# Patient Record
Sex: Female | Born: 1965
Health system: Western US, Academic
[De-identification: ages and names within clinical notes are randomized; demographics above are authoritative.]

## PROBLEM LIST (undated history)

## (undated) DIAGNOSIS — I10 Essential (primary) hypertension: Secondary | ICD-10-CM

## (undated) DIAGNOSIS — R7303 Prediabetes: Secondary | ICD-10-CM

## (undated) DIAGNOSIS — T7840XA Allergy, unspecified, initial encounter: Secondary | ICD-10-CM

## (undated) DIAGNOSIS — K219 Gastro-esophageal reflux disease without esophagitis: Secondary | ICD-10-CM

## (undated) HISTORY — DX: Prediabetes: R73.03

## (undated) HISTORY — DX: Essential (primary) hypertension: I10

## (undated) HISTORY — PX: CHOLECYSTECTOMY: SHX55

## (undated) HISTORY — DX: Allergy, unspecified, initial encounter: T78.40XA

## (undated) HISTORY — PX: HERNIA REPAIR: SHX51

## (undated) HISTORY — DX: Gastro-esophageal reflux disease without esophagitis: K21.9

## (undated) HISTORY — PX: SHOULDER ARTHROSCOPY W/ ROTATOR CUFF REPAIR: SHX2400

## (undated) HISTORY — PX: NOSE SURGERY: SHX723

## (undated) HISTORY — PX: PB RPR SPIGELIAN HERNIA: 49590

## (undated) HISTORY — PX: TUBAL LIGATION: SHX77

## (undated) MED ORDER — LACTATED RINGERS IV SOLN
INTRAVENOUS | Status: AC
Start: 2014-08-30 — End: ?

## (undated) MED ORDER — PB-HYOSCY-ATROPINE-SCOPOLAMINE 16.2 MG/5ML PO ELIX
10.00 mL | ORAL_SOLUTION | Freq: Once | ORAL | Status: AC
Start: 2014-05-21 — End: 2014-05-21

## (undated) MED ORDER — ALUM & MAG HYDROXIDE-SIMETH 200-200-20 MG/5ML OR SUSP
30.00 mL | Freq: Once | ORAL | Status: AC
Start: 2014-05-21 — End: 2014-05-21

## (undated) MED ORDER — LIDOCAINE VISCOUS 2 % MT SOLN
10.00 mL | Freq: Once | OROMUCOSAL | Status: AC
Start: 2014-05-21 — End: 2014-05-21

## (undated) MED ORDER — LIDOCAINE HCL 1 % IJ SOLN
0.10 mL | INTRAMUSCULAR | Status: AC | PRN
Start: 2014-08-30 — End: ?

---

## 2014-02-28 ENCOUNTER — Emergency Department
Admission: EM | Admit: 2014-02-28 | Discharge: 2014-02-28 | Disposition: A | Discharge status: Home / Self Care | Payer: Medicaid Other | Attending: Emergency Medicine | Admitting: Emergency Medicine

## 2014-02-28 DIAGNOSIS — R229 Localized swelling, mass and lump, unspecified: Principal | ICD-10-CM | POA: Insufficient documentation

## 2014-02-28 DIAGNOSIS — H5789 Other specified disorders of eye and adnexa: Secondary | ICD-10-CM

## 2014-02-28 DIAGNOSIS — I10 Essential (primary) hypertension: Secondary | ICD-10-CM | POA: Insufficient documentation

## 2014-02-28 DIAGNOSIS — T7840XA Allergy, unspecified, initial encounter: Secondary | ICD-10-CM

## 2014-02-28 MED ORDER — PREDNISONE 5 MG OR TABS
50.0000 mg | ORAL_TABLET | Freq: Once | ORAL | Status: DC
Start: 2014-02-28 — End: 2014-02-28

## 2014-02-28 MED ORDER — HYDROCORTISONE 0.5 % EX CREA
1.0000 | TOPICAL_CREAM | Freq: Two times a day (BID) | CUTANEOUS | Status: DC
Start: 2014-02-28 — End: 2015-02-27

## 2014-02-28 MED ORDER — DIPHENHYDRAMINE HCL 25 MG OR TABS OR CAPS CUSTOM
25.0000 mg | ORAL_CAPSULE | Freq: Four times a day (QID) | ORAL | Status: DC | PRN
Start: 2014-02-28 — End: 2015-02-27

## 2014-02-28 MED ORDER — DIPHENHYDRAMINE HCL 25 MG OR TABS OR CAPS CUSTOM
25.0000 mg | ORAL_CAPSULE | Freq: Once | ORAL | Status: AC
Start: 2014-02-28 — End: 2014-02-28
  Administered 2014-02-28: 25 mg via ORAL
  Filled 2014-02-28: qty 1

## 2014-02-28 NOTE — Discharge Instructions (Signed)
Allergic Reaction     You have been seen for an allergic reaction.     An allergic reaction is when your body reacts to something it comes in contact with. This can be something you ate. It can also be something that got on your skin or that you breathed in. Insect bites sometimes cause this reaction. Wasp, hornet and bee stings often cause this reaction.     Allergic reactions can cause a few things to happen. Some people get hives (large raised welts). Others get blistered skin. More serious reactions include swelling of the lips and/or tongue. They also include difficulty breathing. This can be with or without wheezing.     Often, the exact cause of the allergic reaction is never found.     If you find you are allergic to something, avoid it. Future allergic reactions could get much worse.     General treatment for an allergic reaction includes antihistamines like diphenhydramine (Benadryl®) or prescription strength hydroxyzine (Atarax®) and steroids. Some antacids also act like antihistamines. These include famotidine (Pepcid®), ranitidine (Zantac®) or cimetidine (Tagamet®). These are often used to help with the allergic reaction.     If you get a steroid prescription, it is important to finish the entire prescription. The allergic reaction can come back suddenly (rebound) if you suddenly stop the steroid too early.     YOU SHOULD SEEK MEDICAL ATTENTION IMMEDIATELY, EITHER HERE OR AT THE NEAREST EMERGENCY DEPARTMENT, IF ANY OF THE FOLLOWING OCCURS:  · Your lips or tongue get swollen.  · You have trouble breathing or start wheezing.  · Your rash seems to get infected. Signs of infection are skin redness, pain, pus, swelling or fever (temperature higher than 100.4ºF / 38ºC).

## 2014-02-28 NOTE — ED MD Progress Note (Signed)
Patient actually just given rx for hydrocortisone cream and benadryl.  She did not want a systemic steroid to take as risks/benefits were discussed.  OK to be discharged.

## 2014-02-28 NOTE — ED Provider Notes (Signed)
Emergency Department Note    The Date of Service for the Emergency Room encounter is 02/28/2014  2:56 PM     CC :   Chief Complaint   Patient presents with    Facial Swelling     pt arrived c/o L eye and facial swelling that started yesterday.  reports having an allergic reaction to tea tree oil that was used on scalp 4 days ago and states "i think i touched my face after touching my hair."  airway patent.  no breathing difficulty.       HPI :   48 year old female with history of hypertension who presents with left upper facial swelling s/p tree oil extract exposure.  Started using tree oil extract with her hair grease last week.  It was causing dryness and scabbing in her scalp and she was picking through her hair yesterday.  She rubbed some around her eye yesterday and since then with swelling around the left eye with mild itching. No real pain to that area, no pain in the eye directly.  No vision changes, no problems moving eyes around, no eye redness.  She has no fevers/chills, headache, chest pain, throat/neck swelling, chest pain, SOB, cough, abdominal pain, nausea/vomiting, diarrhea, or focal weakness/numbness/tingling in extremities.  She has taken benadryl intermittently, last use was early in the morning.      Past Medical History:   Hypertension    Past Surgical history:   None    Allergies:  Pcn    Social History:  Tobacco: no  Etoh: occassional  Illicit, IV, rx drug abuse: no  Living situation: has place to live    Family history:  None    Review of Systems:  All other systems reviewed and deemed negative      Physical Exam:   4    02/28/14  1204 02/28/14  1451   BP: 155/104 161/94   Pulse: 80 78   Temp: 98.4 F (36.9 C) 98.4 F (36.9 C)   Resp: 16 16   SpO2: 99% 100%       General: AOx3; NAD  HEENT: PERRL; EOMI; no conjunctival erythema; swelling to the lower eyelid and lateral upper eyelid   Heart: RRR; no murmurs  Lungs: CTAB  Abd: soft, non-distended; BS present; no tenderness to palpation  Ext:  no swelling in extremities  Skin: no rash, vesicles, drainage observed      Clinical Decision Making:  48 year old female with history of hypertension who presents with left upper facial swelling s/p tree oil extract exposure.  Likely had an allergic reaction to the oil extract.  She does not have anaphylaxis clinically.  There is no involvement of the eye.  Given her clinical history, do not believe swelling is 2/2 infection such as pre-orbital cellulitis.  Will give steroids and benadryl.  Discussed return if not getting better.    Case discussed with the attending, Dr. Nikki Dom, Debroah Loop, MD  Resident  02/28/14 1518    Daphene Jaeger, MD  02/28/14 2107

## 2014-03-15 NOTE — ED Follow-up Note (Signed)
Follow-up type: Callback       Routine ED Patient Call Back    Patient unable to be contacted, no message left

## 2014-05-21 ENCOUNTER — Inpatient Hospital Stay
Admission: EM | Admit: 2014-05-21 | Discharge: 2014-05-22 | DRG: 282 | Disposition: A | Discharge status: Home / Self Care | Payer: Medicaid Other | Attending: Internal Medicine | Admitting: Internal Medicine

## 2014-05-21 DIAGNOSIS — K859 Acute pancreatitis without necrosis or infection, unspecified: Secondary | ICD-10-CM | POA: Diagnosis present

## 2014-05-21 DIAGNOSIS — E876 Hypokalemia: Secondary | ICD-10-CM | POA: Diagnosis present

## 2014-05-21 DIAGNOSIS — I10 Essential (primary) hypertension: Secondary | ICD-10-CM | POA: Diagnosis present

## 2014-05-21 DIAGNOSIS — K760 Fatty (change of) liver, not elsewhere classified: Secondary | ICD-10-CM

## 2014-05-21 DIAGNOSIS — K8020 Calculus of gallbladder without cholecystitis without obstruction: Secondary | ICD-10-CM

## 2014-05-21 LAB — CBC WITH DIFF, BLOOD
ANC-Automated: 4.4 10*3/uL (ref 1.6–7.0)
Abs Eosinophils: 0.2 10*3/uL (ref 0.1–0.7)
Abs Lymphs: 2.5 10*3/uL (ref 0.8–3.1)
Abs Monos: 0.5 10*3/uL (ref 0.2–0.8)
Eosinophils: 2 % (ref 1–4)
Hct: 39.6 % (ref 34.0–45.0)
Hgb: 13.8 gm/dL (ref 11.2–15.7)
Lymphocytes: 33 % (ref 19–53)
MCH: 30.3 pg (ref 26.0–32.0)
MCHC: 34.8 % (ref 32.0–36.0)
MCV: 87 um3 (ref 79.0–95.0)
MPV: 9 fL — ABNORMAL LOW (ref 9.4–12.4)
Monocytes: 7 % (ref 5–12)
Plt Count: 341 10*3/uL (ref 140–370)
RBC: 4.55 10*6/uL (ref 3.90–5.20)
RDW: 12.4 % (ref 12.0–14.0)
Segs: 57 % (ref 34–71)
WBC: 7.7 10*3/uL (ref 4.0–10.0)

## 2014-05-21 LAB — URINALYSIS
Bilirubin: NEGATIVE
Blood: NEGATIVE
Glucose: NEGATIVE
Leuk Esterase: NEGATIVE
Nitrite: NEGATIVE
Specific Gravity: 1.033 — ABNORMAL HIGH (ref 1.002–1.030)
Urobilinogen: NEGATIVE
pH: 6 (ref 5.0–8.0)

## 2014-05-21 LAB — COMPREHENSIVE METABOLIC PANEL, BLOOD
ALT (SGPT): 450 U/L — ABNORMAL HIGH (ref 0–33)
AST (SGOT): 227 U/L — ABNORMAL HIGH (ref 0–32)
Albumin: 4.5 g/dL (ref 3.5–5.2)
Alkaline Phos: 190 U/L — ABNORMAL HIGH (ref 35–140)
Anion Gap: 13 mmol/L (ref 7–15)
BUN: 12 mg/dL (ref 6–20)
Bicarbonate: 31 mmol/L — ABNORMAL HIGH (ref 22–29)
Bilirubin, Tot: 0.32 mg/dL (ref <1.20)
Calcium: 10 mg/dL (ref 8.5–10.6)
Chloride: 98 mmol/L (ref 98–107)
Creatinine: 0.59 mg/dL (ref 0.51–0.95)
GFR: >60 mL/min
Glucose: 97 mg/dL (ref 70–115)
Potassium: 3.4 mmol/L — ABNORMAL LOW (ref 3.5–5.1)
Sodium: 142 mmol/L (ref 136–145)
Total Protein: 7.8 g/dL (ref 6.0–8.0)

## 2014-05-21 LAB — LIPASE, BLOOD: Lipase: 1012 U/L — ABNORMAL HIGH (ref 13–60)

## 2014-05-21 MED ORDER — LIDOCAINE VISCOUS 2 % MT SOLN
10.0000 mL | Freq: Once | OROMUCOSAL | Status: AC
Start: 2014-05-21 — End: 2014-05-21
  Administered 2014-05-21: 10 mL via ORAL
  Filled 2014-05-21: qty 15

## 2014-05-21 MED ORDER — ALUM & MAG HYDROXIDE-SIMETH 200-200-20 MG/5ML OR SUSP
30.0000 mL | Freq: Once | ORAL | Status: AC
Start: 2014-05-21 — End: 2014-05-21
  Administered 2014-05-21: 30 mL via ORAL
  Filled 2014-05-21: qty 30

## 2014-05-21 MED ORDER — SODIUM CHLORIDE 0.9 % IV BOLUS
1000.0000 mL | INJECTION | Freq: Once | INTRAVENOUS | Status: AC
Start: 2014-05-21 — End: 2014-05-22
  Administered 2014-05-22: 1000 mL via INTRAVENOUS

## 2014-05-21 MED ORDER — ONDANSETRON 4 MG OR TBDP
4.0000 mg | ORAL_TABLET | Freq: Once | ORAL | Status: AC
Start: 2014-05-21 — End: 2014-05-21
  Administered 2014-05-21: 4 mg via ORAL
  Filled 2014-05-21: qty 1

## 2014-05-21 MED ORDER — PB-HYOSCY-ATROPINE-SCOPOLAMINE 16.2 MG/5ML PO ELIX
10.0000 mL | ORAL_SOLUTION | Freq: Once | ORAL | Status: AC
Start: 2014-05-21 — End: 2014-05-21
  Administered 2014-05-21: 10 mL via ORAL
  Filled 2014-05-21: qty 10

## 2014-05-21 MED ORDER — ONDANSETRON HCL 4 MG/2ML IV SOLN
4.0000 mg | Freq: Four times a day (QID) | INTRAMUSCULAR | Status: AC | PRN
Start: 2014-05-21 — End: 2014-05-22
  Administered 2014-05-21: 4 mg via INTRAVENOUS
  Filled 2014-05-21: qty 2

## 2014-05-21 MED ORDER — HYDROMORPHONE HCL 1 MG/ML IJ SOLN
1.0000 mg | INTRAMUSCULAR | Status: AC | PRN
Start: 2014-05-21 — End: 2014-05-22
  Filled 2014-05-21: qty 1

## 2014-05-21 NOTE — ED EKG Interpretation (Signed)
ED EKG Interpretation  Sinus @ 77bpm, normal axis, PR interval , QRS duration 30ms, QTc=462ms; TWI in III and T-wave flattening in aVF/V3/V4/V5/V6; No ST elevations/depressions

## 2014-05-21 NOTE — ED Notes (Signed)
Rainbow sent to lab

## 2014-05-21 NOTE — ED Notes (Signed)
12 lead EKG preformed. A copy was placed in the patient's chart and a second copy was handed to Dr. Minns.

## 2014-05-21 NOTE — ED EKG Interpretation (Signed)
ED EKG Interpretation    EKG: NSR, VR 77bpm, no axis deviation, PR and QRS wnl, QTc , no acute ST changes or TWI's, Q-waves in anterior leads, no previous ekg for comparison

## 2014-05-21 NOTE — ED Notes (Signed)
Pt to ultrasound via gurney.

## 2014-05-21 NOTE — ED Notes (Signed)
To ultrasound via gurney.

## 2014-05-21 NOTE — ED Notes (Signed)
Pt provided with crackers, 1 juice box and water. Okay by MD Mummert.

## 2014-05-21 NOTE — ED Notes (Signed)
Bed: 27  Expected date:   Expected time:   Means of arrival:   Comments:  Hold for 4B

## 2014-05-21 NOTE — ED Notes (Signed)
Report was given to P. Reeder Charity fundraiser.

## 2014-05-22 ENCOUNTER — Encounter (HOSPITAL_COMMUNITY): Payer: Self-pay | Admitting: Internal Medicine

## 2014-05-22 DIAGNOSIS — I10 Essential (primary) hypertension: Secondary | ICD-10-CM

## 2014-05-22 DIAGNOSIS — K859 Acute pancreatitis, unspecified: Principal | ICD-10-CM

## 2014-05-22 LAB — COMPREHENSIVE METABOLIC PANEL, BLOOD
ALT (SGPT): 289 U/L — ABNORMAL HIGH (ref 0–33)
AST (SGOT): 85 U/L — ABNORMAL HIGH (ref 0–32)
Albumin: 3.8 g/dL (ref 3.5–5.2)
Alkaline Phos: 147 U/L — ABNORMAL HIGH (ref 35–140)
Anion Gap: 14 mmol/L (ref 7–15)
BUN: 8 mg/dL (ref 6–20)
Bicarbonate: 25 mmol/L (ref 22–29)
Bilirubin, Tot: 0.47 mg/dL (ref <1.20)
Calcium: 8.4 mg/dL — ABNORMAL LOW (ref 8.5–10.6)
Chloride: 103 mmol/L (ref 98–107)
Creatinine: 0.56 mg/dL (ref 0.51–0.95)
GFR: >60 mL/min
Glucose: 99 mg/dL (ref 70–115)
Potassium: 2.9 mmol/L — ABNORMAL LOW (ref 3.5–5.1)
Sodium: 142 mmol/L (ref 136–145)
Total Protein: 6.7 g/dL (ref 6.0–8.0)

## 2014-05-22 LAB — BASIC METABOLIC PANEL, BLOOD
Anion Gap: 11 mmol/L (ref 7–15)
BUN: 9 mg/dL (ref 6–20)
Bicarbonate: 28 mmol/L (ref 22–29)
Calcium: 9.2 mg/dL (ref 8.5–10.6)
Chloride: 102 mmol/L (ref 98–107)
Creatinine: 0.6 mg/dL (ref 0.51–0.95)
GFR: >60 mL/min
Glucose: 117 mg/dL — ABNORMAL HIGH (ref 70–115)
Potassium: 3.8 mmol/L (ref 3.5–5.1)
Sodium: 141 mmol/L (ref 136–145)

## 2014-05-22 LAB — LIVER PANEL, BLOOD
ALT (SGPT): 238 U/L — ABNORMAL HIGH (ref 0–33)
AST (SGOT): 54 U/L — ABNORMAL HIGH (ref 0–32)
Albumin: 3.8 g/dL (ref 3.5–5.2)
Alkaline Phos: 133 U/L (ref 35–140)
Bilirubin, Dir: <0.2 mg/dL (ref <0.2)
Bilirubin, Tot: 0.2 mg/dL (ref <1.20)
Total Protein: 6.7 g/dL (ref 6.0–8.0)

## 2014-05-22 LAB — CBC WITH DIFF, BLOOD
ANC-Automated: 3.8 10*3/uL (ref 1.6–7.0)
Abs Eosinophils: 0.2 10*3/uL (ref 0.1–0.7)
Abs Lymphs: 2.5 10*3/uL (ref 0.8–3.1)
Abs Monos: 0.5 10*3/uL (ref 0.2–0.8)
Eosinophils: 2 % (ref 1–4)
Hct: 38.5 % (ref 34.0–45.0)
Hgb: 12.9 gm/dL (ref 11.2–15.7)
Lymphocytes: 35 % (ref 19–53)
MCH: 29.5 pg (ref 26.0–32.0)
MCHC: 33.5 % (ref 32.0–36.0)
MCV: 88.1 um3 (ref 79.0–95.0)
MPV: 9.5 fL (ref 9.4–12.4)
Monocytes: 7 % (ref 5–12)
Plt Count: 318 10*3/uL (ref 140–370)
RBC: 4.37 10*6/uL (ref 3.90–5.20)
RDW: 12.7 % (ref 12.0–14.0)
Segs: 55 % (ref 34–71)
WBC: 7 10*3/uL (ref 4.0–10.0)

## 2014-05-22 LAB — LIPASE, BLOOD: Lipase: 258 U/L — ABNORMAL HIGH (ref 13–60)

## 2014-05-22 LAB — TRIGLYCERIDES, BLOOD
Triglycerides: 104 mg/dL (ref 10–170)
Triglycerides: 105 mg/dL (ref 10–170)

## 2014-05-22 MED ORDER — NALOXONE HCL 0.4 MG/ML IJ SOLN
0.1000 mg | INTRAMUSCULAR | Status: DC | PRN
Start: 2014-05-22 — End: 2014-05-22

## 2014-05-22 MED ORDER — ONDANSETRON HCL 4 MG/2ML IV SOLN
4.0000 mg | Freq: Four times a day (QID) | INTRAMUSCULAR | Status: DC | PRN
Start: 2014-05-22 — End: 2014-05-22

## 2014-05-22 MED ORDER — MORPHINE SULFATE 2 MG/ML IJ SOLN
2.0000 mg | INTRAMUSCULAR | Status: DC | PRN
Start: 2014-05-22 — End: 2014-05-22
  Administered 2014-05-22: 2 mg via INTRAVENOUS

## 2014-05-22 MED ORDER — SODIUM CHLORIDE 0.9 % IJ SOLN (CUSTOM)
3.0000 mL | INTRAMUSCULAR | Status: DC | PRN
Start: 2014-05-22 — End: 2014-05-22

## 2014-05-22 MED ORDER — SODIUM CHLORIDE 0.9 % IV SOLN
INTRAVENOUS | Status: DC
Start: 2014-05-22 — End: 2014-05-22
  Administered 2014-05-22 (×3): via INTRAVENOUS

## 2014-05-22 MED ORDER — MORPHINE SULFATE 4 MG/ML IJ SOLN
4.0000 mg | INTRAMUSCULAR | Status: DC | PRN
Start: 2014-05-22 — End: 2014-05-22

## 2014-05-22 MED ORDER — SODIUM CHLORIDE 0.9 % IV BOLUS
1000.0000 mL | INJECTION | Freq: Once | INTRAVENOUS | Status: AC
Start: 2014-05-22 — End: 2014-05-22
  Administered 2014-05-22: 1000 mL via INTRAVENOUS

## 2014-05-22 MED ORDER — SODIUM CHLORIDE 0.9 % IJ SOLN (CUSTOM)
3.0000 mL | Freq: Three times a day (TID) | INTRAMUSCULAR | Status: DC
Start: 2014-05-22 — End: 2014-05-22

## 2014-05-22 MED ORDER — SODIUM CHLORIDE 0.9% TKO INFUSION
INTRAVENOUS | Status: DC | PRN
Start: 2014-05-22 — End: 2014-05-22

## 2014-05-22 MED ORDER — POTASSIUM CHLORIDE CRYS CR 10 MEQ OR TBCR
60.0000 meq | EXTENDED_RELEASE_TABLET | Freq: Once | ORAL | Status: AC
Start: 2014-05-22 — End: 2014-05-22
  Administered 2014-05-22: 60 meq via ORAL
  Filled 2014-05-22: qty 6

## 2014-05-22 MED ORDER — MORPHINE SULFATE 2 MG/ML IJ SOLN
2.0000 mg | INTRAMUSCULAR | Status: DC | PRN
Start: 2014-05-22 — End: 2014-05-22
  Filled 2014-05-22: qty 1

## 2014-05-22 MED ORDER — HYDROCHLOROTHIAZIDE 50 MG OR TABS
50.00 mg | ORAL_TABLET | Freq: Every day | ORAL | Status: DC
Start: ? — End: 2014-05-22

## 2014-05-22 NOTE — Plan of Care (Signed)
Problem: Discharge Planning  Goal: Participation in care planning  Outcome: Met  Labs redrawn and K improved from this am from 2.9 to 3.8. MD notified and d/c ordered. AVS provided to and reviewed with patient which included reason for admission, care and procedures provided while admitted, diet and activity to follow once discharged, s/s to be aware of and when to seek medical attention immediately, importance of f/u with general surgery clinic re: gallstones and f/u with PCP as discharge instructions state to d/c HCTZ. Pt verbalized understanding of all instructions. PIV removed. Pt states husband will be able to provide transportation home this evening.

## 2014-05-22 NOTE — Progress Notes (Signed)
Daily Progress Note:  05/22/2014     Current Hospital Stay:   1 day - Admitted on: 05/21/2014    Subjective:  Pt says she feels much better today.  Tolerating liquids then solids for lunch.  No abd pain, n/v, dyspnea.      Objective:    Vital Signs:  Temperature:  [97.1 F (36.2 C)-98.5 F (36.9 C)] 98.1 F (36.7 C) (03/12 0711)  Blood pressure (BP): (120-150)/(60-81) 125/78 mmHg (03/12 0711)  Heart Rate:  [62-94] 62 (03/12 0711)  Respirations:  [14-18] 18 (03/12 0711)  Pain Score:  [-] 0 (03/12 0711)  O2 Device:  [-] None (Room air) (03/12 0442)  SpO2:  [97 %-100 %] 97 % (03/12 0711)    Wt Readings from Last 1 Encounters:   05/22/14 103.874 kg (229 lb)       Intake/Output (Current Shift):         Physical Exam:  Psych; AAOx3, NAD   HENT:  mmm   Heart: regular rate, no murmurs appreciated   Lungs: Clear to ascultation bilaterally, symmetric rise  Abdomen: Soft, mild epigastric pain, +BS  Skin: warm, dry, no rashes     Laboratory data:   Lab Results   Component Value Date    NA 142 05/22/2014    K 2.9* 05/22/2014    CL 103 05/22/2014    BICARB 25 05/22/2014    BUN 8 05/22/2014    CREAT 0.56 05/22/2014    GLU 99 05/22/2014    Mertens 8.4* 05/22/2014     Lab Results   Component Value Date    WBC 7.0 05/22/2014    HGB 12.9 05/22/2014    HCT 38.5 05/22/2014    PLT 318 05/22/2014    SEG 55 05/22/2014    LYMPHS 35 05/22/2014    MONOS 7 05/22/2014    EOS 2 05/22/2014     Lab Results   Component Value Date    AST 85* 05/22/2014    ALT 289* 05/22/2014    ALK 147* 05/22/2014    TBILI 0.47 05/22/2014    TP 6.7 05/22/2014    ALB 3.8 05/22/2014     No results found for: INR, PTT  Lab Results   Component Value Date    PHUA 6.0 05/21/2014    SGUA 1.033* 05/21/2014    GLUCOSEUA Negative 05/21/2014    KETONEUA Trace* 05/21/2014    BLOODUA Negative 05/21/2014    PROTEINUA 1+* 05/21/2014    LEUKESTUA Negative 05/21/2014    NITRITEUA Negative 05/21/2014    WBCUA 0-2 05/21/2014    RBCUA 0-2 05/21/2014       Assessment and Plan:  1.  Pancreatitis - unclear etiology, possibly passed gallstone vs hctz.  No etoh use.  triglycerides and [place] normal.  - abd [place] without biliary obstruction or cholangitis, noted +cholelithiasis.  Consider surgery referral after dc  - cont to trend liver enzymes (currently downtrending)  - cont zofran, pain control  - dc ivf if cont to tolerate po diet     2.HTN  Controlled  Hold HCTZ for now    3. Hypokalemia: supplemented today, f/u pm basic    4.DVT ppx  Ambulatory    Dispo: likely dc home tomorrow pending improvement in labs and symptoms

## 2014-05-22 NOTE — ED Floor Report (Signed)
ED to IP Handoff    Report created by Rosalie Gums at 1:00 AM 05/22/2014.     HANDOFF REPORT UPDATE/CHANGES (changes in patient status/care/events prior to transfer)  By who:  Time:   Additional information:                                                                                                                                                     Teresa [name] is a 49 year old female.    Brief Summary of ED Visit (to include focused assessment and neuro status):  Teresa [name] is a 49 year old woman h/o HTN presents to ED with epigastric pain, nausea for the past 5 days. Worse when she eats. . In Ed pt pain improved with GI cocktail but wu reveal LFT elevation and lipase >1000. Pt denies recent etoh use. usuallly drinks wine 1-2 glasses 2-3 times/month. No prior h/o pancreatitis. Patient offered Dilaudid and refused, stating she "doesn't have pain." Given 2 liters NS fluids in ER.    RN shift assessment exceptions to WDL: abdominal pain in triage.    Any significant events and interventions with responses:  Pain managed with GI cocktail    Radiologic studies not completed: none  (None unless otherwise noted)    Chief Complaint   Patient presents with    Abdominal Pain     epigastric pain, burning, worse with inspiration "it feels like indigestion".  also reports feeling bloated.  +nausea, -sob.  denies cp       Admitted for: pancreatitis    Code Status:  Please refer to In-pt admitting doctors orders     Level of Care: med surg     Is patient on Heparin? no If yes, complete below:     Time Heparin bolus was given: na    Additional drips patient is on: NS bolus     Cardiac rhythm: na    Oxygen Delivery: None    No past medical history on file.    No past surgical history on file.    Allergies: Pcn    ED Fall Risk: No    Skin issues:  no    >> If yes, note areas of skin breakdown. See appropriate photos.      Ambulatory:  yes    Sitter needed: no    Suicide Risk:  no    Isolation Required: no     >> If yes  , what type of isolation: na    Is patient in custody?  no    Is patient in restraints? no    Filed Vitals:    05/21/14 1540 05/21/14 1837 05/21/14 2138 05/22/14 0054   BP: 132/78 150/81 135/81 117/82   Pulse: 94 74 69 66   Temp: 98.5 F (36.9 C)  97.1 F (36.2 C)  Resp: 16 14 16 16    Height:       Weight:       SpO2: 100% 100% 98% 100%       Lab Results   Component Value Date    WBC 7.7 05/21/2014    RBC 4.55 05/21/2014    HGB 13.8 05/21/2014    HCT 39.6 05/21/2014    MCV 87.0 05/21/2014    MCHC 34.8 05/21/2014    RDW 12.4 05/21/2014    PLT 341 05/21/2014    MPV 9.0* 05/21/2014       Lab Results   Component Value Date    NA 142 05/21/2014    K 3.4* 05/21/2014    CL 98 05/21/2014    BICARB 31* 05/21/2014    BUN 12 05/21/2014    CREAT 0.59 05/21/2014    GLU 97 05/21/2014    Hanson 10.0 05/21/2014       No results found for: BNP, PHOS, MG, LACTATE, AMMONIA, IONCA, ARTIONCA    No results found for: CPK, CKMBH, TROPONIN    No results found for: PH, PCO2, O2CONTENT, IVHC3, IVBE, O2SAT, UNPH, UNPCO2, ARTPH, ARTPCO2, ARTO2CNT, IAHC3, IABE, ARTO2SAT, UNAPH, UNAPCO2    No results found for this visit on 05/21/14.      Patient Lines/Drains/Airways Status    Active PICC Line / CVC Line / PIV Line / Drain / Airway / Intraosseous Line / Epidural Line / ART Line / Line Type / Wound     Name: Placement date: Placement time: Site: Days:    Peripheral IV - 20 G Right Antecubital 05/22/14  0005  Antecubital  less than 1                    Floor nurse informed that report is ready for review.  Opportunity to answer questions with floor RN face to face or by phone. ER number is [id number] . ER RN Rosalie Gums to be contacted for any questions.

## 2014-05-22 NOTE — Discharge Instructions (Signed)
Diagnosis and Reason for Admission    You were admitted to the hospital for the following reason(s):  Acute pancreatitis    Your full diagnosis list is located on this After Visit Summary in the Hospital Problems section.    What Happened During Your Hospital Stay    The main tests and treatments done for you during this hospitalization were:    - ultrasound of abdomen  - intravenous fluids    The following evaluation is still important to complete after discharge from the hospital:  - stop hydrochlorothiazide, follow up with your primary care doctor to discuss need for different hypertension medications  - It is possible that gallstones may have caused this episode of pancreatitis. Follow up with your primary care doctor and possibly general surgery for gallstones.     Instructions for After Discharge    Your diet at home should be a regular diet.    Your activity level at home should be:  regular activity.    Specific activity restrictions:    None    Wound or tube care instructions:  None    Your medication list is located on this After Visit Summary in the Current Discharge Medication List section.  Your nurse will review this information with you before you leave the hospital.    It is very important for you to keep a current medication list with you in order to assist your doctors with your medical care.  Bring this After Visit Summary with you to your follow up appointments.    Reasons to Contact a Doctor Urgently    Call 911 or return to the hospital immediately if:  You have chest pain, difficulty breathing, recurrence of severe abdominal pain, or other new issues    You should contact either your primary care physician or your hospital physician for any of the following reasons: questions on medications or follow up    If you have any questions about your hospital care, your medications, or if you have new or concerning symptoms soon after going home from the hospital, and you need to contact your hospital  physician, your hospital physician can be contacted in the following manner:  [facility] operator at [phone].    Once you are able to see your primary care physician (PCP), your PCP will then be responsible for further medication refills, or appointment referrals.    What Needs to Happen Next After Discharge -- Appointments and Follow Up    Any appointments already scheduled at [facility] clinics will be listed in the Future Appointments section at the top of this After Visit Summary.  Any appointments that have been requested, but have not yet been scheduled, will be listed below that under Post Discharge Referrals.    Sometimes tests performed in the hospital do not yet have results by the time a patient goes home.  The following key tests will need to be followed up at your next appointment: None    Medical Home Information    Your primary care provider or clinic currently on file at [facility] is: Teresa [name]    Handouts Given to You (if applicable)

## 2014-05-22 NOTE — Plan of Care (Signed)
Text paged Kathy Breach MD at [phone] with following:    Pt [name], Teresa Bradford. Pt able to tolerate full liquid diet for breakfast. Can she be advanced to regular? thank you.

## 2014-05-22 NOTE — Plan of Care (Signed)
Text paged Kathy Breach MD at [phone] with following:    Pt Teresa [name], Cala Bradford. Labs drawn and K 3.8 and ALT/AST 238/54. Pt requesting if labs ok if she can d/c tonight. She states her husband will be able to provide transportation home between 8-9 this evening.

## 2014-05-22 NOTE — Plan of Care (Signed)
Problem: Discharge Planning  Goal: Participation in care planning  Outcome: Met  No discharge order yet but pt participated and involved in planning. Pt hypokalemic this am with K 2.9. Pt received 60 meq po with repeat BMP order for this evening at 1800. Compliant with POC.

## 2014-05-22 NOTE — Plan of Care (Signed)
Problem: Pain - Acute  Goal: Communication of presence of pain  Outcome: Met  Pt with no c/o pain throughout shift. Pt made aware of availability of pain medication and to call when needed. Pt verbalized understanding. Will continue to monitor.

## 2014-05-22 NOTE — H&P (Signed)
HISTORY AND PHYSICAL    Attending MD:   Karlene Lineman, MD    Chief Complaint:  Abdominal pain    Pain Assessment:  The patient endorses pain as 8 out of 10, located epigastric, and described as sharp.    History of Present Illness:     Teresa [name] is a 49 year old woman h/o HTN presents to ED with epigastric pain, nause for the past 5 days. Worse when she eats. Now presenting to ED for wu.  In Ed pt pain improved with GI cocktail but wu reveal LFT elevation and lipase >1000.  Pt denies recent etoh use. usuallly drinks wine 1-2 glasses 2-3 times/month. No prior h/o pancreatitis.  No ruq pain, no fevers, chills. No h/o GB dz, no surgical hx.      Past Medical and Surgical History:  No past medical history on file.  No past surgical history on file.    Allergies:  Allergies   Allergen Reactions    Pcn [Penicillins] Unspecified       Medications:    (Not in a hospital admission)    Social History:  History     Social History    Marital Status: Married     Spouse Name: N/A    Number of Children: N/A    Years of Education: N/A     Social History Main Topics    Smoking status: Former Smoker    Smokeless tobacco: Not on file    Alcohol Use: Not on file    Drug Use: Not on file    Sexual Activity: Not on file     Other Topics Concern    Not on file     Social History Narrative    No narrative on file       Family History:  Reviewed and noncontributory    Review of Systems:  12 point ROS have been reviewed and are otherwise negative except as stated here or in the HPI      Physical Exam:  BP 135/81 mmHg   Pulse 69   Temp(Src) 97.1 F (36.2 C)   Resp 16   Ht $R'5\' 6"'gv$  (1.676 m)   Wt 72.576 kg (160 lb)   BMI 25.84 kg/m2   SpO2 98%   LMP 03/22/2014  Psych; AAOx3, NAD, good insight/judgement  HENT: NCAT, mmm, good dentition  Eyes:PERRL, conjunctivae clear  Heart: regular, no murmurs appreciated, no heave  Lungs: Clear to ascultation bilaterally, symmetric rise  Abdomen: Soft, mild epigastric pain, +BS  Skin: warm,  dry, no rashes   Lymph: no submadibular, supraclavicular LAD        Labs and Other Data:  Lab Results   Component Value Date    NA 142 05/21/2014    K 3.4* 05/21/2014    CL 98 05/21/2014    BICARB 31* 05/21/2014    BUN 12 05/21/2014    CREAT 0.59 05/21/2014    GLU 97 05/21/2014    Pablo 10.0 05/21/2014     Lab Results   Component Value Date    WBC 7.7 05/21/2014    HGB 13.8 05/21/2014    HCT 39.6 05/21/2014    PLT 341 05/21/2014    SEG 57 05/21/2014    LYMPHS 33 05/21/2014    MONOS 7 05/21/2014    EOS 2 05/21/2014     Lab Results   Component Value Date    AST 227* 05/21/2014    ALT 450* 05/21/2014    ALK 190*  05/21/2014    TBILI 0.32 05/21/2014    TP 7.8 05/21/2014    ALB 4.5 05/21/2014     No results found for: INR, PTT  Lab Results   Component Value Date    PHUA 6.0 05/21/2014    SGUA 1.033* 05/21/2014    GLUCOSEUA Negative 05/21/2014    KETONEUA Trace* 05/21/2014    BLOODUA Negative 05/21/2014    PROTEINUA 1+* 05/21/2014    LEUKESTUA Negative 05/21/2014    NITRITEUA Negative 05/21/2014    WBCUA 0-2 05/21/2014    RBCUA 0-2 05/21/2014     Case discussed with ED physician at time of admission  Old records requested per epic and reviewed    Assessment and Care Plan:  1. Pancreatitis  ? Etiology  Hold hctz  F/u abdominal US  Consider GI consult in AM  Check triglycerides  Zofran, dialudid prn  Cont IVF    2.HTN  Controlled  Hold HCTZor now    3.DVT ppx  ambulatory      This plan and alternatives have been discussed with the patient and/or surrogate.    Code Status:  Full Code    The patient's primary care physician or clinic has not been contacted regarding this admission.    Note Author: Karlene Lineman, 05/22/2014, 12:11 AM      .

## 2014-05-22 NOTE — Plan of Care (Signed)
Hand off report given to Angela RN

## 2014-05-22 NOTE — ED Provider Notes (Signed)
Emergency Department Provider Note    Patient: Teresa [name], MRN [id number], DOB 1966/01/16  The Date of Service for the Emergency Room encounter is 05/21/2014  8:04 PM   Chief Complaint   Patient presents with    Abdominal Pain     epigastric pain, burning, worse with inspiration "it feels like indigestion".  also reports feeling bloated.  +nausea, -sob.  denies cp       HPI:   49 year old female generally healthy here with epigastric pain, intermittent x2-3 days, worse with meals, burning and sharp. + nausea no vomiting. No fevers/chills. +constipation due to taking opioids for recent shoulder surgery, last BM yesterday, +passing gas. No melena/hematochezia. No dysuria/hematuria. No vaginal bleeding/discharge. No chest pain, shortness of breath, cough.   No new foods, recent travel, sick contacts  Denies heavy EtOH or nsaids  Has prior history of reflux, hasn't been taking meds for 3-4 years, states this is worse than that       Review of Systems:  Constitutional: negative for unexpected weight loss  CV: negative for syncope  Resp: negative for hemoptysis  GI: negative for hematochezia  GU: negative for incontinence  Endocrine: negative for polydipsia  Integumentary: negative for jaundice  Neuro: negative for seizures  Psych: negative for hallucinations  Heme: negative for abnormal bruising    Home Medications:   Prior to Admission Medications   Prescriptions Last Dose Informant Patient Reported? Taking?   diphenhydrAMINE (BENADRYL) 25 MG tablet Unknown  No No   Sig: Take 1 tablet (25 mg) by mouth every 6 hours as needed for Itching.   hydrochlorothiazide (HYDRODIURIL) 50 MG tablet Unknown  Yes No   Sig: Take 50 mg by mouth daily.   hydrocortisone 0.5 % cream Unknown  No No   Sig: Apply 1 Application topically 2 times daily. Use a small amount as directed      Facility-Administered Medications: None       Allergies: Reviewed    Past Medical History: Denies bleeding disorders    Past Surgical History: Denies recent  surgeries     Family History: Denies family history of bleeding disorders    Social History: Denies regular use of tobacco, EtOH, or other drug use       Physical Exam  BP 120/60 mmHg   Pulse 66   Temp(Src) 97.7 F (36.5 C)   Resp 16   Ht 5\' 6"  (1.676 m)   Wt 103.874 kg (229 lb)   BMI 36.98 kg/m2   SpO2 100%   LMP 03/22/2014    Physical Exam:  General: Alert, oriented x3. Non-toxic, no acute resp distress.   HENT:   Head: No laceration or hematoma noted  Mouth/Throat: Oropharynx clear. Moist membranes.   Eyes: Conjugate gaze. White conjunctiva.   Neck: Trachea midline, no stridor. No JVD  CV: Normal rate, regular rhythm. No murmurs/rubs/gallops. Distal pulses palpable.   Pulm: Effort normal. Speaking full sentences. Lungs equal and clear bilaterally, no wheezes/rales/rhonchi    Chest: No tenderness or ecchymosis/crepitus   Abd: Soft. No distension. +epigastric tenderness, negative murphy, no rebound/guarding  Back: Normal alignment. No CVA tenderness   MSK:  No deformity. No edema.   Neuro: Alert, oriented x3. No facial droop. No dysarthria. Moving all extremities. Sensation intact x4 extremities.   Skin: No pallor or jaundice          Clinical Decision Making   Pt is a 49 year old female with epigastric pain. Consider VGE, gastritis, GERD, less likely  PUD, pancreatitis or biliary colic. abdominal exam benign doubt cholecystitis, cholangitis, perf, appy, sbo, abscess. Doubt atypical ACS but will check ekg. No symptoms of GI bleed.     ED Work-Up and Course   No leukocytosis or anemia   Normal creatinine   Lipase elevated consistent with pancreatitis. Pt denies EtOH  + mild lft elevations, consider gallstones, u/s pending  IVF, analgesia, anti-emetics    Disposition:  Admitted to medicine for further workup and management   HD stable while in ED    Discussed with Dr. Shelda Jakes, Janey Genta, MD  Resident  05/22/14 1610    Myrtice Lauth, MD  05/22/14 [phone]

## 2014-05-22 NOTE — Plan of Care (Signed)
Received pt from ED with NS 1L IV bolus still running. Once done, will hang another 1L of NS to run at 250 cc/hr.

## 2014-05-22 NOTE — Plan of Care (Signed)
Problem: Pain - Acute  Goal: Communication of presence of pain  Outcome: Met  Pt has so far denied any pain. She is hoping to go home tomorrow. Will continue to observe.    Problem: Discharge Planning  Goal: Participation in care planning  Outcome: Not Met  Pt is a new admit, from home.

## 2014-05-23 LAB — ECG 12-LEAD
ATRIAL RATE: 77 {beats}/min
ECG INTERPRETATION: NORMAL
P AXIS: 48 degrees
PR INTERVAL: 150 ms
QRS INTERVAL/DURATION: 78 ms
QT: 386 ms
QTC INTERVAL: 436 ms
R AXIS: 10 degrees
T AXIS: -9 degrees
VENTRICULAR RATE: 77 {beats}/min

## 2014-05-23 NOTE — Discharge Summary (Signed)
Date of Admission:  05/21/2014  Date of Discharge:  05/22/2014    Patient Name:  Teresa [name]    Principal Diagnosis (required):  acute pancreatitis    Hospital Problem List (required):  Active Hospital Problems    Diagnosis    Essential hypertension [I10]    Pancreatitis [facility]) [K85.9]      Resolved Hospital Problems    Diagnosis   No resolved problems to display.       Additional Hospital Diagnoses ("rule out" or "suspected" diagnoses, etc.):  None    Principal Procedure During This Hospitalization (required):  Ultrasound of abdomen    Other Procedures Performed During This Hospitalization (required):  None    Procedure results are available in Chart Review in Epic.  For those providers external to [facility], the key procedure results are listed below:    US abdomen  The liver measures 15.0 cm in long axis. It is hyperechoic in echogenicity. .   There is no intra or extra hepatic bile duct dilation. The common bile duct   measures 2 mm. The gallbladder contains several calculi but no pericholecystic  fluid or sonographic Murphy sign.  The right kidney measures 10.4 cm in long axis. The right kidney shows no   evidence of hydronephrosis or renal calculi.  The visualized portion of the pancreas is within normal limits.  No ascites is seen.  The visualized aorta and inferior vena cava are within normal limits.  IMPRESSION:  Cholelithiasis with no evidence of acute cholecystitis.    Consultations Obtained During This Hospitalization:  None    Key consultant recommendations:    Reason for Admission to the Hospital / History of Present Illness:  Teresa [name] is a 49 year old woman h/o HTN presents to ED with epigastric pain, nausea for the past 5 days. Worse when she eats. Now presenting to ED for wu. In Ed pt pain improved with GI cocktail but wu reveal LFT elevation and lipase >1000. Pt denies recent etoh use. usuallly drinks wine 1-2 glasses 2-3 times/month. No prior h/o pancreatitis. No ruq pain, no fevers, chills. No  h/o GB dz, no surgical hx.    Hospital Course by Problem (required):  1. Pancreatitis - unclear etiology, possibly passed gallstone vs hctz. No etoh use. triglycerides and [place] normal.  - abd [place] without biliary obstruction or cholangitis, noted +cholelithiasis. surgery referral after dc  - cont to trend liver enzymes - downtrending on day 2 of admission, alk phos normalized. Should f/u w/pcp to ensure resolution  - cont zofran, pain control -> did not require meds during admission  - diet advanced on day 2 of admission, pt was tolerating regular diet without pain or nausea by day of dc    2.HTN  Controlled  Hold HCTZ for now - pt to discuss alternatives with PCP after dc    3. Hypokalemia: resolved with supplements    Tests Outstanding at Discharge Requiring Follow Up:  None    Discharge Condition (required):  Improved.    Key Physical Exam Findings at Discharge:  No significant physical examination findings at the time of discharge.    Discharge Diet:  Regular.    Discharge Medications:     What To Do With Your Medications      CONTINUE taking these medications       Add'l Info    diphenhydrAMINE 25 MG tablet   Commonly known as:  BENADRYL   Take 1 tablet (25 mg) by mouth every 6 hours as  needed for Itching.    Quantity:  30 tablet   Refills:  0       hydrocortisone 0.5 % cream   Apply 1 Application topically 2 times daily. Use a small amount as directed    Quantity:  1 Tube   Refills:  0         STOP taking these medications          hydrochlorothiazide 50 MG tablet   Commonly known as:  HYDRODIURIL             Allergies:  Allergies   Allergen Reactions    Pcn [Penicillins] Unspecified       Discharge Disposition:  Home.    Discharge Code Status:  Full code / full care  This code status is not changed from the time of admission.    Follow Up Appointments:    Scheduled appointments:  No future appointments.    For appointments requested for after discharge that have not yet been scheduled, refer to the Post  Discharge Referrals section of the After Visit Summary.    Discharging 94 Contact Information:  [facility] operator at [phone].

## 2014-06-03 ENCOUNTER — Emergency Department
Admission: EM | Admit: 2014-06-03 | Discharge: 2014-06-03 | Disposition: A | Discharge status: Home / Self Care | Payer: Medicaid Other | Attending: Emergency Medicine | Admitting: Emergency Medicine

## 2014-06-03 DIAGNOSIS — Z8719 Personal history of other diseases of the digestive system: Secondary | ICD-10-CM

## 2014-06-03 DIAGNOSIS — K859 Acute pancreatitis, unspecified: Secondary | ICD-10-CM | POA: Insufficient documentation

## 2014-06-03 DIAGNOSIS — I10 Essential (primary) hypertension: Secondary | ICD-10-CM | POA: Insufficient documentation

## 2014-06-03 DIAGNOSIS — R1013 Epigastric pain: Secondary | ICD-10-CM | POA: Insufficient documentation

## 2014-06-03 DIAGNOSIS — Z87891 Personal history of nicotine dependence: Secondary | ICD-10-CM | POA: Insufficient documentation

## 2014-06-03 LAB — CBC WITH DIFF, BLOOD
ANC-Automated: 4.5 10*3/uL (ref 1.6–7.0)
Abs Eosinophils: 0.2 10*3/uL (ref 0.1–0.7)
Abs Lymphs: 2.8 10*3/uL (ref 0.8–3.1)
Abs Monos: 0.6 10*3/uL (ref 0.2–0.8)
Basophils: 1 % (ref 0–2)
Eosinophils: 3 % (ref 1–4)
Hct: 36.5 % (ref 34.0–45.0)
Hgb: 12.6 gm/dL (ref 11.2–15.7)
Imm Gran %: 1 % (ref <1)
Lymphocytes: 34 % (ref 19–53)
MCH: 30.3 pg (ref 26.0–32.0)
MCHC: 34.5 % (ref 32.0–36.0)
MCV: 87.7 um3 (ref 79.0–95.0)
MPV: 9.1 fL — ABNORMAL LOW (ref 9.4–12.4)
Monocytes: 8 % (ref 5–12)
Plt Count: 399 10*3/uL — ABNORMAL HIGH (ref 140–370)
RBC: 4.16 10*6/uL (ref 3.90–5.20)
RDW: 12.6 % (ref 12.0–14.0)
Segs: 55 % (ref 34–71)
WBC: 8.3 10*3/uL (ref 4.0–10.0)

## 2014-06-03 LAB — URINALYSIS WITH CULTURE REFLEX, WHEN INDICATED
Bilirubin: NEGATIVE
Glucose: NEGATIVE
Ketones: NEGATIVE
Leuk Esterase: NEGATIVE
Nitrite: NEGATIVE
Protein: NEGATIVE
Specific Gravity: 1.016 (ref 1.002–1.030)
pH: 7 (ref 5.0–8.0)

## 2014-06-03 LAB — COMPREHENSIVE METABOLIC PANEL, BLOOD
ALT (SGPT): 21 U/L (ref 0–33)
AST (SGOT): 13 U/L (ref 0–32)
Albumin: 4.1 g/dL (ref 3.5–5.2)
Alkaline Phos: 74 U/L (ref 35–140)
Anion Gap: 15 mmol/L (ref 7–15)
BUN: 9 mg/dL (ref 6–20)
Bicarbonate: 26 mmol/L (ref 22–29)
Bilirubin, Tot: 0.17 mg/dL (ref <1.20)
Calcium: 9.5 mg/dL (ref 8.5–10.6)
Chloride: 102 mmol/L (ref 98–107)
Creatinine: 0.63 mg/dL (ref 0.51–0.95)
GFR: >60 mL/min
Glucose: 99 mg/dL (ref 70–115)
Potassium: 3.5 mmol/L (ref 3.5–5.1)
Sodium: 143 mmol/L (ref 136–145)
Total Protein: 7.4 g/dL (ref 6.0–8.0)

## 2014-06-03 LAB — LIPASE, BLOOD: Lipase: 31 U/L (ref 13–60)

## 2014-06-03 MED ORDER — LIDOCAINE VISCOUS 2 % MT SOLN
10.0000 mL | Freq: Once | OROMUCOSAL | Status: AC
Start: 2014-06-03 — End: 2014-06-03
  Administered 2014-06-03: 10 mL via ORAL
  Filled 2014-06-03: qty 15

## 2014-06-03 MED ORDER — ONDANSETRON HCL 4 MG/2ML IV SOLN
4.0000 mg | Freq: Once | INTRAMUSCULAR | Status: DC
Start: 2014-06-03 — End: 2014-06-03
  Filled 2014-06-03: qty 2

## 2014-06-03 MED ORDER — SODIUM CHLORIDE 0.9 % IV BOLUS
1000.0000 mL | INJECTION | Freq: Once | INTRAVENOUS | Status: DC
Start: 2014-06-03 — End: 2014-06-03

## 2014-06-03 MED ORDER — ONDANSETRON 4 MG OR TBDP
4.0000 mg | ORAL_TABLET | Freq: Three times a day (TID) | ORAL | Status: DC | PRN
Start: 2014-06-03 — End: 2015-02-27

## 2014-06-03 MED ORDER — OMEPRAZOLE 40 MG OR CPDR
40.0000 mg | DELAYED_RELEASE_CAPSULE | Freq: Every day | ORAL | Status: DC
Start: 2014-06-03 — End: 2015-02-27

## 2014-06-03 MED ORDER — FAMOTIDINE 20 MG OR TABS
20.0000 mg | ORAL_TABLET | Freq: Two times a day (BID) | ORAL | Status: DC
Start: 2014-06-03 — End: 2015-03-21

## 2014-06-03 MED ORDER — PB-HYOSCY-ATROPINE-SCOPOLAMINE 16.2 MG/5ML PO ELIX
10.0000 mL | ORAL_SOLUTION | Freq: Once | ORAL | Status: AC
Start: 2014-06-03 — End: 2014-06-03
  Administered 2014-06-03: 10 mL via ORAL
  Filled 2014-06-03: qty 10

## 2014-06-03 MED ORDER — FAMOTIDINE IN NACL 20 MG/50ML IV SOLN
20.0000 mg | Freq: Once | INTRAVENOUS | Status: DC
Start: 2014-06-03 — End: 2014-06-03
  Filled 2014-06-03: qty 50

## 2014-06-03 MED ORDER — ONDANSETRON 4 MG OR TBDP
4.0000 mg | ORAL_TABLET | Freq: Once | ORAL | Status: DC
Start: 2014-06-03 — End: 2014-06-03

## 2014-06-03 MED ORDER — ONDANSETRON HCL 4 MG/2ML IV SOLN
4.0000 mg | Freq: Once | INTRAMUSCULAR | Status: AC
Start: 2014-06-03 — End: 2014-06-03
  Administered 2014-06-03: 4 mg via INTRAVENOUS

## 2014-06-03 MED ORDER — ALUM & MAG HYDROXIDE-SIMETH 200-200-20 MG/5ML OR SUSP
30.0000 mL | Freq: Once | ORAL | Status: AC
Start: 2014-06-03 — End: 2014-06-03
  Administered 2014-06-03: 30 mL via ORAL
  Filled 2014-06-03: qty 30

## 2014-06-03 NOTE — ED Notes (Signed)
ECG completed and handed to MD Post for review

## 2014-06-03 NOTE — ED Notes (Signed)
Minimal contact c pt. Pt here for int nausea/pain. Hx of pancreatitis from gallstones. PIV removed, catheter tip intact. VSS. Pt endorses pain as tolerable. Declines further pain interventions. NAD. A&ox4. Pt verbalized understanding of follow-up care, reasons to seek medical help, and medications. Prescription & d/c paperwork given to pt. Pt denied further questions/needs/concerns. Ambulated with steady gait. Husband driving pt home.

## 2014-06-03 NOTE — ED Notes (Signed)
Went to update pt v/s, MD at bedside for pt evaluation. Will return to update pt v/s.

## 2014-06-03 NOTE — ED Notes (Signed)
Went in to update pt v/s, RN Lauren at bedside with pt. RN Leotis Shames states she will update pt v/s when pt returns from restroom.

## 2014-06-03 NOTE — Discharge Instructions (Signed)
You are being discharged from the [facility] after being seen for the condition detailed later in these instructions. Please refer to the instructions for your specific condition and note the symptoms you should watch out for and when to return to the Emergency Dept. You should return as needed if your symptoms worsen or persist, you are unable to eat, drink, or tolerate your medications, you develop chest pain, shortness of breath, or any other health concerns. Please follow up with your primary care doctor or other specialist as discussed today in your follow up plan within 1-2 days if possible for continuation of care.      Epigastric Abdominal Pain (Cause Unspecified)    You were treated for epigastric abdominal (belly) pain. We don t know the cause of the pain yet.    Epigastric pain is located in the center of your upper belly right under your ribs.     There are several common causes of epigastric abdominal pain. These may include:     Gastroesophageal reflux disease (GERD) or heartburn: This is the most common cause. It usually happens right after eating.      Gastritis: This is inflammation of your stomach.     Lactose Intolerance: This means the body can t digest lactose. This is found in milk and other dairy products.     Pancreatitis: This is when your pancreas gets inflamed. Often the pain can be felt in the back.     Ulcers in your stomach or intestine.    Your doctor diagnosed your condition by your physical exam and your history. You may have also had imaging or blood work done to make sure you dont have any serious problems.     Treatment of epigastric abdominal pain focuses on making symptoms better. Fluids and electrolytes (sodium and potassium) may be given through an IV. Pain medications may be given.     Your doctor may give you something to lower acid production or coat the stomach lining.    Acid Reducing Medications - These medications lower the amount of acid made  in your stomach. This helps the inflammation in the stomach lining heal. Scheduling and dosing can vary, so follow the instructions carefully. Two of the common types of antacid medications are H2 blockers and proton pump inhibitors (PPIs). They include:    - H2 famotidine (Pepcid)  - H2 ranitidine (Zantac)  - PPI omeprazole (Prilosec)  - PPI lansoprazole (Prevacid)  - PPI esomeprazole (Nexium)  - PPI pantoprazole (Protonix)    Acid protective medications - These stick to damaged ulcer tissue and protect against acid and enzymes. This way, the ulcer can heal.     - Sucralafate (Carafate).    You have been evaluated, treated, and observed by your doctor.Your doctor feels thatyour condition has stabilized and it is safe for you to go home.    Though we dont believe your condition is dangerous right now, it is important to be careful. Sometimes a problem that seems mild can become serious later. This is why it is very important that you return here or go to the nearest Emergency Department if you are not improving or your symptoms are getting worse.    Your doctor may have you follow up with your primary care doctor as an outpatient to check your condition. Follow up as directed.    Your doctor may prescribe you pain medications to treat yourpain. You can use over-the-counter medicines like acetaminophen (Tylenol). It is important  to follow the directions for taking these medications.    Stool softeners - These medications help with the constipation caused by narcotic medications. You can use over-the-counter laxatives like milk of magnesia or magnesium citrate.    YOU SHOULD SEEK MEDICAL ATTENTION IMMEDIATELY, EITHER HERE OR AT THE NEAREST EMERGENCY DEPARTMENT, IF ANY OF THE FOLLOWING OCCUR:   You have sudden severe pain in your belly or chest.   Your pain gets worse or does not go away.   You throw up blood or see blood in your stool. Blood may be bright red or dark black and tarry.   Your  skin or eyes look yellow or your urine (pee) looks dark brown.   You get a fever (temperature higher than 100.1F or 38C) or shaking chills.     If you can t follow up with your doctor, or if at any time you feel you need to be rechecked or seen again, come back here or go to the nearest emergency department.            Gastritis    You have been diagnosed with gastritis.    Gastritis is an irritation of the stomach lining. It has a number of causes including the use of aspirin and other anti-inflammatory medicines like ibuprofen (Advil or Motrin), naproxen (Naprosyn or Aleve), etc. Other causes are infections and too much stomach acid. Drinking alcohol can also cause gastritis.    You may be prescribed medicines to lower the amount of acid in the stomach or otherwise protect the stomach lining.    Use an antacid (Maalox and Mylanta) as directed on the bottle (1-2 tablespoons or 5-10 ml four times a day).    Avoid caffeine and alcohol. Avoid aspirin and other anti-inflammatory medicines like ibuprofen (Advil or Motrin), naproxen (Naprosyn or Aleve), etc. Acetaminophen (Tylenol) is safe for your stomach.    Spicy foods and acidic foods may increase pain. However, they do not damage the stomach lining. Avoid these foods if they cause pain.    You may be referred to a stomach specialist Psychologist, sport and exercise) for further evaluation.    YOU SHOULD SEEK MEDICAL ATTENTION IMMEDIATELY, EITHER HERE OR AT THE NEAREST EMERGENCY DEPARTMENT, IF ANY OF THE FOLLOWING OCCURS:   Your pain suddenly gets worse.   You vomit (throw up) repeatedly or vomit blood or material that looks like "coffee grounds."   Any blood is in your stool or your stool gets very dark or looks like tar.   You develop a fever (temperature higher than 100.1F / 38C) or shaking chills.

## 2014-06-03 NOTE — ED EKG Interpretation (Signed)
ED EKG Interpretation    NSR, normal axis, normal intervals, no ectopy, nonspec TW changes, no STEMI

## 2014-06-03 NOTE — ED Notes (Signed)
Urine sample obtained and sent to the lab.

## 2014-06-03 NOTE — ED Notes (Signed)
Labs obtained from triage to facilitate care. Yellow, green, lavender and blue tops sent.

## 2014-06-04 LAB — ECG 12-LEAD
ATRIAL RATE: 70 {beats}/min
ECG INTERPRETATION: NORMAL
P AXIS: 42 degrees
PR INTERVAL: 152 ms
QRS INTERVAL/DURATION: 72 ms
QT: 414 ms
QTC INTERVAL: 447 ms
R AXIS: 17 degrees
T AXIS: 29 degrees
VENTRICULAR RATE: 70 {beats}/min

## 2014-06-04 NOTE — ED Provider Notes (Signed)
Emergency Department Provider Note  The Date of Service for the Emergency Room encounter is 06/03/2014  7:49pm  Patient: Teresa [name], MRN [id number], DOB 07/30/1965    Chief Complaint   Patient presents with    Abdominal Pain Re-evaluation     3/12 was admitted for pancreatitis. reports she is now having similar pain again. 7/10 epigastric pain, relieved w sitting position. +n/v (1 episode of vomiting yesterday normal stomach content)        HPI: Teresa [name] is a 49 y/o  Female with history of HTN, recent admission for acute pancreatitis (3/11 - 3/13) who presents to ED with new intermittent burning epigastric pain for 9 days, which she states is non-radiating and w/o obvious alleviating factors.  +Exacerbated with eating foods.  Pt has not tried any medication for analgesia / alleviation of her current pain (did not need any pain medications on discharge).    Approximately 2 days after discharge, patient began to note burning, nonradiating epigastric pain that was worse with food.  The patient believes character of pain is different from pancreatitis, which was more severe and stabbing in character. She had nausea earlier today but has not vomited. She was concerned that she was developing pacnreatitis once more, so she came to ED. No fevers or chills. No constipation/diarrhea. No melena or BRBPR. Denies CP or SOB.    Review of Systems  Constitutional: negative for: malaise, fever.  CV: negative for:  palpitations, chest pain.  Resp: negative for:  cough, shortness of breath.  GI: negative for: vomiting, nausea, abdominal pain, melena, hematochezia, constipation, diarrhea.  GU: negative for: dysuria, frequency, hematuria, retention, incontinence, urgency.  Musculoskeletal: negative for: joint swelling, muscle weakness.  Integumentary: negative for: rash, lumps or bumps.  Neuro: negative for: headaches, numbness or tingling.  Psych: negative for: depressed mood and anxiety.  Heme/Lymphatic: negative for: abnormal  bleeding, abnormal bruising.    PMHx: HTN; Acute pancreatitis (3/11-3/13); No h/o DM, CAD  PSHx: Shoulder surgery; BTL  SHx: +former cigarette smoking; Denies EtOH abuse or illicit drug use  FAMHx: +diabetes, HTN  MEDS:  Denies taking daily medications  ALL: PCN  Primary MD: Melany Guernsey  Patient's medical history has been reviewed today as available in EPIC chart.     Physical Exam  Initial Triage Vitals:       BP: 152/86   Pulse: 76   Temp: 99.9 F (37.7 C)   Resp: 20   Height:  (1.676 m)   Weight: 103.874 kg (229 lb)   SpO2: 99% (normal)   Vital signs reviewed and noted.  Gen: Patient is in NAD, WDWN, non-toxic appearing  HEENT: No e/o head/face trauma.  MMM  Lungs: Normal breath sounds. No wheeze/rales/rhonchi   Chest: RRR. No murmurs appreciated  Abd: Normal BS. Soft. Very mild tenderness in epigastric region with deep palpation. ND. No r/g. No masses.  Extr/MSK: Well perfused, distal pulses intact. No edema. No tenderness.   Skin: Warm, dry. No rashes or lesions.  Neuro: Awake, alert, and oriented.  Speech normal.  Strength, sensation, coordination grossly intact.  Psych: Appropriate. Normal mentation.      Assessment, Medical Decision Making:  Overall this is a 49 y/o Female with history as above who presents with burning epigastric pain worse with eating for 9 days, accompanied by slight nausea.  Pt denies urinary symptoms, vomiting, diarrhea, or bloody stools.  She is well-appearing with no emergent vital sign abnormalities.  Exam is notable for very mild tenderness to  deep epigastric palpation. Normal exam otherwise.      Suspect GERD vs. PUD vs. Gastritis.  Also considered ddx including recurrent pancreatitis, cardiac ischemia, SBO.  Low suspicion for pancreatitis as this pain is milder and different from recent episode, cardiac ischemia given normal EKG, SBO given normal bowel movements and tolerating PO intake.    Plan: screening labs, lipase, EKG.      Results  - CBC: no major acute  abnormalities  - BMP: no major acute abnormalities  - Lipase 31 normal  - UA: +1blood; neg protein / glucose / ketones / bili / nitrite / LE  - EKG: agree with EKG interpretation note (written by resident)      ED Course  - Labs notable for normal lipase, stable hgb, normal wbc.   - patient given GI cocktail, zofran. Patient without abdominal discomfort, n/v.  - plan for discharge from ED with PO Famotidine for temporary relief of GERD and PO Ondansetron for temporary relief prn nausea.  Also will start course of PPI and have pt followup with primary doctor.   Follow up with surgery for consideration of cholecystectomy in future.  - Discussed results, return precautions, and follow up plan with patient who expressed understanding.    Administered:   Medications   famotidine (PEPCID) IVPB 20 mg (not administered)   sodium chloride 0.9 % bolus 1,000 mL (not administered)   ondansetron (ZOFRAN) injection 4 mg (not administered)       Impression: Suspected GERD    Dispo:  Discharged from ED with steady gait, normal vitals, and improved symptoms    Myrtice Lauth, MD  06/04/14 1221    Myrtice Lauth, MD  06/04/14 [phone]

## 2014-06-08 ENCOUNTER — Telehealth (HOSPITAL_BASED_OUTPATIENT_CLINIC_OR_DEPARTMENT_OTHER): Payer: Self-pay

## 2014-06-08 NOTE — Telephone Encounter (Signed)
Called and left a message for patient to contact our office to schedule an appointment per Epic referral. Please review and schedule accordingly.

## 2014-06-10 NOTE — ED Follow-up Note (Signed)
Follow-up type: Callback       Routine ED Patient Call Back    Patient contacted by telephone:  found no issues; patient doing fine.

## 2014-07-08 ENCOUNTER — Ambulatory Visit: Payer: Medicaid Other | Attending: Surgery | Admitting: Surgery

## 2014-07-08 VITALS — BP 128/76 | HR 83 | Temp 98.5°F | Resp 16 | Ht 65.0 in | Wt 233.3 lb

## 2014-07-08 DIAGNOSIS — K8020 Calculus of gallbladder without cholecystitis without obstruction: Principal | ICD-10-CM | POA: Insufficient documentation

## 2014-07-08 MED ORDER — AMLODIPINE 10 MG OR TABS: 10.00 mg | ORAL_TABLET | Freq: Every day | ORAL | Status: AC

## 2014-07-08 NOTE — Progress Notes (Signed)
General Surgery Consult    Teresa [name] is a 49 year old  who presents with history of Pancreatitis (hospitalized 3/11-3/12). Etiology was unclear thought to be gallstones vs hctz. Ultrasound during her admission for pancreatitis revealed cholelithiasis with no e/o cholecystitis. Found to have elevated liver enzymes during admission for pancreatitis (ast 85, alt 289).  Also recently seen in ED for epigastric pain. Given pepcid and prilosec with complete resolution of pain.  No fever, chills.  Denies alcohol abuse.       Past Medical History  Patient Active Problem List   Diagnosis    Pancreatitis [facility])    Essential hypertension       Past Surgical History  Rotator cuff surgery    Social History   Reviewed. Works at [employer]    Review of Symptoms  GEN- No fever, chill  HEENT- No scleral icterus  CV- No chest pain  Pulm- No shortness of breath, cough  GI- No abdominal pain.  No weight loss  GU- no dysuria  Ext- No edema    Medications    Current outpatient prescriptions:     amLODIPINE (NORVASC) 10 MG tablet, Take 10 mg by mouth daily., Disp: , Rfl:     diphenhydrAMINE (BENADRYL) 25 MG tablet, Take 1 tablet (25 mg) by mouth every 6 hours as needed for Itching., Disp: 30 tablet, Rfl: 0    famotidine (PEPCID) 20 MG tablet, Take 1 tablet (20 mg) by mouth 2 times daily., Disp: 15 tablet, Rfl: 0    hydrocortisone 0.5 % cream, Apply 1 Application topically 2 times daily. Use a small amount as directed, Disp: 1 Tube, Rfl: 0    omeprazole (PRILOSEC) 40 MG capsule, Take 1 capsule (40 mg) by mouth daily., Disp: 30 capsule, Rfl: 0    ondansetron (ZOFRAN ODT) 4 MG disintegrating tablet, Take 1 tablet (4 mg) by mouth every 8 hours as needed for Nausea/Vomiting., Disp: 15 tablet, Rfl: 0      Allergies  Pcn      Exam:   07/08/14  0855   BP: 128/76   Pulse: 83   Temp: 98.5 F (36.9 C)   Resp: 16       A&O NAD  No sceral icterus  Regular pulse  Unlabored breathing  Abd soft, ntnd  No lower ext edema      A/P:  49 year old   presents with prior admission for pancreatitis with elevated LFTs.  No history of alcohol abuse to suggest alcoholic pancreatitis. Patient has been symptom free since starting antacids.  We discussed the recommendation for Cholecystectomy based on her previous pancreatitis which was likely related to gallstone.  I discussed the risks, benefits, alternatives of the procedure with the patient.  We discussed risk including bleeding, infection, biloma, bile duct injury, bowel injury, hernia, and risks related to anesthesia including MI and death.  Patient understands these risks and agrees to proceed. Informed consent was signed in clinic    Henriette Combs, MD    07/08/2014  7:53 PM

## 2014-07-08 NOTE — Progress Notes (Signed)
ID: Teresa [name] is a 49 year old female with history of HTN, pancreatitis (recent admission [facility] 3/11 -3/12) presenting as a referral from ED to evaluate abdominal pain.    SUBJECTIVE:     # Epigastric Pain: Recently seen in ED (3/24); work-up in ED revealed normal lipase, Hg, WBC. He was D/C with a PPI, famotidine and ondansetron.    Since her recent discharge for pancreatitis, the patient experienced burning epigastric without radiation; denies RUQ pain. The pain is worse with lying down and immediately after eating; it is better with sitting up.  She also reports good relief of the pain with pepcid and prilosec given after d/c from ED.    Denies EtOH use, eating greasy/fried/fatty food (only x2/mo). Denies chest pain, shortness of breath. Denies fever/chills.    # Pancreatitis (hospitalized 3/11-3/12): etiology was undiscovered, thought to be gallstones vs hctz. Ultrasound during her admission for pancreatitis revealed cholelithiasis with no e/o cholecystitis. Found to have elevated liver enzymes during admission for pancreatitis (ast 85, alt 289).     SHx:  Works at [employer]; just took 4 months off of work. Plans to move to [place] later this year.    PSH:  - Rotator Cuff repair @ [facility] 6 months ago  - Umbilical hernia repair as child    Meds:   Current Outpatient Prescriptions   Medication Sig    diphenhydrAMINE (BENADRYL) 25 MG tablet Take 1 tablet (25 mg) by mouth every 6 hours as needed for Itching.    famotidine (PEPCID) 20 MG tablet Take 1 tablet (20 mg) by mouth 2 times daily.    hydrocortisone 0.5 % cream Apply 1 Application topically 2 times daily. Use a small amount as directed    omeprazole (PRILOSEC) 40 MG capsule Take 1 capsule (40 mg) by mouth daily.    ondansetron (ZOFRAN ODT) 4 MG disintegrating tablet Take 1 tablet (4 mg) by mouth every 8 hours as needed for Nausea/Vomiting.     No current facility-administered medications for this visit.       Allergies:  Allergies   Allergen  Reactions    Pcn [Penicillins] Unspecified       OBJECTIVE:  BP 128/76 mmHg   Pulse 83   Temp(Src) 98.5 F (36.9 C) (Oral)   Resp 16   Ht $R'5\' 5"'zS$  (1.651 m)   Wt 105.802 kg (233 lb 4 oz)   BMI 38.82 kg/m2    Physical Exam:  Gen: well-appearing, pleasant  Abd: soft, mild-distention. Mild TTP in the epigastric region and RUQ.    Labs:   From ED 3/24  - Cbc: wbc 8.3, hg 12.6, plt 399  - Lipase: 31  - Liver: ast 13, alt 21, alk phos 74, tbili 0.17  - Bmp: na 143, k 3.5, cr 0.63, Hoople 9.5    Imaging/Procedures:  U/S Abdomen (312/16)  Cholelithiasis with no evidence of acute cholecystitis.  Fatty infiltration of the liver.    ASSESSMENT/PLAN:  [name] is a 50 year old female with history of HTN, pancreatitis (recent admission [facility] 3/11 -3/12) presenting as a referral from ED to evaluate abdominal pain.    # Gastro-esophageal reflux: The epigastric pain and complete response to PPI/H2 blocker is classic for GER. Recommend continued medical management. Additionally, discussed with the patient that she pursue non-pharmacologic therapy, including diet modification and weight loss. PCP is following this problem  - Diet modification/weight loss  - Continue pepcid and prilosec  - Return to clinic for this  problem prn    # Pancreatitis of unknown etiology, possibly stone from gallbladder obstructing pancreatic duct: Work-up from her hospitalization for pancreatitis (3/11-12) did not reveal an etiology. However, it is not unreasonable to believe that cholelithiasis may have precipitated her pancreatitis, supported by transaminitis during admission and stones visualized by U/S, making cholecystectomy reasonable. Risks and benefits for cholecystectomy were discussed. She demonstrated good understanding and signed a consent to laparoscopic cholecystectomy.  - schedule with OR for lap chole    Teresa [name], MSIII  [phone]

## 2014-07-14 ENCOUNTER — Ambulatory Visit: Payer: Medicaid Other | Attending: Cardiovascular Disease | Admitting: Internal Medicine

## 2014-07-14 VITALS — BP 147/85 | HR 83 | Temp 97.9°F | Resp 16 | Ht 65.0 in | Wt 233.0 lb

## 2014-07-14 DIAGNOSIS — I10 Essential (primary) hypertension: Secondary | ICD-10-CM | POA: Insufficient documentation

## 2014-07-14 DIAGNOSIS — R9431 Abnormal electrocardiogram [ECG] [EKG]: Principal | ICD-10-CM | POA: Insufficient documentation

## 2014-07-14 DIAGNOSIS — Z136 Encounter for screening for cardiovascular disorders: Secondary | ICD-10-CM | POA: Insufficient documentation

## 2014-07-14 DIAGNOSIS — Z01818 Encounter for other preprocedural examination: Secondary | ICD-10-CM | POA: Insufficient documentation

## 2014-07-14 NOTE — Progress Notes (Signed)
Cardiology Clinic Note    CC:   Chief Complaint   Patient presents with   • New Patient       HPI: Teresa [name] is a 49 year old female with a history of HTN, cholelithiasis and recent diagnosis of pancreatitis here to establish care after "abnormal EKG" found in the ED when she was hospitalized for pancreatitis.     Patient denies any chest pain/discomfort, shortness of breath, jaw or neck pains. No lightheadedness, dizziness, palpitations. No nausea/vomiting since the hospitalization. No abdominal pain. No functional limitations to her activity level and is able to bike around, walk up flights of stairs without any chest pain or discomfort.     ROS:   General: No fatigue, no body weight changes. No fevers or chills.   Neuro: No headache, no numbness or tingling.   HEENT: No eye, ear, throat or neck lesions or masses  CV: see HPI   Lungs: No cough, no wheezing   GI: No abdominal pain, no nausea, no diarrhea.   GU: No dysuria, no hematuria   Ortho: No joint pain   Hem: No claudication sx   Skin: No rashes. No Pruritis.    PMH:   - HTN  - Cholelithiasis  - Pancreatitis h/o    Family History:  Cardiovascular disease: None  DM: Mother     Social History:  Tobacco: Prior smoker, quit 10 years ago. 0.5ppd for 15 years   EtOH: 1 glass of wine twice a month   Drugs: Denies any illicits or IVDU    Current Outpatient Prescriptions   Medication Sig   • amLODIPINE (NORVASC) 10 MG tablet Take 10 mg by mouth daily.   • diphenhydrAMINE (BENADRYL) 25 MG tablet Take 1 tablet (25 mg) by mouth every 6 hours as needed for Itching.   • famotidine (PEPCID) 20 MG tablet Take 1 tablet (20 mg) by mouth 2 times daily.   • hydrocortisone 0.5 % cream Apply 1 Application topically 2 times daily. Use a small amount as directed   • omeprazole (PRILOSEC) 40 MG capsule Take 1 capsule (40 mg) by mouth daily.   • ondansetron (ZOFRAN ODT) 4 MG disintegrating tablet Take 1 tablet (4 mg) by mouth every 8 hours as needed for Nausea/Vomiting.     No  current facility-administered medications for this visit.     Allergies   Allergen Reactions   • Pcn [Penicillins] Unspecified       Physical Exam:   BP 147/85 mmHg   Pulse 83   Temp(Src) 97.9 °F (36.6 °C) (Oral)   Resp 16   Ht 5' 5" (1.651 m)   Wt 105.688 kg (233 lb)   BMI 38.77 kg/m2   SpO2 97%    General Appearance: No acute distress, alert and oriented x3, pleasant affect, cooperative.  HEENT: PERRL, EOMI. Moist mucous membranes.  Neck: Neck supple. JVP 7cm   Heart: Regular rate, S1 and S2 heard with no murmurs, clicks, or gallops.   Lungs: Clear to auscultation bilaterally, no accessory muscle usage.  Abdomen: BS normal. Abdomen soft, non-tender and non-distended. No masses or organomegaly.  Extremities: No clubbing, cyanosis, or edema.  Skin: No rashes or irregular pigmented lesions.  Neuro: CN II-XII intact. Nonfocal     Lab Results   Component Value Date    BUN 9 06/03/2014    CREAT 0.63 06/03/2014    CL 102 06/03/2014    NA 143 06/03/2014    K 3.5 06/03/2014      CA 9.5 06/03/2014    TBILI 0.17 06/03/2014    ALB 4.1 06/03/2014    TP 7.4 06/03/2014    AST 13 06/03/2014    ALK 74 06/03/2014    BICARB 26 06/03/2014    ALT 21 06/03/2014    GLU 99 06/03/2014      Lab Results   Component Value Date    WBC 8.3 06/03/2014    HGB 12.6 06/03/2014    HCT 36.5 06/03/2014    PLT 399 06/03/2014     Lab Results   Component Value Date    TRIG 105 05/22/2014      Cardiac Studies:   ECG: NSR, TWI in lead III, and slight STD in lead II    Assessment and Plan:    Teresa [name] is a 49 year old female with history of HTN, cholelithiasis and recent diagnosis of pancreatitis here to establish care after "abnormal EKG" found in the ED when she was hospitalized for pancreatitis.     # EKG changes: Nonspecific changes present on EKG, though patient without any symptoms and is functionally active without limitations. Performs at least 4 METS as able to bike around, walk up flights of stairs   - Given plan for cholecystectomy next  month and these EKG changes, obtain echocardiogram for pre-op   - Patient able to perform at least 4 METS, so no need for further stress testing at this time prior to her surgery next month assuming her echocardiogram is normal       Discussed with Dr. Castellanos  Return to clinic as needed. Will follow up via phone with patient results of echocardiogram       Felice Lin, MD  General Cardiology Fellow

## 2014-07-14 NOTE — Progress Notes (Signed)
Cardiology Attending Note    CC: Abnormal EKG    Subjective:  I reviewed the history with Dr Juel Burrow   Patient interviewed and examined.    History of present illness (HPI):    Today I had the pleasure to evaluate Teresa [name] who as you are aware, is a 49 year old patient who presents to the [facility] for the evaluation of abnormal EKG. Please refer to the cardiology fellow's note for further details.    Medications:  As documented in the medical chart.    Past Medical, Family, Social History:  As per the medical chart and/or cardiology fellow's note.    Review of Systems:  Constitutional: Negative. Eyes, Ears, Nose, Mouth, Throat: negative. CV: per HPI. Resp: negative. GI: negative. GU: negative. Musculoskeletal: Stiffness. Integumentary: negative. Neuro: Negative. Psych: Negative. Endo: negative. Heme/Lymphatic: negative.    Objective:   BP 147/85 mmHg   Pulse 83   Temp(Src) 97.9 F (36.6 C) (Oral)   Resp 16   Ht 5\' 5"  (1.651 m)   Wt 105.688 kg (233 lb)   BMI 38.77 kg/m2   SpO2 97%  I have examined the patient and I concur with the cardiology fellow's exam.    Assessment and Plan:   The assessment and plan has been discussed and reviewed with the cardiology fellow.  Briefly, the patient was seen in the cardiology clinic for evaluation of cardiac diseases including abnormal EKG and pre-op evaluation for cholecystectomy.  - Obtain echocardiogram to evaluate abnormal EKG    The assessment and plan has been discussed with the patient.  I agree with the cardiology fellow's plan as documented.    Please, see the cardiology fellow's note for further details.    Lannah Koike R. Acquanetta Belling, MD, MPH  Associate Clinical Professor of Medicine  [facility] Division of Cardiology

## 2014-07-14 NOTE — Patient Instructions (Signed)
Echocardiogram

## 2014-07-15 LAB — ECG 12-LEAD
ATRIAL RATE: 72 {beats}/min
ECG INTERPRETATION: NORMAL
P AXIS: 66 degrees
PR INTERVAL: 158 ms
QRS INTERVAL/DURATION: 78 ms
QT: 408 ms
QTC INTERVAL: 446 ms
R AXIS: 58 degrees
T AXIS: 15 degrees
VENTRICULAR RATE: 72 {beats}/min

## 2014-08-24 ENCOUNTER — Ambulatory Visit
Admission: RE | Admit: 2014-08-24 | Discharge: 2014-08-24 | Disposition: A | Discharge status: Home / Self Care | Payer: Medicaid Other | Source: Ambulatory Visit | Attending: Cardiovascular Disease | Admitting: Cardiovascular Disease

## 2014-08-24 DIAGNOSIS — I081 Rheumatic disorders of both mitral and tricuspid valves: Secondary | ICD-10-CM

## 2014-08-24 DIAGNOSIS — R9431 Abnormal electrocardiogram [ECG] [EKG]: Principal | ICD-10-CM | POA: Insufficient documentation

## 2014-08-24 DIAGNOSIS — I501 Left ventricular failure: Secondary | ICD-10-CM

## 2014-08-24 DIAGNOSIS — Z01818 Encounter for other preprocedural examination: Secondary | ICD-10-CM | POA: Insufficient documentation

## 2014-08-24 DIAGNOSIS — I517 Cardiomegaly: Secondary | ICD-10-CM

## 2014-08-25 LAB — 2D ECHO WITH IMAGE ENHANCEMENT AGENT IF NECESSARY
IVC Diameter: 2 cm
LA Volume Index: 33.1 ml/m²
LV Ejection Fraction: 66 %
PA Pressure: 32.2 mmHg

## 2014-08-30 ENCOUNTER — Ambulatory Visit: Payer: Medicaid Other | Attending: General Surgery | Admitting: General Surgery

## 2014-08-30 ENCOUNTER — Encounter (HOSPITAL_BASED_OUTPATIENT_CLINIC_OR_DEPARTMENT_OTHER): Payer: Self-pay | Admitting: General Surgery

## 2014-08-30 VITALS — BP 145/92 | HR 73 | Temp 98.4°F | Resp 20 | Ht 65.0 in | Wt 233.9 lb

## 2014-08-30 DIAGNOSIS — Z01818 Encounter for other preprocedural examination: Principal | ICD-10-CM

## 2014-08-30 NOTE — H&P (Signed)
Preoperative Anesthesia Risk Assessment    Surgeon: Dr. Costantini    C/C: Gallstones    HPI: This is a 49 year old female with prior admission for pancreatitiswho presents to the [facility] for preoperative anesthesia evaluation prior to elective cholecystectomy planned for 09/06/14.    SURGERY LOCATION: [facility]    Teresa [name] does not get chest pain or breathlessness when walking.    No past medical history on file.    No past surgical history on file.    Allergies   Allergen Reactions   • Pcn [Penicillins] Unspecified       Current Outpatient Prescriptions   Medication Sig Dispense Refill   • amLODIPINE (NORVASC) 10 MG tablet Take 10 mg by mouth daily.     • diphenhydrAMINE (BENADRYL) 25 MG tablet Take 1 tablet (25 mg) by mouth every 6 hours as needed for Itching. 30 tablet 0   • famotidine (PEPCID) 20 MG tablet Take 1 tablet (20 mg) by mouth 2 times daily. 15 tablet 0   • hydrocortisone 0.5 % cream Apply 1 Application topically 2 times daily. Use a small amount as directed 1 Tube 0   • omeprazole (PRILOSEC) 40 MG capsule Take 1 capsule (40 mg) by mouth daily. 30 capsule 0   • ondansetron (ZOFRAN ODT) 4 MG disintegrating tablet Take 1 tablet (4 mg) by mouth every 8 hours as needed for Nausea/Vomiting. 15 tablet 0     No current facility-administered medications for this visit.        Social History     Social History   • Marital status: Married     Spouse name: N/A   • Number of children: N/A   • Years of education: N/A     Occupational History   • Not on file.     Social History Main Topics   • Smoking status: Former Smoker   • Smokeless tobacco: Not on file   • Alcohol use Not on file   • Drug use: Not on file   • Sexual activity: Not on file     Other Topics Concern   • Not on file     Social History Narrative   • No narrative on file     REVIEW OF SYSTEMS:   General:  Negative for recent illness or hospitalizations, fevers, chills, night sweats.   Respiratory: Negative for shortness of  breath, asthma, COPD, emphysema, OSA, CPAP, oxygen use.   Cardiovascular: Negative for chest pain, palpitations, CHF, syncope, murmurs, CAD, heart stents, arrythmias, pacemaker, ICD. Hx HTN  Neurologic: Negative for seizures, strokes.  Psychiatric: Negative for bipolar DO, PTSD, ADHD, schizophrenia.  Endocrine: Negative for diabetes.     Physical Exam:   Vitals: There were no vitals taken for this visit.  General: NAD, alert and oriented to TPP and situation, speech clear.   HEENT: Mallampati class 2 airway. Opens mouth fully. + prognath. Uvula midline. Throat clear.  Cardiovascular: S1, S2. No murmurs.  Pulmonary: No respiratory distress or labored breathing; BS's: clear bilaterally.  Neuro: Follows complex commands. No obvious deficits.     Labs:  Lab Results   Component Value Date    NA 143 06/03/2014    K 3.5 06/03/2014    CL 102 06/03/2014    BICARB 26 06/03/2014    BUN 9 06/03/2014    CREAT 0.63 06/03/2014    GLU 99 06/03/2014    CA 9.5 06/03/2014    AST 13 06/03/2014      ALT 21 06/03/2014    ALK 74 06/03/2014    TBILI 0.17 06/03/2014    ALB 4.1 06/03/2014    TP 7.4 06/03/2014     Lab Results   Component Value Date    WBC 8.3 06/03/2014    RBC 4.16 06/03/2014    HGB 12.6 06/03/2014    HCT 36.5 06/03/2014    MCV 87.7 06/03/2014    MCHC 34.5 06/03/2014    RDW 12.6 06/03/2014    PLT 399 06/03/2014    MPV 9.1 06/03/2014    LYMPHS 34 06/03/2014    MONOS 8 06/03/2014    EOS 3 06/03/2014    BASOS 1 06/03/2014     No results found for: PT, INR, PTT  No components found for: TYPE,  SCREEN,  ABO  No results found for: CRP  No components found for: LIVER    EKG: 07/25/14 Normal sinus rhythm @ 72  Nonspecific ST and T wave abnormality    ECHO Summary:   1. The left ventricular size is normal and systolic function is normal.   2. The right ventricular size is normal and systolic function is normal.   3. Mild or grade I (impaired relaxation pattern) LV diastolic filling.   4. Mild left ventricular hypertrophy.   5. Mild MV  regurgitation.   6. Mild tricuspid regurgitation.   7. No previous study for comparison.    Assessment:  -49 year old female     Functional Capacity: >4METS    Teresa [name] can proceed with elective surgery as planned.     Preoperative history and physical completed today.     ASA 2    Plan/Recommendations:  PROCEED TO O R     SPECIAL MEDICATION INSTRUCTIONS:   Regular medications should be taken after midnight on the day of surgery with a sip of water unless you were instructed below not to take them.  •  Please hold all Nsaids (Motrin, Aleve, Ibuprofen, Mobic, Celebrex ), vitamins, supplements, herbs, and fish oil, for 7 days prior to surgery.   •  Please hold aspirin for 7 days before surgery.   • You do not have to stop taking acetaminophen (Tylenol), tramadol (Ultram), hydrocodone (Vicodin, Percocet, Norco, and oxycodone) and other pain pills not containing aspirin type ingredients prior to surgery.  Code Status: Full Code/Full Care

## 2014-08-30 NOTE — Anesthesia Preprocedure Evaluation (Addendum)
ANESTHESIA PRE-OPERATIVE EVALUATION    Patient Information    Name: Teresa [name]    MRN: [id number]    DOB: 13-Jul-1965    Age: 49 year old    Sex: female  Procedure(s):  CHOLECYSTECTOMY LAPAROSCOPIC VS OPEN      BP (!) 145/92  Pulse 73  Temp 98.4 F (36.9 C) (Oral)  Resp 20  Ht $R'5\' 5"'Kx$  (1.651 m)  Wt 106.1 kg (233 lb 14.5 oz)  LMP 08/06/2014  SpO2 100%  BMI 38.92 kg/m2        Primary language spoken:  English    ROS/Medical History:  General Review & History of Anesthetic Complications:    No history of anesthetic complications, No PONV, No history of difficult intubation and no family history of anesthetic complications     49 year old presents with prior admission for pancreatitis Cardiovascular:    Exercise tolerance: >4 METS (Walks 4 blocks)  (+)    hypertension well controlled        (-) valvular problems/murmurs, past MI, dysrhythmias, angina, CHF   Pulmonary:     (+) shortness of breath, , , , ,     ROS comment: Allergies; tea tree oil causes swelling, itching  Hematology/Oncology:     (-)  chemotherapy and radiation treatment    ROS Comments Cervical scraping   Neuro/Psych:           Infectious Disease:   Negative ROS         Endo/Other:   - negative ROS  GI/Hepatic:     (+) GERD well controlled,   (-) hepatitis, liver disease   Renal:    - Negative ROS        Pregnancy History:             [facility]) Notes:  no beta blocker therapy  [facility] Test & records reviewed by [facility] provider  [facility] Echo/ECG/Angio test available.  [facility] labs available.    Pre op instructions reviewed:  :      Pre op instructions reviewed:          Physical Exam    Airway:  Inter-inciser distance 3-4 cm    Mallampati: II  Neck ROM: full  TM distance: 4-5 cm  Short thick neck: Yes    Cardiovascular:    Rhythm: regular   Rate: normal         Pulmonary:  - pulmonary exam normal           Neuro/Neck/Skeletal/Skin:  - [place] ANE PHYS EXAM NEGATIVE ROS SKIN SKELETAL NEURO NECK          Dental:  - normal exam      Abdominal:      General:  obesity     Additional Clinical Notes:               Last  OSA (STOP BANG) Score:  No Data Recorded    Last OSA Score for   Has a physician diagnosed you with sleep agnea?: No  Do you use a CPAP at home?: No  Do you snore loudly (loud enough to be heard through a closed door)?: 0  Do you often feel tired, fatigued or sleepy during the day?: 0  Has anyone observed that you stop breathing while you are sleeping?: 0  Have you ever been treated for high blood pressure?: 1  OSA total score (A score of 2 or more is high risk. Offer patient sleep study.): 1  Has a physician diagnosed you with sleep agnea?: No  Do you use a CPAP at home?: No  OSA total score (A score of 2 or more is high risk. Offer patient sleep study.): 1    Past Medical History   Diagnosis Date    GERD (gastroesophageal reflux disease)     HTN (hypertension)      Past Surgical History   Procedure Laterality Date    Shoulder arthroscopy w/ rotator cuff repair      Nose surgery      Tubal ligation       Social History   Substance Use Topics    Smoking status: Former Smoker     Quit date: 08/29/2001    Smokeless tobacco: Not on file    Alcohol use No       Current Outpatient Prescriptions   Medication Sig Dispense Refill    amLODIPINE (NORVASC) 10 MG tablet Take 10 mg by mouth daily.      diphenhydrAMINE (BENADRYL) 25 MG tablet Take 1 tablet (25 mg) by mouth every 6 hours as needed for Itching. 30 tablet 0    famotidine (PEPCID) 20 MG tablet Take 1 tablet (20 mg) by mouth 2 times daily. 15 tablet 0    hydrocortisone 0.5 % cream Apply 1 Application topically 2 times daily. Use a small amount as directed 1 Tube 0    omeprazole (PRILOSEC) 40 MG capsule Take 1 capsule (40 mg) by mouth daily. 30 capsule 0    ondansetron (ZOFRAN ODT) 4 MG disintegrating tablet Take 1 tablet (4 mg) by mouth every 8 hours as needed for Nausea/Vomiting. 15 tablet 0     No current facility-administered medications for this visit.      Allergies   Allergen Reactions       Pcn [Penicillins] Unspecified       Labs and Other Data  Lab Results   Component Value Date    NA 143 06/03/2014    K 3.5 06/03/2014    CL 102 06/03/2014    BICARB 26 06/03/2014    BUN 9 06/03/2014    CREAT 0.63 06/03/2014    GLU 99 06/03/2014    [place] 9.5 06/03/2014     Lab Results   Component Value Date    AST 13 06/03/2014    ALT 21 06/03/2014    ALK 74 06/03/2014    TP 7.4 06/03/2014    ALB 4.1 06/03/2014    TBILI 0.17 06/03/2014    DBILI <0.2 05/22/2014     Lab Results   Component Value Date    WBC 8.3 06/03/2014    RBC 4.16 06/03/2014    HGB 12.6 06/03/2014    HCT 36.5 06/03/2014    MCV 87.7 06/03/2014    MCHC 34.5 06/03/2014    RDW 12.6 06/03/2014    PLT 399 06/03/2014    MPV 9.1 06/03/2014    SEG 55 06/03/2014    LYMPHS 34 06/03/2014    MONOS 8 06/03/2014    EOS 3 06/03/2014    BASOS 1 06/03/2014     No results found for: INR, PTT  No results found for: ARTPH, ARTPO2, ARTPCO2    Anesthesia Plan:  ASA 2 (Mild systemic disease)     Planned anesthesia method: general   Planned monitoring method: Routine monitoring      Comments:    Dental risks explained & discussed  Anesthetic plan and risks discussed with Patient.  Plan discussed with Attending.

## 2014-08-30 NOTE — Patient Instructions (Addendum)
PREOPERATIVE SURGICAL INFORMATION    Your surgery is currently scheduled at [facility] hospital/facility/department on 09/06/14  With a planned report time of 0530am    [facility], [place], [address] [place], [place] [address]  Please check in at [facility] [employer], [place], 1st floor.    These instructions have been created specifically for you after a very thorough evaluation of your health.   If you have received instructions from your surgeons office which contradict these instructions please contact us as soon as possible so that we can help resolve any confusion.   [facility]: [phone]     On the day of your Surgery or Procedure, please arrive at the time and location provided by your Surgeon.  The time that you have been provided with is an estimate of when you will need to check in for surgery.  If you have any questions regarding your arrival time, please call:   Preoperative Surgical Admissions at [facility]: [phone]      SPECIAL MEDICATION INSTRUCTIONS:   Regular medications should be taken after midnight on the day of surgery with a sip of water unless you were instructed below not to take them.    Please hold all Nsaids (Motrin, Aleve, Ibuprofen, Mobic, Celebrex ), vitamins, supplements, herbs, and fish oil, for 7 days prior to surgery.     Please hold aspirin for 7 days before surgery.    You do not have to stop taking acetaminophen (Tylenol), tramadol (Ultram), hydrocodone (Vicodin, Percocet, Norco, and oxycodone) and other pain pills not containing aspirin type ingredients prior to surgery.       Preparing for your Surgery   [facility] is a non-smoking facility   Contact lenses, all jewelry, piercings, and false teeth must be removed before surgery.    It is best not to bring valuables with you unless you can leave them in the care of a friend or family member while you are in the operating room.    Please  wear clean loose-fitting clothes.   Shower before surgery and/or use the special soap according to the special instructions provided.   Bring a picture ID and your insurance card, and be prepared to pay your deductible or co-insurance by cash, check, or credit card when you arrive.   If you will be admitted as an in-patient, [facility] has open visiting hours around the clock, but it is up to the discretion of your nurse to allow friends/family to enter or stay overnight.    Please be sure to arrange for an adult to drive you home after your surgery.  Your surgery will be canceled if you have not arranged this!  You may take a taxi or bus home only if a responsible adult will be able to accompany you home in the taxi or the bus.    Absolutely No Solid Food Is Allowed After Midnight The Night Before Surgery (Regardless Of The Time Your Surgery Will Start).    You May Have Clear Liquids Until 4 Hours Before Surgery Is Scheduled To Start. (Clear Liquids Include Clear Broth, Black Coffee Or Tea Without Cream, Apple Juice, Jell-O, And Other Liquids That You Can See Through In The Sunlight. Milk And Pulpy Juices Are Not Considered Clear Liquids. Sugar, Artificial Sweeteners, And Honey Are Okay To Add To Other Clear Liquids).    On The Day of Your Surgery:    Check in at the location where  your surgery or procedure will be performed.    You will be able to talk with your anesthesiologist and surgeon before surgery and all of your questions will be answered   You will wake up in the recovery area after your operation is over and be able to see your friends/family once you are awake.    You will receive pain control by means of oral medication, IV medication, and/or nerve blocks/epidural.    You will be admitted to the hospital or discharged home after you have recovered from anesthesia based on your postoperative medical condition.   You will receive additional discharge instructions and information  before you are discharged from the recovery room.   You will need to someone to stay at home with you for the first 24 hours after surgery.        Additional detailed information about what to expect before and after surgery is available to you online at:  [email or web address]    Or by searching You-tube for [place] before surgery and [place] after surgery    You medical records are available to you at [email or web address]  Select create account.

## 2014-09-06 ENCOUNTER — Ambulatory Visit
Admission: RE | Admit: 2014-09-06 | Discharge: 2014-09-06 | Disposition: A | Discharge status: Home / Self Care | Payer: Medicaid Other | Attending: Surgery | Admitting: Surgery

## 2014-09-06 ENCOUNTER — Ambulatory Visit (HOSPITAL_COMMUNITY): Payer: Medicaid Other | Admitting: General Surgery

## 2014-09-06 ENCOUNTER — Ambulatory Visit (HOSPITAL_COMMUNITY): Payer: Medicaid Other | Admitting: Pain Medicine

## 2014-09-06 ENCOUNTER — Encounter (HOSPITAL_COMMUNITY): Admission: RE | Disposition: A | Discharge status: Home Health | Payer: Self-pay | Attending: Surgery

## 2014-09-06 DIAGNOSIS — K8020 Calculus of gallbladder without cholecystitis without obstruction: Secondary | ICD-10-CM

## 2014-09-06 DIAGNOSIS — K8010 Calculus of gallbladder with chronic cholecystitis without obstruction: Secondary | ICD-10-CM

## 2014-09-06 DIAGNOSIS — E669 Obesity, unspecified: Secondary | ICD-10-CM | POA: Insufficient documentation

## 2014-09-06 DIAGNOSIS — K219 Gastro-esophageal reflux disease without esophagitis: Secondary | ICD-10-CM | POA: Insufficient documentation

## 2014-09-06 DIAGNOSIS — I10 Essential (primary) hypertension: Secondary | ICD-10-CM | POA: Insufficient documentation

## 2014-09-06 DIAGNOSIS — G8918 Other acute postprocedural pain: Secondary | ICD-10-CM

## 2014-09-06 DIAGNOSIS — K824 Cholesterolosis of gallbladder: Secondary | ICD-10-CM

## 2014-09-06 DIAGNOSIS — Z6838 Body mass index (BMI) 38.0-38.9, adult: Secondary | ICD-10-CM | POA: Insufficient documentation

## 2014-09-06 DIAGNOSIS — K8011 Calculus of gallbladder with chronic cholecystitis with obstruction: Secondary | ICD-10-CM

## 2014-09-06 SURGERY — CHOLECYSTECTOMY, LAPAROSCOPIC
Anesthesia: General | Site: Abdomen

## 2014-09-06 MED ORDER — NEOSTIGMINE METHYLSULFATE 10 MG/10ML IV SOLN
INTRAVENOUS | Status: DC | PRN
Start: 2014-09-06 — End: 2014-09-06
  Administered 2014-09-06: 5 mg via INTRAVENOUS

## 2014-09-06 MED ORDER — LACTATED RINGERS IV SOLN
INTRAVENOUS | Status: DC
Start: 2014-09-06 — End: 2014-09-06

## 2014-09-06 MED ORDER — HYDROMORPHONE HCL 1 MG/ML IJ SOLN
0.5000 mg | INTRAMUSCULAR | Status: DC | PRN
Start: 2014-09-06 — End: 2014-09-06

## 2014-09-06 MED ORDER — HYDROCODONE-ACETAMINOPHEN 5-325 MG OR TABS
1.0000 | ORAL_TABLET | Freq: Four times a day (QID) | ORAL | 0 refills | Status: DC | PRN
Start: 2014-09-06 — End: 2015-02-27

## 2014-09-06 MED ORDER — ONDANSETRON HCL 4 MG/2ML IV SOLN
4.0000 mg | Freq: Once | INTRAMUSCULAR | Status: DC | PRN
Start: 2014-09-06 — End: 2014-09-06

## 2014-09-06 MED ORDER — ACETAMINOPHEN 10 MG/ML IV SOLN
INTRAVENOUS | Status: DC | PRN
Start: 2014-09-06 — End: 2014-09-06
  Administered 2014-09-06: 1000 mg via INTRAVENOUS

## 2014-09-06 MED ORDER — FAMOTIDINE 20 MG/2ML IV SOLN
INTRAVENOUS | Status: DC | PRN
Start: 2014-09-06 — End: 2014-09-06
  Administered 2014-09-06: 20 mg via INTRAVENOUS

## 2014-09-06 MED ORDER — DOCUSATE SODIUM 250 MG OR CAPS
250.0000 mg | ORAL_CAPSULE | Freq: Every day | ORAL | 0 refills | Status: DC
Start: 2014-09-06 — End: 2015-02-27

## 2014-09-06 MED ORDER — SUCCINYLCHOLINE CHLORIDE 20 MG/ML IJ SOLN
INTRAMUSCULAR | Status: DC | PRN
Start: 2014-09-06 — End: 2014-09-06
  Administered 2014-09-06: 100 mg via INTRAVENOUS

## 2014-09-06 MED ORDER — LIDOCAINE HCL 1 % IJ SOLN
0.1000 mL | INTRAMUSCULAR | Status: DC | PRN
Start: 2014-09-06 — End: 2014-09-06

## 2014-09-06 MED ORDER — ONDANSETRON HCL 4 MG/2ML IV SOLN
INTRAMUSCULAR | Status: DC | PRN
Start: 2014-09-06 — End: 2014-09-06
  Administered 2014-09-06: 4 mg via INTRAVENOUS

## 2014-09-06 MED ORDER — FENTANYL CITRATE (PF) 100 MCG/2ML IJ SOLN
25.0000 ug | INTRAMUSCULAR | Status: DC | PRN
Start: 2014-09-06 — End: 2014-09-06

## 2014-09-06 MED ORDER — FENTANYL CITRATE (PF) 100 MCG/2ML IJ SOLN
50.0000 ug | INTRAMUSCULAR | Status: DC | PRN
Start: 2014-09-06 — End: 2014-09-06
  Administered 2014-09-06 (×2): 50 ug via INTRAVENOUS
  Filled 2014-09-06: qty 2

## 2014-09-06 MED ORDER — NALOXONE HCL 0.4 MG/ML IJ SOLN
0.1000 mg | INTRAMUSCULAR | Status: DC | PRN
Start: 2014-09-06 — End: 2014-09-06

## 2014-09-06 MED ORDER — PROPOFOL IV BOLUS 10 MG/ML
INTRAVENOUS | Status: DC | PRN
Start: 2014-09-06 — End: 2014-09-06
  Administered 2014-09-06: 180 mg via INTRAVENOUS

## 2014-09-06 MED ORDER — FENTANYL CITRATE (PF) 250 MCG/5ML IJ SOLN
INTRAMUSCULAR | Status: DC | PRN
Start: 2014-09-06 — End: 2014-09-06
  Administered 2014-09-06 (×2): 50 ug via INTRAVENOUS
  Administered 2014-09-06: 100 ug via INTRAVENOUS
  Administered 2014-09-06 (×2): 50 ug via INTRAVENOUS

## 2014-09-06 MED ORDER — ROCURONIUM BROMIDE 100 MG/10ML IV SOLN
INTRAVENOUS | Status: DC | PRN
Start: 2014-09-06 — End: 2014-09-06
  Administered 2014-09-06: 20 mg via INTRAVENOUS
  Administered 2014-09-06: 10 mg via INTRAVENOUS
  Administered 2014-09-06: 30 mg via INTRAVENOUS

## 2014-09-06 MED ORDER — DEXAMETHASONE SODIUM PHOSPHATE 4 MG/ML IJ SOLN (CUSTOM)
INTRAMUSCULAR | Status: DC | PRN
Start: 2014-09-06 — End: 2014-09-06
  Administered 2014-09-06: 8 mg via INTRAVENOUS

## 2014-09-06 MED ORDER — MEPERIDINE HCL 25 MG/ML IJ SOLN
12.5000 mg | INTRAMUSCULAR | Status: DC | PRN
Start: 2014-09-06 — End: 2014-09-06

## 2014-09-06 MED ORDER — CLINDAMYCIN PHOSPHATE IN D5W 600 MG/50ML IV SOLN
INTRAVENOUS | Status: DC | PRN
Start: 2014-09-06 — End: 2014-09-06
  Administered 2014-09-06: 600 mg via INTRAVENOUS

## 2014-09-06 MED ORDER — BUPIVACAINE HCL (PF) 0.25 % IJ SOLN
INTRAMUSCULAR | Status: DC | PRN
Start: 2014-09-06 — End: 2014-09-06
  Administered 2014-09-06: 24 mL

## 2014-09-06 MED ORDER — SODIUM CHLORIDE 0.9 % IV SOLN
12.5000 mg | Freq: Once | INTRAVENOUS | Status: DC | PRN
Start: 2014-09-06 — End: 2014-09-06

## 2014-09-06 MED ORDER — LACTATED RINGERS IV SOLN
INTRAVENOUS | Status: DC | PRN
Start: 2014-09-06 — End: 2014-09-06
  Administered 2014-09-06: 07:00:00 via INTRAVENOUS

## 2014-09-06 MED ORDER — GLYCOPYRROLATE 1 MG/5ML IJ SOLN
INTRAMUSCULAR | Status: DC | PRN
Start: 2014-09-06 — End: 2014-09-06
  Administered 2014-09-06: 1 mg via INTRAVENOUS

## 2014-09-06 MED FILL — HYDROCODONE/APAP TAB 5-325 MG: MG | 7 days supply | Qty: 40 | Fill #0

## 2014-09-06 MED FILL — DOCUSATE SODIUM CAP 250 MG (SOFTGEL): MG | 30 days supply | Qty: 30 | Fill #0

## 2014-09-06 SURGICAL SUPPLY — 63 items
APPLICATOR CHLORAPREP 26ML, ~~LOC~~ (Misc Medical Supply) ×2 IMPLANT
APPLIER ENDOSCOPIC LIGACLIP 12-L 12MM W/20 TI LG CLIPS ER420 (Suture) IMPLANT
CHLORAPREP WITH TINT ×2
CLEARIFY SYSTEM VISUALIZATION SCOPE WARMER D-HELP (Lap/Endo/Arthroscopy) ×2 IMPLANT
CLIP APPLIER LIGAMAX 5MM W/ 15 TI CLIPS (Suture) ×2 IMPLANT
COVER LIGHT HANDLE RIGID (2 PACK) (Misc Medical Supply) ×2
DERMABOND ADVANCE 0.7ML (Suture) IMPLANT
DEVICE CLOSURE ENDO CLOSE AUTO SUTURE TROCAR (Lap/Endo/Arthroscopy) IMPLANT
DISSECTOR 3 ENDOPATH BLUNT TIP 5MM X 40.5CM (Lap/Endo/Arthroscopy)
DRAIN RELIAVAC FLAT 10MM X 20CM 0070440 (Drains/Catheter/Tubes/Reservoir) IMPLANT
DRAPE HALF SHEET MEDIUM (Drape/Gowns/Gloves/Pack)
DRESSING TEGADERM 6" X 8" (Dressings/packing)
DRESSING TEGADERM HP 2.375" X 2.375" (Dressings/packing) ×8 IMPLANT
ELECTRODE FLAT L-HOOK LAPAROSCOPIC 36CM (Lap/Endo/Arthroscopy) ×2
ENDO MULTI CLIP APPLIER ROTATING 10MM ER320 (Suture) IMPLANT
GAUZE, SPONGE CURITY, 8 PLY, STERILE, 2"X2",2'S (Dressings/packing) ×8
GLOVE BIOGEL INDICATOR UNDERGLOVE SIZE 6.5 (Drape/Gowns/Gloves/Pack) ×2 IMPLANT
GLOVE BIOGEL INDICATOR UNDERGLOVE SIZE 7 (Drape/Gowns/Gloves/Pack) ×4 IMPLANT
GLOVE BIOGEL INDICATOR UNDERGLOVE SIZE 7.5 (Drape/Gowns/Gloves/Pack) ×2 IMPLANT
GLOVE BIOGEL PI INDICATOR SIZE 7.5 (Drape/Gowns/Gloves/Pack) ×2 IMPLANT
GLOVE BIOGEL PI ULTRATOUCH SIZE 7.5 (Drape/Gowns/Gloves/Pack) ×2
GLOVE SURGEON BIOGEL SIZE 7.5 (Drape/Gowns/Gloves/Pack) ×2 IMPLANT
GLOVE SURGICAL BIOGEL SIZE 6.5 (Drape/Gowns/Gloves/Pack) ×4 IMPLANT
GOWN MICRO COOL LG BLUE, AAMI LVL 4 (Drape/Gowns/Gloves/Pack) ×2
GOWN SIRUS XLG BLUE, AAMI LVL 4 (Drape/Gowns/Gloves/Pack)
GOWN SURGICAL ULTRA XL BLUE, AAMI LVL 3 (Drape/Gowns/Gloves/Pack) ×2 IMPLANT
HEMOSTAT SURGICEL FIBRILLAR 4" X 4" (Non-Pharmacy Meds/Solutions)
HEMOSTAT SURGICEL ORIGINAL 4" X 8" (Non-Pharmacy Meds/Solutions) IMPLANT
IRRIGATOR ENDO-FLO (Lap/Endo/Arthroscopy)
KENDALL SCD EXPRESS SLEEVES ×2 IMPLANT
LAPAROSCOPIC PACK-LF ×2 IMPLANT
LIGATURE ENDOLOOP VICRYL 0 18" (Misc Medical Supply)
NEEDLE SPINAL 20G X 3-1/2" (Needles/punch/cannula/biopsy) IMPLANT
PAD GROUND VALLEYLAB REM ADULT E7507 (Misc Medical Supply) ×2
POUCH ENDOCATCH SPECIMEN RETRIEVAL BAG 10 MM X 26CM (Lap/Endo/Arthroscopy) ×2
PROTECTOR ULNAR NERVE PAD, YELLOW (Misc Medical Supply) ×4 IMPLANT
SET MIXTER ENDOSCOPIC CHOLANGIOGRAPHY CATHETER 5FR X 50CM (Procedural wires/sheaths/catheters/balloons/dilators)
SHEAR HARMONIC ACE+ CURVED L36 CM OD5 MM TAPER TIP- SINGLE USE (Lap/Endo/Arthroscopy)
SLEEVE SCD KNEE MEDIUM (Misc Medical Supply) ×2
SOLUTION IRR POUR BTL 0.9% NS 1000ML (Non-Pharmacy Meds/Solutions) ×2
SOLUTION IRR POUR BTL H20 1000ML (Non-Pharmacy Meds/Solutions)
SOLUTION IV 0.9% NS 1000ML (Non-Pharmacy Meds/Solutions) ×2
SPONGE LAP RF DETECT 18" X 18" XRAY STERILE (Drape/Gowns/Gloves/Pack) IMPLANT
STOPCOCK 3-WAY W/SWIVEL MALE LUER LOCK (Misc Medical Supply)
STRIP MEDI-STRIP SKIN CLOSURE 1/2 X 4" (Dressings/packing) ×1 IMPLANT
STRIP SKIN CLOSURE 1/2 X 4 (Dressings/packing) ×1
SUCTION IRRIGATOR STRYKEFLOW2- SINGLE USE (Lap/Endo/Arthroscopy) ×2
SURGICAL PACK LAPAROSCOPIC (Procedure Packs/kits) ×2 IMPLANT
SUTURE ETHILON 2-0 18" FS (Suture) IMPLANT
SUTURE MONOCRYL PLUS 4-0 27" PS-2 MCP426 (Suture) ×4 IMPLANT
SUTURE VICRYL PLUS 0 27" UR6 (VCP603) (Suture) ×3
SUTURE VICRYL PLUS 0 27" UR6 VCP603 (Suture) ×3 IMPLANT
SUTURE VICRYL PLUS 2-0 27" UR-6 VCP602 (Suture) IMPLANT
SUTURE VICRYL PLUS 3-0 27" SH VCP416 (Suture) ×2 IMPLANT
SYRINGE HYPO LL 20CC (Needles/punch/cannula/biopsy) IMPLANT
SYRINGE HYPO LL 30CC (Needles/punch/cannula/biopsy) IMPLANT
TOWELS OR BLUE 4-PACK STERILE, DISPOSABLE (Drapes/towels) ×2
TRAY FOLEY 16FR ANTIMICROBIAL W/STATLOCK (Kits/Sets/Trays) IMPLANT
TROCAR ENDOPATH XCEL BLADELESS OPTIVIEW STABILITY SLEEVE 12MM X L100 MM (Lap/Endo/Arthroscopy) IMPLANT
TROCAR ENDOPATH XCEL BLADELESS OPTIVIEW STABILITY SLEEVE 5MM X L100 MM (Lap/Endo/Arthroscopy) ×2
TROCAR ENDOPATH XCEL OPTIVIEW 12 X 100MM, BLNT TP H12LP (Lap/Endo/Arthroscopy) ×2
TROCAR ENDOPATH XCEL STABILITY SLEEVE 12MM X L100 MM UNIVERSAL (Lap/Endo/Arthroscopy) IMPLANT
TROCAR ENDOPATH XCEL STABILITY SLEEVE 5MM X L100 MM, UNIVERSAL (Lap/Endo/Arthroscopy) ×4

## 2014-09-06 NOTE — Anesthesia Postprocedure Evaluation (Signed)
Anesthesia Transfer of Care Note    Patient: Teresa [name]    Procedures performed: Procedure(s):  CHOLECYSTECTOMY LAPAROSCOPIC     Vital signs: stable           Anesthesia Post Note    Patient: Teresa [name]    Procedure(s) Performed: Procedure(s):  CHOLECYSTECTOMY LAPAROSCOPIC       Final anesthesia type: general    Patient location: PACU    Post anesthesia pain: adequate analgesia    Post assessment: no apparent anesthetic complications, tolerated procedure well and no evidence of recall    Mental status: awake, alert  and oriented    Airway Patent: Yes    Last Vitals:   Vitals:    09/06/14 0930   BP: 142/90   Pulse: 87   Resp: 17   Temp:    SpO2: 100%       Post vital signs: stable    Hydration: adequate    N/V:no    Anesthetic complications: no        Plan of care per primary team.

## 2014-09-06 NOTE — H&P (Signed)
HISTORY & PHYSICAL - INTERVAL ASSESSMENT    **ONLY TO BE USED IN ADDITION TO A HISTORY & PHYSICALAdali [name]  [phone]      This interval assessment is required for History & Physical completed less than 30 days prior to the admission or surgery. A History & Physical completed more than 30 days prior to the admission or surgery must be repeated.    Current Medical Status:  Unchanged    Medications / Allergies:  Unchanged    Review of Systems:  Unchanged    Physical Examination:  I have examined the patient today.  Unchanged    Laboratory or Clinical Data:  Unchanged    Modifications of Initial Care Plan:  Unchanged        Teresa [name] Teresa [name]     09/06/14     6:55 AM

## 2014-09-06 NOTE — Addendum Note (Signed)
Addendum  created 09/06/14 0951 by Levin Erp, MD    Anesthesia Event deleted, Anesthesia Event edited, Anesthesia Intra Devices edited, Anesthesia Intra Flowsheets edited, Anesthesia Intra LDAs edited, Anesthesia Intra Meds edited, Anesthesia Staff edited, LDA properties accepted, Patient device added, Patient device removed, Procedure Event Log accessed

## 2014-09-06 NOTE — RN OR/Procedure Note (Signed)
Humidity checked 0705 57.0%

## 2014-09-06 NOTE — Op Note (Signed)
OPERATIVE NOTE:    PREOPERATIVE DIAGNOSIS: Gallstones    POSTOPERATIVE DIAGNOSIS: Gallstones    PROCEDURE: Procedure(s) (LRB):  CHOLECYSTECTOMY LAPAROSCOPIC (N/A)     ATTENDING SURGEON: Surgeon(s) and Role:  * Rande Roylance, Eusebio Friendly, MD - Primary    ASSISTANTS(s):   * Cantrell, Genelle Gather, MD - Assistant Surgeon  * Gilman Buttner, Lodema Pilot, MD - Resident - Assisting    ANESTHESIA: General, local    FINDINGS: cholelithiasis, specimen with single lumen duct    WOUND CLASSIFICATION: Class I (clean)    WOUND CLOSURE STATUS: All layers of surgical incision (deep and superficial) were fully closed.    SPECIMENS:     ID Type Source Tests Collected by Time Destination   A : Gallbladder  Other Gallbladder PATHOLOGY TISSUE Eulis Canner, RN 09/06/2014 4158        Fluids/Blood Products:    IV Fluids: 1250 mL  Blood Products: none  EBL: 2 mL  Urine Output: not measured    COMPLICATIONS: None    DISPOSITION: Stable condition to PACU      INDICATIONS: The patient presented to our clinic with a history of having repeated episodes of right upper quadrant pain. Ultrasound done as an outpatient had revealed gallstones. The patient had the procedure of laparoscopic cholecystectomy explained  including the risks and benefits, including bleeding, infection, bile duct injury, retained stones. The patient indicated understanding of the risks and benefits of the procedure and provided a written consent prior to the operation, after indicating that questions had been answered.     OPERATIVE COURSE: The patient was brought to the operating room and placed in the supine position. After the induction of general anesthesia, she was prepped and draped in the usual sterile fashion. She received a perioperative dose of IV antibiotics. We performed a time-out prior to beginning the procedure. We began by making a 12 mm incision in a vertical fashion just above the umbilicus. We dissected through the soft  tissue with the electrocautery.  The S retractors were used to dissect down to the fascia.  The fascia was grasped with Kocher clamps and elevated anteriorly. An 11 blade was used to incise the fascia and peritoneum and enter the abdominal cavity. This was done under direct visualization.  A 48mm Hassan trochar was inserted after placing an 0 vicryl suture to secure the fascia. Once in the abdominal cavity, we confirmed no evidence of bleeding or visceral injury. At this point, we changed to a 10 mm, 30-degree scope.  A 63mm port was placed under direct visualization just below the xiphoid sternum after injection of local anesthesia.  Finally two 5 mm ports were introduced in the midclavicular and anterior axillary line, each 10 cm below the costal margin, again after local anesthesia through transverse 5 mm incisions.    There were omental adhesions to the anterior abdominal wall just superior to the umbilicus.  These were taken down with a combination of blunt dissection and use of the shears.  This allowed [place] to visualize the gallbladder.  The gallbladder was grasped from the right lateral port and lifted cephalad. The gallbladder was free of most adhesions. Some light peritoneal attachments were taken down bluntly with the use of the [place]. The infundibulum was readily visualized. The peritoneum overlying the infundibulum was taken down carefully with the use of the [place] and judicious use of cautery. This exposed the infundibulum. Careful dissection at its base revealed the cystic duct which was easily encircled. The cystic artery was  similarly found just medial to the duct.  We continued our dissection until a critical view was obtained, identifying the cystic duct and cystic artery entering the gallbladder with the liver clearly seen begind. Both of these were clipped twice on the proximal side, once on the distal side, and divided with the microshears. We then grabbed the infundibulum and retracted it  superior anteriorly, dissected the gallbladder away from the gallbladder fossa with the L-hook cautery dividing the peritoneum and other attachments. Prior to removal of the final attachment near the fundus of the gallbladder, we examined the gallbladder fossa and no evidence of bleeding or bile leak was seen. The final attachments were then removed and the gallbladder placed in a EndoCatch bag introduced by the epigastric port. Due to the presence of stones in the gallbladder, we had to stretch the incision slightly using the Pean clamp which then allowed [place] to remove the gallbladder without difficulty. We re-examined the abdomen again with the endoscope. No issues of bleeding or leak were seen.     Finally, prior to desufflating the abdomen, we removed the 5mm ports under direct visualization. The [facility] trochar was removed and the abdomen was desufflated. The 0 vicryl suture was tied. Local anesthesia was injected at the umbilical incision sites.  We closed the skin using 4-0 Monocryl suture. Dermabond was applied to the incisions. She was extubated and taken to the PACU in stable condition. I was present for the entire operation today.    Dr. Almond Lint, the attending surgeon, was scrubbed and present for the entire procedure.

## 2014-09-06 NOTE — Discharge Instructions (Signed)
[facility] Surgery Discharge Instructions    You must have a responsible adult drive you home or ride with you if you take a taxi. After surgery you may not feel like your normal self immediately. Some people "bounce back" faster than others; some people need more time to feel good. Rest and take things slowly. Do not make important decisions or sign important contracts for at least 24 hours after your surgery. For your safety and well being, you should not drive, drink alcohol, or engage in vigorous activities or exercise for 24 hours following surgery.    You may experience any of the following after an operation:   Soreness from incision.   A sore throat if you were put to sleep and a breathing tube was placed in your throat.   Sleepiness from anesthesia or other drugs given to you prior to or during your surgery.   Fatigue just from having surgery.   Nausea occurs sometimes, but depends largely on your reaction to surgery and drugs.  These discomforts should improve rapidly and are usually gone the day following surgery.    You may speed your own recovery by the following precautions and self care:    1. Dressing and/or incision care:  Remove dressing 48 hours after surgery. You have steristrips in place under dressing, they will fall off in 1-2 weeks. Okay to remove in 2 weeks if they do not fall off on their own.     2. Diet:   Begin with liquids and easily digestible foods., Fluids encouraged. and Progress to your normal diet as tolerated. You may notice bloating or diarrhea when you eat larger meals or meals with more fat content. Bowel movements may be loose for the next couple of weeks.     3. Activity:  No heavy lifting for 4-6 weeks and Avoid driving or operating machinery while you require pain medication    4. Bathing:  You may take a shower tomorrow, keeping incision site clean and dry and No baths, jacuzzi, pools, or ocean swimming for 2 weeks    5. Medications:  You may resume your previous  medication schedule.    6. Return Appointment:  Please contact your doctors office    7. Other Instructions:  Take pain medication and stool softeners as necessary.    Call your doctor or come to the Emergency Department at [facility] if you have:   severe chills or fever   excessive bleeding from the site of your surgery, or if your dressing becomes soaked with blood   excessive pain, swelling, or odor at the site of your surgery   fainting, trouble breathing, or persistent dizziness   unable to urinate      For any concerns, call the [facility] Page Operator at [phone] and ask for the Doctor On-Call for Surgery

## 2014-09-06 NOTE — Brief Op Note (Signed)
BRIEF OPERATIVE NOTE    DATE: 09/06/2014  TIME: 9:19 AM     PREOPERATIVE DIAGNOSIS: Gallstones    POSTOPERATIVE DIAGNOSIS: Gallstones    PROCEDURE: Procedure(s) (LRB):  CHOLECYSTECTOMY LAPAROSCOPIC  (N/A)     ATTENDING SURGEON: Surgeon(s) and Role:     * Costantini, Eusebio Friendly, MD - Primary    ASSISTANTS(s):      * Cantrell, Genelle Gather, MD - Assistant Surgeon     * Gilman Buttner, Lodema Pilot, MD - Resident - Assisting    ANESTHESIA: General, local    FINDINGS: cholelithiasis, specimen with single lumen duct    WOUND CLASSIFICATION:  Class I (clean)    WOUND CLOSURE STATUS:  All layers of surgical incision (deep and superficial) were fully closed.    SPECIMENS:     ID Type Source Tests Collected by Time Destination   A : Gallbladder  Other Gallbladder PATHOLOGY TISSUE Eulis Canner, RN 09/06/2014 2376        Fluids/Blood Products:      IV Fluids: 1250 mL    Blood Products: none    EBL: 2 mL    Urine Output: not measured    COMPLICATIONS: None    DISPOSITION: Stable condition to PACU

## 2014-09-06 NOTE — RN OR/Procedure Note (Signed)
A/O x4 patient verbalized understanding of surgical procedure and stated no metal implants. No family present. Encouraged to void and remove under garments.

## 2014-09-08 NOTE — Procedures (Signed)
FINAL PATHOLOGIC DIAGNOSIS:  A: Gallbladder, cholecystectomy       -Chronic cholecystitis with cholesterolosis and cholelithiasis.    SPECIMEN(S) SUBMITTED:  A: Gallbladder    CLINICAL HISTORY:  Gallstones (K80.20).    GROSS DESCRIPTION:  A: The specimen (received in formalin labeled with the patient's name and  medical record number and "gallbladder") consists of a 9.0 x 3.0 x 1.5 cm  gallbladder.  The serosa is tan-pink, glistening, hyperemic, and cauterized  along the hepatic attachment.  The specimen is opened adjacent to the  cystic duct revealing green-tan, velvety mucosa with diffuse gold  speckling.  The wall thickness is 0.2 cm.  The opened specimen reveals  three tan-yellow crushable varicoid-shaped calculi, all measuring 0.6 x 0.6  x 0.3 cm.  Grossly the stones do not obstruct the cystic duct.  There is  approximately 20.0 ml of viscous green bile.  Representative sections of  the fundus, body, and cystic duct margin are submitted in cassette A1.  ES/dw  CONFIDENTIAL HEALTH INFORMATION: Health Care information is personal and  sensitive information. If it is being faxed to you it is done so under  appropriate authorization from the patient or under circumstances that do  not require patient authorization. You, the recipient, are obligated to  maintain it in a safe, secure and confidential manner. Re-disclosure  without additional patient consent or as permitted by law is prohibited.  Unauthorized re-disclosure or failure to maintain confidentiality could  subject you to penalties described in federal and state law.  If you have  received this report or facsimile in error, please notify the [facility]  [facility]ology Department immediately and destroy the received document(s).    Material reviewed and Interpreted and  Report Electronically Signed by:  Renae Fickle M.D., Ph.D. [phone])  Attending Surgical Pathologist  09/08/14 08:46  Electronic Signature derived from a single  controlled access password

## 2014-09-22 ENCOUNTER — Ambulatory Visit: Payer: Medicaid Other | Attending: Surgery | Admitting: Surgery

## 2014-09-22 ENCOUNTER — Encounter (HOSPITAL_BASED_OUTPATIENT_CLINIC_OR_DEPARTMENT_OTHER): Payer: Self-pay | Admitting: Surgery

## 2014-09-22 VITALS — BP 126/76 | HR 76 | Temp 98.9°F | Resp 17 | Ht 65.0 in | Wt 234.0 lb

## 2014-09-22 DIAGNOSIS — Z9889 Other specified postprocedural states: Secondary | ICD-10-CM

## 2014-09-22 NOTE — Progress Notes (Signed)
49yo female s/p lap chole doing well at home. Denies f/c, tolerating liquids and most foods, however occasionally gets crampy pain and nausea/dry heaves with heavy or fatty foods. Denies jaundice/icterurus/pruritis. Reports normal bowel/bladder function. Wounds healing well denies drainage, redness, or tenderness.    Obese female NAD  Abdomen soft ND, NTTP, lap port sites well healed c/d/i, without erythema, induration, fluctuance or drainage. No palpable port site hernias on exam with valsalva.     Pathology benign consistent with diagnosis.     A/P doing well with the exception of some heavy food intolerance, anticipate a post surgical reaction that will resolve spontaneously, RTC in 6 weeks for symptom check will consider workup at that time.

## 2014-09-22 NOTE — Patient Instructions (Signed)
Follow up in 6 weeks to discuss your diet and appetite    Should you feel well and not feel as if you need to see [place] please call and cancel your appointment    Consider keeping a food diary of which food items cause your symptoms and how long your symptoms last after you eat each problem item

## 2014-10-19 ENCOUNTER — Emergency Department
Admission: EM | Admit: 2014-10-19 | Discharge: 2014-10-19 | Disposition: A | Discharge status: Home / Self Care | Payer: Medicaid Other | Attending: Emergency Medicine | Admitting: Emergency Medicine

## 2014-10-19 ENCOUNTER — Encounter (HOSPITAL_COMMUNITY): Payer: Self-pay | Admitting: Emergency Medicine

## 2014-10-19 DIAGNOSIS — K219 Gastro-esophageal reflux disease without esophagitis: Secondary | ICD-10-CM | POA: Insufficient documentation

## 2014-10-19 DIAGNOSIS — I10 Essential (primary) hypertension: Secondary | ICD-10-CM | POA: Insufficient documentation

## 2014-10-19 DIAGNOSIS — E669 Obesity, unspecified: Secondary | ICD-10-CM | POA: Insufficient documentation

## 2014-10-19 DIAGNOSIS — M19012 Primary osteoarthritis, left shoulder: Secondary | ICD-10-CM

## 2014-10-19 DIAGNOSIS — Y92832 Beach as the place of occurrence of the external cause: Secondary | ICD-10-CM | POA: Insufficient documentation

## 2014-10-19 DIAGNOSIS — W010XXA Fall on same level from slipping, tripping and stumbling without subsequent striking against object, initial encounter: Secondary | ICD-10-CM | POA: Insufficient documentation

## 2014-10-19 DIAGNOSIS — Z87891 Personal history of nicotine dependence: Secondary | ICD-10-CM | POA: Insufficient documentation

## 2014-10-19 DIAGNOSIS — M25512 Pain in left shoulder: Principal | ICD-10-CM | POA: Insufficient documentation

## 2014-10-19 DIAGNOSIS — S46002A Unspecified injury of muscle(s) and tendon(s) of the rotator cuff of left shoulder, initial encounter: Secondary | ICD-10-CM

## 2014-10-19 MED ORDER — NAPROXEN 250 MG OR TABS
250.0000 mg | ORAL_TABLET | Freq: Two times a day (BID) | ORAL | 0 refills | Status: DC
Start: 2014-10-19 — End: 2015-03-21

## 2014-10-19 MED ORDER — NAPROXEN 500 MG OR TABS
250.00 mg | ORAL_TABLET | Freq: Once | ORAL | Status: AC
Start: 2014-10-19 — End: 2014-10-19
  Administered 2014-10-19: 250 mg via ORAL
  Filled 2014-10-19: qty 1

## 2014-10-19 NOTE — ED MD Progress Note (Signed)
Chief Complaint   Patient presents with    Shoulder Pain     Lt shoulder pain s/p mechanical fall 2 days ago. Limited ROM.      "We were somewhere looking at seals."    S: 49 year old female presents with left shoulder pain x2 days. Ms Schorn states that she was climbing up a low wall (approximately 1 foot) when she tripped, landing on left shoulder. She also had some left knee pain, but this has resolved. No head strike, no LOC, no abdominal pain.    Past Medical History   Diagnosis Date    GERD (gastroesophageal reflux disease)     HTN (hypertension)        Past Surgical History   Procedure Laterality Date    Shoulder arthroscopy w/ rotator cuff repair Right     Nose surgery      Tubal ligation      Cholecystectomy         No current facility-administered medications on file prior to encounter.      Current Outpatient Prescriptions on File Prior to Encounter   Medication Sig Dispense Refill    amLODIPINE (NORVASC) 10 MG tablet Take 10 mg by mouth daily.      diphenhydrAMINE (BENADRYL) 25 MG tablet Take 1 tablet (25 mg) by mouth every 6 hours as needed for Itching. 30 tablet 0    docusate sodium (COLACE) 250 MG capsule Take 1 capsule (250 mg) by mouth daily. 30 capsule 0    famotidine (PEPCID) 20 MG tablet Take 1 tablet (20 mg) by mouth 2 times daily. 15 tablet 0    HYDROcodone-acetaminophen (NORCO) 5-325 MG tablet Take 1 tablet by mouth every 6 hours as needed for Moderate Pain (Pain Score 4-6). 40 tablet 0    hydrocortisone 0.5 % cream Apply 1 Application topically 2 times daily. Use a small amount as directed 1 Tube 0    omeprazole (PRILOSEC) 40 MG capsule Take 1 capsule (40 mg) by mouth daily. 30 capsule 0    ondansetron (ZOFRAN ODT) 4 MG disintegrating tablet Take 1 tablet (4 mg) by mouth every 8 hours as needed for Nausea/Vomiting. 15 tablet 0       ALLERGIC TO: Tea tree oil and Pcn [penicillins]    Social History   Substance Use Topics    Smoking status: Former Smoker     Quit date: 08/29/2001      Smokeless tobacco: Never Used    Alcohol use No       Family History   Problem Relation Age of Onset    Heart Attack Neg Hx        Review of systems:  Gen (-) fever  Neuro (-) headache  ENT (-) sore throat  Eyes (-) blurry vision  Resp (-) cough  GI (-) abd pain, (-) vomiting  CV (-) chest pain  GU (-) dysuria  Musculoskeletal (-) neck pain, (-) back pain, (+) shoulder pain  Skin (-) rashes      O:   Vitals:    10/19/14 1342 10/19/14 1600   BP: 156/87 154/86   Pulse: 81 79   Resp: 18 18   Temp: 98.3 F (36.8 C) 98.2 F (36.8 C)   SpO2: 98% 98%   Weight: 89.4 kg (197 lb)    Height: 5\' 5"  (1.651 m)        GEN: alert/oriented x 3, in no acute distress, lying in a hospital gown in bed in HC 17.  SKIN: warm/dry, no rashes or bruising noted over left shoulder.  HEENT: Head: atraumatic. Eyes: sclera anicteric, conjunctiva not injected. No periorbital ecchymoses. Nose: normal in shape & symmetric, no rhinorrhea noted. No blood or CSF. Mouth:  No erythema or exudate noted. Membranes moist, no blood.  NECK: No posterior bony tenderness.  ABD: Flat, nondistended, no rigidity, guarding, or rebound tenderness.  MUSCULOSKELETAL: There is no posterior bony midline tenderness over the cervical, thoracic, lumbar, or sacral spines. There is tenderness over the superior left shoulder without evidence of dislocation. Able to abduct LUE >90 degrees. Empty soda can test positive for pain on left.  NEUROLOGIC: Grip strength 5+ bilateral upper extremities.    Imaging:  Orders Placed This Encounter   Procedures    X-Ray Shoulder Complete Min 2 Views - Left   Per rads, XR left shoulder indicates no fracture.      A: Left shoulder pain, likely strain, after fall. There is low clinical suspicion for occult fracture of the shoulder or spine. Ms Gitlin was given Codman exercises by Dr Lynnea Ferrier and should treat her symptoms conservatively with rest & pain medication.    In assessing this patient, I have reviewed both chart records and lab  findings. Please note that I have discussed management and agree with the plan by the resident Dr. Lynnea Ferrier.      P: /Ms. Golubski should follow up closely with the PMD as specified in the discharge instructions, and should return to the ED at once if worse in any way.

## 2014-10-19 NOTE — ED Notes (Signed)
Assumed care medicated per MD orders for left shoulder pain. resting on gurney with bed low and wheels locked and side rail up x1 and call bell in reach.

## 2014-10-19 NOTE — Discharge Instructions (Signed)
Rotator Cuff Injury     You have been diagnosed with a suspected injury to the rotator cuff. This is in your shoulder.     The rotator cuff is a set of four muscle tendons. They hold the humerus (upper arm) to the torso (chest). Tendons connect muscles to bone. A rotator cuff injury can be sudden. It can also happen from wear and tear on the shoulder from seemingly normal use. It can be very painful. It is often made worse when you try to lift the arm out to the side or reach behind your back.     The doctor suspects an injury like this based on shoulder exam results. An orthopedic doctor or primary care doctor familiar with this type of injury should make the definitive diagnosis. This is typically done based on detailed physical examination and an MRI scan or arthrogram. An arthrogram is an injection of contrast dye into the shoulder followed by x-rays. Your doctor or referral doctor can decide on the best test to use for you.     Treatment for this injury can be as simple as rest and pain control for minor injuries. It may involve surgery to repair a rotator cuff tear.     Emergency treatment for this condition includes:  · Referral to an orthopedic (bone) specialist or your primary care doctor for more care.  · Resting the shoulder and avoiding activity that causes pain.  · Using a sling.  · Pain medicines.     YOU SHOULD SEEK MEDICAL ATTENTION IMMEDIATELY, EITHER HERE OR AT THE NEAREST EMERGENCY DEPARTMENT, IF ANY OF THE FOLLOWING OCCURS:  · Increased or worsening pain.  · Unable to use the affected arm.  · Numbness, tingling or weakness of the affected arm.

## 2014-10-19 NOTE — ED Notes (Signed)
AVS signed and copy provided to patient, ambulatory to exit with steady gait.

## 2014-10-19 NOTE — ED Provider Notes (Signed)
Emergency Department Note  [facility] electronic medical record reviewed for pertinent medical history.     Nursing Triage Note:   Chief Complaint   Patient presents with    Shoulder Pain     Lt shoulder pain s/p mechanical fall 2 days ago. Limited ROM.        HPI:   49 year old right handed female with a PMH significant for HTN, obesity presents with left shoulder pain. Patient [place] beach and landed on her right arm. No headstike, loc. Has been having left shoulder pain with limited rom since. No other pain or injury. Describes pain as an ache and has not taken any analgesia.    HPI    Past Medical History   Diagnosis Date    GERD (gastroesophageal reflux disease)     HTN (hypertension)        Past Surgical History   Procedure Laterality Date    Shoulder arthroscopy w/ rotator cuff repair      Nose surgery      Tubal ligation         Family History:     No family history on file.    Social History:    Social History   Substance Use Topics    Smoking status: Former Smoker     Quit date: 08/29/2001    Smokeless tobacco: Not on file    Alcohol use No       Medications:   Prior to Admission Medications   Prescriptions Last Dose Informant Patient Reported? Taking?   HYDROcodone-acetaminophen (NORCO) 5-325 MG tablet   No No   Sig: Take 1 tablet by mouth every 6 hours as needed for Moderate Pain (Pain Score 4-6).   amLODIPINE (NORVASC) 10 MG tablet   Yes No   Sig: Take 10 mg by mouth daily.   diphenhydrAMINE (BENADRYL) 25 MG tablet   No No   Sig: Take 1 tablet (25 mg) by mouth every 6 hours as needed for Itching.   docusate sodium (COLACE) 250 MG capsule   No No   Sig: Take 1 capsule (250 mg) by mouth daily.   famotidine (PEPCID) 20 MG tablet   No No   Sig: Take 1 tablet (20 mg) by mouth 2 times daily.   hydrocortisone 0.5 % cream   No No   Sig: Apply 1 Application topically 2 times daily. Use a small amount as directed   omeprazole (PRILOSEC) 40 MG capsule   No No   Sig: Take 1 capsule (40 mg) by mouth  daily.   ondansetron (ZOFRAN ODT) 4 MG disintegrating tablet   No No   Sig: Take 1 tablet (4 mg) by mouth every 8 hours as needed for Nausea/Vomiting.      Facility-Administered Medications: None       Allergies: Tea tree oil and Pcn [penicillins]    Review of Systems   ROS:  Gen: -fever, -chills, -fatigue  HENT: -dental pain, -congestion, -sore throat, -trouble speaking/swallowing  Eyes: -eye pain, -eye discharge, -photophobia, -visual disturbance  Resp: -SOB, -wheezing, -cough  CV: -CP, -palp, -leg swelling  GI: -abdominal pain, -distension, -nausea, -vomiting, -diarrhea, -constipation  GU: -dysuria, -hematuria, -genital discharge, -genital bleeding, -genital sore  MSK: +arthralgia, -back pain, -neck pain, -neck stiffness, -myalgia, -joint swelling  Skin: -color change, -wound, -rash, -pallor  Allergies: -environmental allergies, -food allergies  Neuro: -HA, -numbness, -weakness, -lightheadedness, -dizziness  Psych: deferred    All other systems negative other than reported in  HPI      All other systems reviewed and negative unless otherwise noted in the HPI or above. This was done per my custom and practice for systems appropriate to the chief complaint in an emergency department setting and varies depending on the quality of history that the patient is able to provide.      Physical Exam:   10/19/14  1342   BP: 156/87   Pulse: 81   Resp: 18   Temp: 98.3 F (36.8 C)   SpO2: 98%     Nursing note and vitals reviewed.     Physical Exam   Physical Exam:  Constitutional: NAD, no distress, no diaphoresis, well nourished  HENT: normocephalic, atraumatic  Eyes: PERRL, conjunctiva normal, EOMI  Neck: ROM normal, no JVD  Resp: Regular rate, no wheeze, no rales, no distress  CV: RRR, no M/R/G  Abd: Soft, NT, ND, normal BS  GU: deferred  MSK: moving all four extremities spontaneously, no edema, no deformity; +ttp over anterior shoulder, extension and abduction of shoulder limited to 90 degrees before discomfort, +empty can,  +lift off  Skin: No pallor, no erythema, no rash  Neuro: AOx3, alert, CNs grossly intact, left axillary nerve function intact  Psych: Normal mood, normal behavior, judgment normal, normal thought content    Nursing notes and vital signs reviewed      Impression & Initial ED Plan:  49 year old right handed female with a PMH significant for HTN, obesity presents with left shoulder pain. Ddx includes sprain/strain v fracture. Doubtful dislocation given physical exam. Plan for xr shoulder and po pain control. Likely rotator cuff sprain v strain and patient has been instructed on exercises to maintain rom and function of shoulder.      The rest of the ED course, results, and plan for the patient is in a separate continuation note. Please see that note for details.       I have discussed my evaluation and care plan for the patient with the attending physician Dr. Andrey Campanile.     Forde Dandy, MD  Resident  10/20/14 [phone]       Ann Maki., MD  10/25/14 2109

## 2014-10-26 NOTE — ED Follow-up Note (Signed)
Follow-up type: Callback       Routine ED Patient Call Back    Patient contacted by telephone:  found no issues; patient doing fine.

## 2014-11-03 ENCOUNTER — Telehealth (HOSPITAL_BASED_OUTPATIENT_CLINIC_OR_DEPARTMENT_OTHER): Payer: Self-pay

## 2014-11-03 ENCOUNTER — Ambulatory Visit (HOSPITAL_BASED_OUTPATIENT_CLINIC_OR_DEPARTMENT_OTHER): Payer: Medicaid Other | Admitting: Surgery

## 2014-11-03 NOTE — Telephone Encounter (Signed)
Called patient and LVM to call us back, patient missed her appt on Wednesday 08/24 General Surgery, called to see if she'd like to reschedule

## 2015-02-27 ENCOUNTER — Emergency Department
Admission: EM | Admit: 2015-02-27 | Discharge: 2015-02-27 | Disposition: A | Discharge status: Home / Self Care | Payer: Medicaid Other | Attending: Emergency Medicine | Admitting: Emergency Medicine

## 2015-02-27 ENCOUNTER — Encounter (HOSPITAL_COMMUNITY): Payer: Self-pay

## 2015-02-27 DIAGNOSIS — R319 Hematuria, unspecified: Secondary | ICD-10-CM

## 2015-02-27 DIAGNOSIS — N390 Urinary tract infection, site not specified: Secondary | ICD-10-CM | POA: Insufficient documentation

## 2015-02-27 DIAGNOSIS — K219 Gastro-esophageal reflux disease without esophagitis: Secondary | ICD-10-CM | POA: Insufficient documentation

## 2015-02-27 DIAGNOSIS — Z87891 Personal history of nicotine dependence: Secondary | ICD-10-CM | POA: Insufficient documentation

## 2015-02-27 DIAGNOSIS — I10 Essential (primary) hypertension: Secondary | ICD-10-CM | POA: Insufficient documentation

## 2015-02-27 LAB — URINALYSIS WITH CULTURE REFLEX, WHEN INDICATED
Bilirubin: NEGATIVE
Glucose: NEGATIVE
Ketones: NEGATIVE
Nitrite: NEGATIVE
RBC: >50 — AB (ref 0–?)
Specific Gravity: 1.018 (ref 1.002–1.030)
Urobilinogen: NEGATIVE
WBC: >50 — AB (ref 0–?)
pH: 6 (ref 5.0–8.0)

## 2015-02-27 MED ORDER — SULFAMETHOXAZOLE-TRIMETHOPRIM 800-160 MG OR TABS
1.00 | ORAL_TABLET | Freq: Once | ORAL | Status: AC
Start: 2015-02-27 — End: 2015-02-27
  Administered 2015-02-27: 1 via ORAL
  Filled 2015-02-27: qty 1

## 2015-02-27 MED ORDER — SULFAMETHOXAZOLE-TRIMETHOPRIM 800-160 MG OR TABS
1.0000 | ORAL_TABLET | Freq: Two times a day (BID) | ORAL | 0 refills | Status: DC
Start: 2015-02-27 — End: 2015-03-21

## 2015-02-27 NOTE — Discharge Instructions (Signed)
1.  Please follow up with your primary care physician in 2-3 days to evaluate how you are doing.      Urinary Tract Infection    You have been diagnosed with a lower urinary tract infection (UTI). This is also called cystitis.    Cystitis is an infection in your bladder. Your doctor diagnosed it by testing your urine. Cystitis usually causes burning with urination or frequent urination. It might make you feel like you have to urinate even when you dont.     Cystitis is usually treated with antibiotics and medicine to help with pain.    It is VERY IMPORTANT that you fill your prescription and take all of the antibiotics as directed. If a lower urinary tract infection goes untreated for too long, it can become a kidney infection.    FOR WOMEN: To reduce the risk of getting cystitis again:   Always urinate before and after sexual intercourse.   Always wipe from front to back after urinating or having a bowel movement. Do not wipe from back to front.   Drink plenty of fluids. Try to drink cranberry or blueberry juice. These juices have a chemical that stops bacteria from sticking to the bladder.    YOU SHOULD SEEK MEDICAL ATTENTION IMMEDIATELY, EITHER HERE OR AT THE NEAREST EMERGENCY DEPARTMENT, IF ANY OF THE FOLLOWING OCCURS:   You have a fever (temperature higher than 100.50F / 38C) or shaking chills.   You feel nauseated or vomit.   You have pain in your side or back.   You dont get better after taking all of your antibiotics.   You have any new symptoms or concerns.   You feel worse or do not improve.

## 2015-02-27 NOTE — ED Notes (Signed)
Patient C/O urinary frequency and dysuria worse since Thursday. Denies vaginal discharge or blood in urine.

## 2015-02-27 NOTE — ED Notes (Signed)
Aftercare instructions explained to patient. Patient diagnosed with UTI with hematuria. Patient given script for bactrim DS. Instructed on use of medications. Patient informed to return to ED for persistent, severe, worsening symptoms, fevers/chills, or any other concerns. Patient verbalized understanding. Patient ambulated with steady gait out of ED.

## 2015-02-27 NOTE — ED Provider Notes (Signed)
Emergency Department Note  [facility] electronic medical record reviewed for pertinent medical history.     Nursing Triage Note:   Chief Complaint   Patient presents with    Urinary Frequency     Patient here with c/o frequent urination x 3 days with associated burning sensation. She denies n/v/d/f/c. +hematuria yesterday       HPI:   49 year old female with a PMH significant for GERD, HTN, S/P Cholecycstectomy p/w concerns for bladder infection. Endorses abnormal odor, frequency, urgency, w/ tingling sensation since Thursday and endorses small vol pink urine yesterday noticed when wiping x 1.  Patient has been drinking water and cranberry juice for symptoms. She denies back/flank/abd pain, denies F/C/N/V/D.  She endorses UTI years ago. She denies vaginal pain, discharge, bleeding, lmp was 21st of Oct and she endorses menopause at this time.              HPI    Past Medical History   Diagnosis Date    GERD (gastroesophageal reflux disease)     HTN (hypertension)        Past Surgical History   Procedure Laterality Date    Shoulder arthroscopy w/ rotator cuff repair Right     Nose surgery      Tubal ligation      Cholecystectomy         Family History:      Family History   Problem Relation Age of Onset    Heart Attack Neg Hx        Social History:  -tob, -EtOH, -illegal drug use.   lives with family in [place].     Social History   Substance Use Topics    Smoking status: Former Smoker     Quit date: 08/29/2001    Smokeless tobacco: Never Used    Alcohol use No       Medications:   Prior to Admission Medications   Prescriptions Last Dose Informant Patient Reported? Taking?   HYDROcodone-acetaminophen (NORCO) 5-325 MG tablet   No No   Sig: Take 1 tablet by mouth every 6 hours as needed for Moderate Pain (Pain Score 4-6).   amLODIPINE (NORVASC) 10 MG tablet   Yes No   Sig: Take 10 mg by mouth daily.   diphenhydrAMINE (BENADRYL) 25 MG tablet   No No   Sig: Take 1 tablet (25 mg) by mouth every 6 hours as needed for  Itching.   docusate sodium (COLACE) 250 MG capsule   No No   Sig: Take 1 capsule (250 mg) by mouth daily.   famotidine (PEPCID) 20 MG tablet   No No   Sig: Take 1 tablet (20 mg) by mouth 2 times daily.   hydrocortisone 0.5 % cream   No No   Sig: Apply 1 Application topically 2 times daily. Use a small amount as directed   naproxen (NAPROSYN) 250 MG tablet   No No   Sig: Take 1 tablet (250 mg) by mouth 2 times daily (with meals).   omeprazole (PRILOSEC) 40 MG capsule   No No   Sig: Take 1 capsule (40 mg) by mouth daily.   ondansetron (ZOFRAN ODT) 4 MG disintegrating tablet   No No   Sig: Take 1 tablet (4 mg) by mouth every 8 hours as needed for Nausea/Vomiting.      Facility-Administered Medications: None       Allergies: Tea tree oil and Pcn [penicillins]    Review of Systems:  Constitutional: negative for weight change  Eyes: negative for visual loss  Ears, Nose, Mouth, Throat: negative for difficulty swallowing  CV: negative for palpitations, syncope,   Resp: negative for cough, shortness of breath, pleuritic pain, dyspnea on exertion.  GI: negative for melena  GU: negative for incontinence  Musculoskeletal:negative for muscle weakness.  Integumentary: negative for rash.  Neuro: negative for paralysis/weakness.  Psych :negative for depressed mood.        Review of Systems  All other systems reviewed and negative unless otherwise noted in the HPI or above. This was done per my custom and practice for systems appropriate to the chief complaint in an emergency department setting and varies depending on the quality of history that the patient is able to provide.      Physical Exam:   02/27/15  0624   BP: 152/71   Pulse: 85   Resp: 16   Temp: 98.8 F (37.1 C)   SpO2: 100%     Nursing note and vitals reviewed.     Physical Exam  Physical Exam:  GENERAL: NAD, well-appearing, pleasant, alert and oriented  HEENT: NCAT, anicteric, OP clear, MMM, No cervical lymphadenopathy,   CARDIAC: RRR, normal S1 S2, no m/r/g   RESP:  clear to auscultation bilaterally, no w/r/c, symmetric expansion of chest, no use of accessory muscles,   ABDOMEN: soft, suprapubic tenderness, non-distended. No CVA tenderness.  NO rebound or guarding  EXTREMITIES: warm, well perfused, no edema,   NEURO: Cranial nerves II-XII grossly intact.  No gross motor deficits    SKIN: No rash, vesicles, pustules; no skin abrasions, lesions noted    Impression & Initial ED Plan:  49 year old  female presents with concerns for urinary tract infection.  Patient endorses frequency, urgency and dysuria.  Suspect UTI/cystitis.  Lower concern for pyelonephritis vs. Nephrolithiasis.  Also low concern for gyn pathology such as TOA vs. Torsion vs. Ruptured cyst.  Patient denies vaginal infection complaints.  Also low concern for lower abd pathology such as appendicitis vs diverticulitis.  Will check for prior urine cultures and treat for uncomplicated UTI.      Orders Placed This Encounter   Procedures    Urinalysis with Culture Reflex, when indicated     Medications - No data to display        The rest of the ED course, results, and plan for the patient is in a separate continuation note. Please see that note for details.       I have discussed my evaluation and care plan for the patient with the attending physician Dr. Lajuana Ripple, Helmut Muster.     Darragh Nay, Regan Rakers, MD  Resident  02/27/15 0703       Minns, Rosaria Ferries, MD  02/27/15 [phone]

## 2015-02-27 NOTE — ED Notes (Signed)
20F frequent urination x3 days, burning on urination/hematuria yesterday

## 2015-02-28 NOTE — ED Follow-up Note (Signed)
Follow-up type: Callback       Routine ED Patient Call Back    Patient unable to be contacted, no message left

## 2015-03-01 LAB — URINE CULTURE: Urine Culture Result: <10000 — AB

## 2015-03-01 NOTE — ED Follow-up Note (Signed)
+   urine culture, Staphylococcus saprophyticus  >100,000 colonies/ml.  Patient prescribed bactrim.   Outpatient medications reviewed for interactions.  No changes to recommend.    Urine Culture Staphylococcus saprophyticus   >100,000 colonies/mL   Identification performed by Mass Spectrometry( Maldi-ToF).   This test was developed and its performance characteristics   determined by [facility]. It   has not been cleared or approved by the U.S. Food and Drug   Administration. The FDA has determined that such clearance   or approval is not necessary.   Routine testing of urine isolates of S.saprophyticus   is not advised, because infections respond to   concentrations achieved in urine of antimicrobial   agents commonly used to treat acute, uncomplicated   urinary tract infections (e.g. nitrofurantoin,   trimethoprim/sulfamethoxazole, or fluoroquinolone).

## 2015-03-21 ENCOUNTER — Encounter (HOSPITAL_COMMUNITY): Payer: Self-pay | Admitting: Emergency Medicine

## 2015-03-21 ENCOUNTER — Emergency Department
Admission: EM | Admit: 2015-03-21 | Discharge: 2015-03-21 | Disposition: A | Discharge status: Home / Self Care | Payer: Medicaid Other | Attending: Emergency Medicine | Admitting: Emergency Medicine

## 2015-03-21 DIAGNOSIS — Z880 Allergy status to penicillin: Secondary | ICD-10-CM | POA: Insufficient documentation

## 2015-03-21 DIAGNOSIS — K219 Gastro-esophageal reflux disease without esophagitis: Secondary | ICD-10-CM | POA: Insufficient documentation

## 2015-03-21 DIAGNOSIS — R51 Headache: Secondary | ICD-10-CM | POA: Insufficient documentation

## 2015-03-21 DIAGNOSIS — Z87891 Personal history of nicotine dependence: Secondary | ICD-10-CM | POA: Insufficient documentation

## 2015-03-21 DIAGNOSIS — R519 Headache, unspecified: Secondary | ICD-10-CM

## 2015-03-21 DIAGNOSIS — I10 Essential (primary) hypertension: Secondary | ICD-10-CM | POA: Insufficient documentation

## 2015-03-21 DIAGNOSIS — E876 Hypokalemia: Secondary | ICD-10-CM | POA: Insufficient documentation

## 2015-03-21 LAB — CBC WITH DIFF, BLOOD
ANC-Automated: 3.8 10*3/uL (ref 1.6–7.0)
Abs Eosinophils: 0.1 10*3/uL (ref 0.1–0.5)
Abs Lymphs: 3.5 10*3/uL — ABNORMAL HIGH (ref 0.8–3.1)
Abs Monos: 0.6 10*3/uL (ref 0.2–0.8)
Basophils: 1 %
Eosinophils: 1 %
Hct: 40.1 % (ref 34.0–45.0)
Hgb: 13.7 gm/dL (ref 11.2–15.7)
Lymphocytes: 44 %
MCH: 29.7 pg (ref 26.0–32.0)
MCHC: 34.2 % (ref 32.0–36.0)
MCV: 87 um3 (ref 79.0–95.0)
MPV: 9.2 fL — ABNORMAL LOW (ref 9.4–12.4)
Monocytes: 7 %
Plt Count: 410 10*3/uL — ABNORMAL HIGH (ref 140–370)
RBC: 4.61 10*6/uL (ref 3.90–5.20)
RDW: 12.6 % (ref 12.0–14.0)
Segs: 47 %
WBC: 8.1 10*3/uL (ref 4.0–10.0)

## 2015-03-21 LAB — BASIC METABOLIC PANEL, BLOOD
Anion Gap: 20 mmol/L — ABNORMAL HIGH (ref 7–15)
BUN: 15 mg/dL (ref 6–20)
Bicarbonate: 24 mmol/L (ref 22–29)
Calcium: 10 mg/dL (ref 8.5–10.6)
Chloride: 98 mmol/L (ref 98–107)
Creatinine: 0.61 mg/dL (ref 0.51–0.95)
GFR: >60 mL/min
Glucose: 101 mg/dL — ABNORMAL HIGH (ref 70–99)
Potassium: 3.2 mmol/L — ABNORMAL LOW (ref 3.5–5.1)
Sodium: 142 mmol/L (ref 136–145)

## 2015-03-21 LAB — TROPONIN T, BLOOD: Troponin T: <0.01 ng/mL (ref <0.01)

## 2015-03-21 MED ORDER — HYDROCHLOROTHIAZIDE 12.5 MG OR CAPS: 12.50 mg | ORAL_CAPSULE | Freq: Every day | ORAL | Status: AC

## 2015-03-21 MED ORDER — POTASSIUM CHLORIDE CRYS CR 10 MEQ OR TBCR
40.0000 meq | EXTENDED_RELEASE_TABLET | Freq: Once | ORAL | Status: AC
Start: 2015-03-21 — End: 2015-03-21
  Administered 2015-03-21: 40 meq via ORAL
  Filled 2015-03-21: qty 4

## 2015-03-21 MED ORDER — CLONIDINE HCL 0.1 MG OR TABS
0.1000 mg | ORAL_TABLET | Freq: Once | ORAL | Status: AC
Start: 2015-03-21 — End: 2015-03-21
  Administered 2015-03-21: 0.1 mg via ORAL
  Filled 2015-03-21: qty 1

## 2015-03-21 NOTE — ED Notes (Signed)
Assisting primary rn  - labs drawn and sent

## 2015-03-21 NOTE — ED Notes (Signed)
03/21/2015 8:15 PM [facility] Police    An EKG was handed to Dr. Gweneth Dimitri with prior from [facility] and copy placed in chart.

## 2015-03-21 NOTE — ED MD Progress Note (Signed)
Bmp with hypokalemia, given K. ekg with possible q wave isolated to III, trop sent and neg. No cp or sob, do not think she has ACS. Cbc with no leukocytosis. No e/o end organ damage. BP improved a little after clonidine, HA gone. Neuro exam unchanged. D/c with f/u to PMD in 2 days.

## 2015-03-21 NOTE — Discharge Instructions (Signed)
Discuss increasing your hydrochlorothiazide with your primary doctor this Wednesday. Your potassium is a little low, eat foods high in potassium such as bananas, avocados, or spinach.     Hypertension, Exacerbation    You have been previously diagnosed with hypertension. Hypertension means high blood pressure that happens every day. To be diagnosed with hypertension, the blood pressure readings must be abnormally high at least 3 different times. There are causes for high blood pressure that doctors can find. These include being overweight or having a kidney or hormone problem. This is called secondary hypertension. When a doctor does not know the cause, it is called essential hypertension. Both types of hypertension may require medicine to lower the blood pressure. Some people with high blood pressure will improve if they limit the sodium (salt) in their diets.    High blood pressure often has no symptoms. However, it can cause headaches or vision problems. In rare cases, very high blood pressure can cause seizures (fits). It is important to diagnose and treat hypertension even if there are no symptoms. This is because high blood pressure can cause damage to organs like the heart and kidneys. This damage can be permanent (not go away). That is why it is very important to take all medicines prescribed for your condition.    See your doctor in the next 24 hours.     YOU SHOULD SEEK MEDICAL ATTENTION IMMEDIATELY, EITHER HERE OR AT THE NEAREST EMERGENCY DEPARTMENT IF ANY OF THE FOLLOWING OCCUR:   Your chest hurts or you have trouble breathing.   You have a severe headache or have trouble seeing.   You have seizures.   You vomit (throw up) repeatedly or get more ill.

## 2015-03-21 NOTE — ED Provider Notes (Signed)
Emergency Department Note   [facility] electronic medical record reviewed for pertinent medical history.       Chief Complaint   Patient presents with   . Hypertension     She states that she was told to come to the ER for evaluation of elecated b/p. She states that she was being seen by her dentist where she was told to come to ER prior to having dental care.          HPI:  50 year old female with a pmhx significant for HTN presenting with elevated BP. Reports 3 days ago bp was 160's/90's, very elevated for her. Since then each time she checked it has been elevated in the same range. 3 days ago so pmd who started hctz 12.5 mg daily and said to go to ED. Today she decided to present to ED because her numbers have not improved. On ROS she also notes a "mild" HA, pressure-like, diffuse, resolves with Excedrin, worse with her BP being elevated. Has had identical HA in past when elevated. Denies dizziness, weakness, loc, imbalance, cp, or sob.        Past Medical History   Diagnosis Date   . GERD (gastroesophageal reflux disease)    . HTN (hypertension)          Past Surgical History   Procedure Laterality Date   . Shoulder arthroscopy w/ rotator cuff repair Right    . Nose surgery     . Tubal ligation     . Cholecystectomy     . Pb rpr spigelian hernia         Family History:    Family History   Problem Relation Age of Onset   . Heart Attack Neg Hx        Social History:     Social History   Substance Use Topics   . Smoking status: Former Smoker     Quit date: 08/29/2001   . Smokeless tobacco: Never Used   . Alcohol use No         Home Medications:      amLODIPINE (NORVASC) 10 MG tablet Take 10 mg by mouth daily.   hydrochlorothiazide (MICROZIDE) 12.5 MG capsule Take 12.5 mg by mouth daily.       Allergies: Tea tree oil and Pcn [penicillins]      Review of Systems:   Gen: Denies fevers, chills  Head: + ha  Eyes: Denies vision changes  Nose: Denies rhinorrhea or congestion  CV: Denies CP, palpitations  Lung: Denies SOB,  cough  Abd: Denies abd pain, vomiting  GU: Denies hematuria, dysuria  Psych: Denies SI, HI, or AVH  [place]: Denies weakness or numbness    All other systems reviewed and negative unless otherwise noted in the HPI or above. This was done per my custom and practice for systems appropriate to the chief complaint in an emergency department setting and varies depending on the quality of history that the patient is able to provide      Physical Exam:   03/21/15  1606 03/21/15  1842 03/21/15  1950   BP: 139/89 160/86 161/89   Pulse: 75 78 79   Resp: 16 16    Temp: 98.2 F (36.8 C) 98.6 F (37 C)    SpO2: 99% 99%        Nursing notes and vitals reviewed. Afebrile, hypertensive, nontachycardic, not hypoxic.      General: NAD, nontoxic  HEENT:PERRL, MMM   Neck: no  neck stiffness or tenderness   CV: rrr, no m/r/g   Pulm: ctab, no increased WOB   Abd: NT/ND, normoactive bs   Extr: No deformities or pain   Skin: No bruising or rashes   Neuro: AAOx3, CN - sensation v1-v3 to light touch symmetric. Facial motor symmetric. No tongue deviation. Normal phonation.   Motor: 5/5 strength in b/l finger grip, hip flexion, and ankle dorsi/plantarflexion.    Sensation: sensation in tact to light touch and pain in all extremities   Gait -normal regular gait   Cerebellar/brainstem - Normal finger-to-nose bilaterally. Normal rapid alternating movements in bilateral hands   Naming, repitition, registration, and concentration normal   Psych: mood/affect appropriate, linear thinking.        Assessment & Plan:  50 year old female with HTN above normal values to 160/90's, symptomatic with HA. Doubt CVA, SAH (not worst of life, "mild"), or hypertensive emergency. Just started diuretic 3 days ago, will check creatinine. Cbc. EKG    Clonidine 0.1 mg to help with BP which may alleviate HD.  reassess    EKG from 20:10 with NSR, HR 67, normal axis, tw flattening in v2 and II unchanged from prior. Q wave in III, no other leads  Due to new possible q wave  in III, will sent trop. If negative with single level will not pursue further in absence of cp or sob.      The rest of the ED course, results, and plan for the patient is in a separate continuation note. Please see that note for details.       I have discussed my evaluation and care plan for the patient with the attending physician.               Melida Gimenez, MD  Resident  03/21/15 2025       Corine Shelter, MD  03/22/15 Marlyne Beards

## 2015-03-21 NOTE — ED Notes (Signed)
Assisting primary rn  - Pt is aa&ox3, denies any complaints at this time. Dc instructions provided with verbalized understanding to follow-up with her PMD and to return sooner with any problems/concerns. Pt ambulated out of the ED with steady/even gait.

## 2015-03-22 LAB — ECG 12-LEAD
ATRIAL RATE: 67 {beats}/min
ECG INTERPRETATION: NORMAL
P AXIS: 47 degrees
PR INTERVAL: 158 ms
QRS INTERVAL/DURATION: 78 ms
QT: 414 ms
QTC INTERVAL: 437 ms
R AXIS: 10 degrees
T AXIS: 21 degrees
VENTRICULAR RATE: 67 {beats}/min

## 2015-03-23 NOTE — ED Follow-up Note (Signed)
Follow-up type: Callback       Routine ED Patient Call Back    Patient unable to be contacted, no message left

## 2015-05-25 ENCOUNTER — Ambulatory Visit: Payer: Self-pay | Admitting: Internal Medicine

## 2015-06-08 ENCOUNTER — Ambulatory Visit (INDEPENDENT_AMBULATORY_CARE_PROVIDER_SITE_OTHER): Payer: BLUE CROSS/BLUE SHIELD | Admitting: Family Medicine

## 2015-06-08 VITALS — BP 140/80 | HR 77 | Temp 98.4°F | Resp 20 | Ht 66.0 in | Wt 236.0 lb

## 2015-06-08 DIAGNOSIS — I10 Essential (primary) hypertension: Secondary | ICD-10-CM

## 2015-06-08 DIAGNOSIS — M75102 Unspecified rotator cuff tear or rupture of left shoulder, not specified as traumatic: Secondary | ICD-10-CM

## 2015-06-08 MED ORDER — AMLODIPINE BESYLATE 10 MG PO TABS: 10.0000 mg | ORAL_TABLET | Freq: Every day | ORAL | Status: DC

## 2015-06-08 MED ORDER — LISINOPRIL 10 MG PO TABS: 10.0000 mg | ORAL_TABLET | Freq: Every day | ORAL | Status: DC

## 2015-06-08 MED ORDER — HYDROCHLOROTHIAZIDE 12.5 MG PO CAPS: 12.5000 mg | ORAL_CAPSULE | Freq: Every day | ORAL | Status: DC

## 2015-06-08 NOTE — Patient Instructions (Addendum)
Call [facility] for the compression stockings: Thigh length, beige or black, open or closed toe, compression 20-30, medium [phone]  Work on the [employer]

## 2015-06-08 NOTE — Progress Notes (Signed)
By signing my name below, I, Stann Oresung-Kai Tsai, attest that this documentation has been prepared under the direction and in the presence of Elvina SidleKurt Lela Murfin, MD. Electronically Signed: Stann Oresung-Kai Tsai, Scribe. 06/08/2015 , 2:36 PM .  Patient was seen in room 8 .   Patient ID: Kayla Moody MRN: [id number], DOB: [birth year], 50 y.o. Date of Encounter: 06/08/2015  Primary Physician: No primary care provider on file.  Chief Complaint:  Chief Complaint  Patient presents with  . Medication Refill    BP meds    HPI:  Kayla Moody is a 50 y.o. female who has history of HTN presents to [facility] for medication refill of BP medication. Patient recently moved here 2 months ago. She ran out of her medications yesterday. She used have have headaches but not any recently. She was diagnosed for HTN 20 years ago. She denies any shortness of breath, or chest pain. She noticed some left calf swelling while at work. After she went home, she elevated her leg and it was resolved.   She's looking to join a gym here in town. She denies drinking soda.   She has a history of injuring her left shoulder.   She also takes medication for cholesterol.   [place]  She currently works as a Conservation officer, naturecashier at [employer].   Past Medical History  Diagnosis Date  . Allergy   . Hypertension      Home Meds: Prior to Admission medications   Medication Sig Start Date End Date Taking? Authorizing Provider  amLODipine (NORVASC) 10 MG tablet Take 10 mg by mouth daily.   Yes Historical Provider, MD  hydrochlorothiazide (MICROZIDE) 12.5 MG capsule Take 12.5 mg by mouth daily.   Yes Historical Provider, MD  lisinopril (PRINIVIL,ZESTRIL) 10 MG tablet Take 10 mg by mouth daily.   Yes Historical Provider, MD    Allergies:  Allergies  Allergen Reactions  . Penicillins     Social History   Social History  . Marital Status: Married    Spouse Name: N/A  . Number of Children: N/A  .  Years of Education: N/A   Occupational History  . Not on file.   Social History Main Topics  . Smoking status: Never Smoker   . Smokeless tobacco: Not on file  . Alcohol Use: No  . Drug Use: No  . Sexual Activity: Not on file   Other Topics Concern  . Not on file   Social History Narrative  . No narrative on file     Review of Systems: Constitutional: negative for fever, chills, night sweats, weight changes, or fatigue  HEENT: negative for vision changes, hearing loss, congestion, rhinorrhea, ST, epistaxis, or sinus pressure Cardiovascular: negative for chest pain or palpitations Respiratory: negative for hemoptysis, wheezing, shortness of breath, or cough Abdominal: negative for abdominal pain, nausea, vomiting, diarrhea, or constipation Dermatological: negative for rash Neurologic: negative for headache, dizziness, or syncope All other systems reviewed and are otherwise negative with the exception to those above and in the HPI.  Physical Exam: Blood pressure 140/80, pulse 77, temperature 98.4 F (36.9 C), temperature source Oral, resp. rate 20, height 5\' 6"  (1.676 m), weight 236 lb (107.049 kg), SpO2 99 %., Body mass index is 38.11 kg/(m^2). General: Well developed, well nourished, in no acute distress. Head: Normocephalic, atraumatic, eyes without discharge, sclera non-icteric, nares are without discharge. Bilateral auditory canals clear, TM's are without perforation, pearly grey and translucent with reflective cone of light  bilaterally. Oral cavity moist, posterior pharynx without exudate, erythema, peritonsillar abscess, or post nasal drip.  Neck: Supple. No thyromegaly. Full ROM. No lymphadenopathy. Lungs: Clear bilaterally to auscultation without wheezes, rales, or rhonchi. Breathing is unlabored. Heart: RRR with S1 S2. No murmurs, rubs, or gallops appreciated. Abdomen: Soft, non-tender, non-distended with normoactive bowel sounds. No hepatomegaly. No rebound/guarding. No  obvious abdominal masses. Msk:  Strength and tone normal for age; left shoulder can't abduct without pain  Extremities/Skin: Warm and dry. No clubbing or cyanosis. No edema. No rashes or suspicious lesions. Neuro: Alert and oriented X 3. Moves all extremities spontaneously. Gait is normal. CNII-XII grossly in tact. Psych:  Responds to questions appropriately with a normal affect.   Labs:  ASSESSMENT AND PLAN:  50 y.o. year old female with Essential hypertension - Plan: lisinopril (PRINIVIL,ZESTRIL) 10 MG tablet, hydrochlorothiazide (MICROZIDE) 12.5 MG capsule, amLODipine (NORVASC) 10 MG tablet  Left rotator cuff tear - Plan: Ambulatory referral to Physical Therapy  This chart was scribed in my presence and reviewed by me personally.     Signed, Elvina Sidle, MD 06/08/2015 2:39 PM

## 2015-06-16 DIAGNOSIS — M25512 Pain in left shoulder: Secondary | ICD-10-CM | POA: Diagnosis not present

## 2015-06-30 DIAGNOSIS — M71572 Other bursitis, not elsewhere classified, left ankle and foot: Secondary | ICD-10-CM | POA: Diagnosis not present

## 2015-06-30 DIAGNOSIS — M76822 Posterior tibial tendinitis, left leg: Secondary | ICD-10-CM | POA: Diagnosis not present

## 2015-06-30 DIAGNOSIS — M722 Plantar fascial fibromatosis: Secondary | ICD-10-CM | POA: Diagnosis not present

## 2015-06-30 DIAGNOSIS — M7732 Calcaneal spur, left foot: Secondary | ICD-10-CM | POA: Diagnosis not present

## 2015-07-05 DIAGNOSIS — M25512 Pain in left shoulder: Secondary | ICD-10-CM | POA: Diagnosis not present

## 2015-09-21 DIAGNOSIS — S46012A Strain of muscle(s) and tendon(s) of the rotator cuff of left shoulder, initial encounter: Secondary | ICD-10-CM | POA: Diagnosis not present

## 2015-09-21 DIAGNOSIS — S43431A Superior glenoid labrum lesion of right shoulder, initial encounter: Secondary | ICD-10-CM | POA: Diagnosis not present

## 2015-09-21 DIAGNOSIS — G8918 Other acute postprocedural pain: Secondary | ICD-10-CM | POA: Diagnosis not present

## 2015-09-21 DIAGNOSIS — M75121 Complete rotator cuff tear or rupture of right shoulder, not specified as traumatic: Secondary | ICD-10-CM | POA: Diagnosis not present

## 2015-09-21 DIAGNOSIS — Y929 Unspecified place or not applicable: Secondary | ICD-10-CM | POA: Diagnosis not present

## 2015-09-21 DIAGNOSIS — M7541 Impingement syndrome of right shoulder: Secondary | ICD-10-CM | POA: Diagnosis not present

## 2015-09-21 DIAGNOSIS — Y939 Activity, unspecified: Secondary | ICD-10-CM | POA: Diagnosis not present

## 2015-09-21 DIAGNOSIS — Y999 Unspecified external cause status: Secondary | ICD-10-CM | POA: Diagnosis not present

## 2015-09-21 DIAGNOSIS — M24112 Other articular cartilage disorders, left shoulder: Secondary | ICD-10-CM | POA: Diagnosis not present

## 2015-09-26 DIAGNOSIS — S46012D Strain of muscle(s) and tendon(s) of the rotator cuff of left shoulder, subsequent encounter: Secondary | ICD-10-CM | POA: Diagnosis not present

## 2015-09-26 DIAGNOSIS — R531 Weakness: Secondary | ICD-10-CM | POA: Diagnosis not present

## 2015-09-26 DIAGNOSIS — M25512 Pain in left shoulder: Secondary | ICD-10-CM | POA: Diagnosis not present

## 2015-09-27 DIAGNOSIS — M24112 Other articular cartilage disorders, left shoulder: Secondary | ICD-10-CM | POA: Diagnosis not present

## 2015-10-13 DIAGNOSIS — M25512 Pain in left shoulder: Secondary | ICD-10-CM | POA: Diagnosis not present

## 2015-10-13 DIAGNOSIS — R531 Weakness: Secondary | ICD-10-CM | POA: Diagnosis not present

## 2015-10-13 DIAGNOSIS — S46012D Strain of muscle(s) and tendon(s) of the rotator cuff of left shoulder, subsequent encounter: Secondary | ICD-10-CM | POA: Diagnosis not present

## 2015-10-17 DIAGNOSIS — M25512 Pain in left shoulder: Secondary | ICD-10-CM | POA: Diagnosis not present

## 2015-10-21 DIAGNOSIS — M25512 Pain in left shoulder: Secondary | ICD-10-CM | POA: Diagnosis not present

## 2015-10-21 DIAGNOSIS — R531 Weakness: Secondary | ICD-10-CM | POA: Diagnosis not present

## 2015-10-21 DIAGNOSIS — S46012D Strain of muscle(s) and tendon(s) of the rotator cuff of left shoulder, subsequent encounter: Secondary | ICD-10-CM | POA: Diagnosis not present

## 2015-10-26 DIAGNOSIS — R531 Weakness: Secondary | ICD-10-CM | POA: Diagnosis not present

## 2015-10-26 DIAGNOSIS — S46012D Strain of muscle(s) and tendon(s) of the rotator cuff of left shoulder, subsequent encounter: Secondary | ICD-10-CM | POA: Diagnosis not present

## 2015-10-26 DIAGNOSIS — M25512 Pain in left shoulder: Secondary | ICD-10-CM | POA: Diagnosis not present

## 2015-10-28 DIAGNOSIS — S46012D Strain of muscle(s) and tendon(s) of the rotator cuff of left shoulder, subsequent encounter: Secondary | ICD-10-CM | POA: Diagnosis not present

## 2015-10-28 DIAGNOSIS — M25512 Pain in left shoulder: Secondary | ICD-10-CM | POA: Diagnosis not present

## 2015-10-28 DIAGNOSIS — R531 Weakness: Secondary | ICD-10-CM | POA: Diagnosis not present

## 2015-11-01 DIAGNOSIS — S46012D Strain of muscle(s) and tendon(s) of the rotator cuff of left shoulder, subsequent encounter: Secondary | ICD-10-CM | POA: Diagnosis not present

## 2015-11-01 DIAGNOSIS — M25512 Pain in left shoulder: Secondary | ICD-10-CM | POA: Diagnosis not present

## 2015-11-01 DIAGNOSIS — R531 Weakness: Secondary | ICD-10-CM | POA: Diagnosis not present

## 2015-11-03 DIAGNOSIS — M25512 Pain in left shoulder: Secondary | ICD-10-CM | POA: Diagnosis not present

## 2015-11-03 DIAGNOSIS — S46012D Strain of muscle(s) and tendon(s) of the rotator cuff of left shoulder, subsequent encounter: Secondary | ICD-10-CM | POA: Diagnosis not present

## 2015-11-03 DIAGNOSIS — R531 Weakness: Secondary | ICD-10-CM | POA: Diagnosis not present

## 2015-11-10 DIAGNOSIS — S46012D Strain of muscle(s) and tendon(s) of the rotator cuff of left shoulder, subsequent encounter: Secondary | ICD-10-CM | POA: Diagnosis not present

## 2015-11-10 DIAGNOSIS — R531 Weakness: Secondary | ICD-10-CM | POA: Diagnosis not present

## 2015-11-10 DIAGNOSIS — M25512 Pain in left shoulder: Secondary | ICD-10-CM | POA: Diagnosis not present

## 2015-11-15 DIAGNOSIS — S46012D Strain of muscle(s) and tendon(s) of the rotator cuff of left shoulder, subsequent encounter: Secondary | ICD-10-CM | POA: Diagnosis not present

## 2015-11-15 DIAGNOSIS — M25512 Pain in left shoulder: Secondary | ICD-10-CM | POA: Diagnosis not present

## 2015-11-15 DIAGNOSIS — R531 Weakness: Secondary | ICD-10-CM | POA: Diagnosis not present

## 2015-11-21 DIAGNOSIS — S46012D Strain of muscle(s) and tendon(s) of the rotator cuff of left shoulder, subsequent encounter: Secondary | ICD-10-CM | POA: Diagnosis not present

## 2015-11-21 DIAGNOSIS — R531 Weakness: Secondary | ICD-10-CM | POA: Diagnosis not present

## 2015-11-21 DIAGNOSIS — M25512 Pain in left shoulder: Secondary | ICD-10-CM | POA: Diagnosis not present

## 2015-12-13 DIAGNOSIS — M25512 Pain in left shoulder: Secondary | ICD-10-CM | POA: Diagnosis not present

## 2015-12-13 DIAGNOSIS — S46012D Strain of muscle(s) and tendon(s) of the rotator cuff of left shoulder, subsequent encounter: Secondary | ICD-10-CM | POA: Diagnosis not present

## 2015-12-13 DIAGNOSIS — R531 Weakness: Secondary | ICD-10-CM | POA: Diagnosis not present

## 2015-12-22 DIAGNOSIS — M25512 Pain in left shoulder: Secondary | ICD-10-CM | POA: Diagnosis not present

## 2015-12-22 DIAGNOSIS — R531 Weakness: Secondary | ICD-10-CM | POA: Diagnosis not present

## 2015-12-22 DIAGNOSIS — S46012D Strain of muscle(s) and tendon(s) of the rotator cuff of left shoulder, subsequent encounter: Secondary | ICD-10-CM | POA: Diagnosis not present

## 2016-02-14 ENCOUNTER — Ambulatory Visit (INDEPENDENT_AMBULATORY_CARE_PROVIDER_SITE_OTHER): Payer: BLUE CROSS/BLUE SHIELD | Admitting: Physician Assistant

## 2016-02-14 VITALS — BP 152/100 | HR 83 | Temp 98.1°F | Resp 16 | Ht 65.0 in | Wt 236.0 lb

## 2016-02-14 DIAGNOSIS — R030 Elevated blood-pressure reading, without diagnosis of hypertension: Secondary | ICD-10-CM | POA: Diagnosis not present

## 2016-02-14 DIAGNOSIS — R0981 Nasal congestion: Secondary | ICD-10-CM | POA: Diagnosis not present

## 2016-02-14 DIAGNOSIS — J029 Acute pharyngitis, unspecified: Secondary | ICD-10-CM | POA: Diagnosis not present

## 2016-02-14 LAB — POCT CBC
Granulocyte percent: 52.9 %G (ref 37–80)
HCT, POC: 39.4 % (ref 37.7–47.9)
HEMOGLOBIN: 14.3 g/dL (ref 12.2–16.2)
LYMPH, POC: 2.6 (ref 0.6–3.4)
MCH: 31.3 pg — AB (ref 27–31.2)
MCHC: 36.3 g/dL — AB (ref 31.8–35.4)
MCV: 86 fL (ref 80–97)
MID (cbc): 0.5 (ref 0–0.9)
MPV: 6.8 fL (ref 0–99.8)
POC Granulocyte: 3.4 (ref 2–6.9)
POC LYMPH PERCENT: 39.5 %L (ref 10–50)
POC MID %: 7.6 % (ref 0–12)
Platelet Count, POC: 390 10*3/uL (ref 142–424)
RBC: 4.58 M/uL (ref 4.04–5.48)
RDW, POC: 13 %
WBC: 6.5 10*3/uL (ref 4.6–10.2)

## 2016-02-14 LAB — POCT RAPID STREP A (OFFICE): RAPID STREP A SCREEN: NEGATIVE

## 2016-02-14 MED ORDER — FLUTICASONE PROPIONATE 50 MCG/ACT NA SUSP: 2.0000 | Freq: Every day | NASAL | 0 refills | Status: DC

## 2016-02-14 NOTE — Progress Notes (Signed)
MRN: [id number] DOB: Aug 03, 1965  Subjective:   Kayla Moody is a 50 y.o. female [id number] for chief complaint of Sore Throat (x 3 days ) .  Reports 3 day history of sinus congestion, rhinorrhea, sore throat (more painful on left side) and pain with swallowing, chills and malaise. [facility]. Denies fever trismus, voice change, ear fullness, wheezing, shortness of breath, chest pain and cough, night sweats, nausea, vomiting, abdominal pain and diarrhea. Has not had any sick contact with anyone. No history of seasonal allergies or asthma. Patient has not had flu shot this season. Denies smoking or alcohol. Denies any other aggravating or relieving factors, no other questions or concerns.  Kayla Moody has a current medication list which includes the following prescription(s): amlodipine, hydrochlorothiazide, and lisinopril. Also is allergic to penicillins.  Kayla Moody  has a past medical history of Allergy and Hypertension. Also  has a past surgical history that includes Cholecystectomy and Hernia repair.   Objective:   Vitals: BP (!) 162/98 (BP Location: Right Arm, Patient Position: Sitting, Cuff Size: Large)   Pulse 83   Temp 98.1 F (36.7 C) (Oral)   Resp 16   Ht 5\' 5"  (1.651 m)   Wt 236 lb (107 kg)   SpO2 99%   BMI 39.27 kg/m   Physical Exam  Constitutional: She is oriented to person, place, and time. She appears well-developed and well-nourished.  HENT:  Head: Normocephalic and atraumatic.  Right Ear: Tympanic membrane, external ear and ear canal normal.  Left Ear: Tympanic membrane, external ear and ear canal normal.  Nose: Mucosal edema present. Right sinus exhibits no maxillary sinus tenderness and no frontal sinus tenderness. Left sinus exhibits maxillary sinus tenderness. Left sinus exhibits no frontal sinus tenderness.  Mouth/Throat: Uvula is midline and mucous membranes are normal. Posterior oropharyngeal erythema present. No oropharyngeal  exudate.  Eyes: Conjunctivae are normal.  Neck: Trachea normal, normal range of motion and full passive range of motion without pain. No neck rigidity. Normal range of motion present. No thyroid mass present.  Cardiovascular: Normal rate, regular rhythm and normal heart sounds.   Pulmonary/Chest: Effort normal and breath sounds normal.  Lymphadenopathy:       Head (right side): No submental, no submandibular, no tonsillar, no preauricular, no posterior auricular and no occipital adenopathy present.       Head (left side): No submental, no submandibular ( pain with palpation of submandibular lymph node), no tonsillar, no preauricular, no posterior auricular and no occipital adenopathy present.    She has no cervical adenopathy.       Right: No supraclavicular adenopathy present.       Left: No supraclavicular adenopathy present.  Neurological: She is alert and oriented to person, place, and time.  Skin: Skin is warm and dry.  Psychiatric: She has a normal mood and affect.  Vitals reviewed.   Results for orders placed or performed in visit on 02/14/16 (from the past 24 hour(s))  POCT CBC     Status: Abnormal   Collection Time: 02/14/16 11:17 AM  Result Value Ref Range   WBC 6.5 4.6 - 10.2 K/uL   Lymph, poc 2.6 0.6 - 3.4   POC LYMPH PERCENT 39.5 10 - 50 %L   MID (cbc) 0.5 0 - 0.9   POC MID % 7.6 0 - 12 %M   POC Granulocyte 3.4 2 - 6.9   Granulocyte percent 52.9 37 - 80 %G   RBC 4.58 4.04 - 5.48  M/uL   Hemoglobin 14.3 12.2 - 16.2 g/dL   HCT, POC 40.939.4 81.137.7 - 47.9 %   MCV 86.0 80 - 97 fL   MCH, POC 31.3 (A) 27 - 31.2 pg   MCHC 36.3 (A) 31.8 - 35.4 g/dL   RDW, POC 91.413.0 %   Platelet Count, POC 390 142 - 424 K/uL   MPV 6.8 0 - 99.8 fL  POCT rapid strep A     Status: None   Collection Time: 02/14/16 11:22 AM  Result Value Ref Range   Rapid Strep A Screen Negative Negative    Assessment and Plan :  1. Elevated blood pressure reading -Pt instructed to check bp while outside of  office. If bp remains >140/90, instructed she can take lisinopril 20mg . Encouraged her to follow up in office in 2 weeks for reevaluation of bp.   2. Sore throat -Await results  - POCT CBC - POCT rapid strep A - Culture, Group A Strep  3. Nasal congestion - fluticasone (FLONASE) 50 MCG/ACT nasal spray; Place 2 sprays into both nostrils daily.  Dispense: 16 g; Refill: 0 -Return to clinic if symptoms worsen, do not improve, or as needed   Benjiman CoreBrittany Ottilia Pippenger, PA-C  [facility] 02/14/2016 11:29 AM

## 2016-02-14 NOTE — Patient Instructions (Addendum)
-   We will treat this as a respiratory viral infection.  - I recommend you rest, drink plenty of fluids, eat light meals including soups.  - You may also use Tylenol or ibuprofen over-the-counter for your sore throat.  -You can use OTC mucinex and prescription flonase for your nasal congestion.  - Please let me know if you are not seeing any improvement or get worse in 7 days. If you develop worsening pain, fever, difficulty swallowing, or trouble breathing seek care immediately.   I would like you to keep monitoring your bp outside of office, it is is consistently over 140/90, take 20mg  of lisinopril. Follow up a week after you are taking this dose so we can monitor it here.    IF you received an x-ray today, you will receive an invoice from [facility]. Please contact [facility] at [phone] with questions or concerns regarding your invoice.   IF you received labwork today, you will receive an invoice from [facility]. Please contact [facility] at [phone] with questions or concerns regarding your invoice.   Our billing staff will not be able to assist you with questions regarding bills from these companies.  You will be contacted with the lab results as soon as they are available. The fastest way to get your results is to activate your My Chart account. Instructions are located on the last page of this paperwork. If you have not heard from us regarding the results in 2 weeks, please contact this office.

## 2016-02-17 LAB — CULTURE, GROUP A STREP: Strep A Culture: NEGATIVE

## 2016-03-14 ENCOUNTER — Ambulatory Visit (INDEPENDENT_AMBULATORY_CARE_PROVIDER_SITE_OTHER): Payer: BLUE CROSS/BLUE SHIELD | Admitting: Family Medicine

## 2016-03-14 DIAGNOSIS — I10 Essential (primary) hypertension: Secondary | ICD-10-CM | POA: Diagnosis not present

## 2016-03-14 MED ORDER — OMEPRAZOLE 40 MG PO CPDR: 40.0000 mg | DELAYED_RELEASE_CAPSULE | Freq: Two times a day (BID) | ORAL | 3 refills | Status: DC

## 2016-03-14 MED ORDER — HYDROCHLOROTHIAZIDE 12.5 MG PO CAPS: 25.0000 mg | ORAL_CAPSULE | Freq: Every day | ORAL | 3 refills | Status: DC

## 2016-03-14 NOTE — Progress Notes (Signed)
Patient ID: Kayla Moody, female    DOB: 10-Apr-1965, 51 y.o.   MRN: [id number]  PCP: No primary care provider on file.  Chief Complaint  Patient presents with  . Follow-up    blood pressure  . Gastroesophageal Reflux    getting worse, especially at night     Subjective:  HPI 51 year old presents for evaluation of hypertension and worsening GERD. Pt recently relocated to [facility] from [facility] and has been seen in clinic on one prior occasion. During that visit her blood pressure was elevated 151/100. She routinely monitors her pressures and reports that BP are consistently elevated. During her visit on 02/14/16, she was advised to increase lisinopril to 20 mg once daily and continue Amlodipine and hydrochlorothiazide daily. Pt reports that her blood pressure continues to be elevated in spite of increasing lisinopril to 20 mg daily. She reports fatigue and occasional headache. Denies chest pain or shortness of breath.   Social History   Social History  . Marital status: Married    Spouse name: N/A  . Number of children: N/A  . Years of education: N/A   Occupational History  . Not on file.   Social History Main Topics  . Smoking status: Never Smoker  . Smokeless tobacco: Never Used  . Alcohol use No  . Drug use: No  . Sexual activity: Not on file   Other Topics Concern  . Not on file   Social History Narrative  . No narrative on file   Review of Systems See HPI There are no active problems to display for this patient.   Allergies  Allergen Reactions  . Penicillins     Prior to Admission medications   Medication Sig Start Date End Date Taking? Authorizing Provider  amLODipine (NORVASC) 10 MG tablet Take 1 tablet (10 mg total) by mouth daily. 06/08/15  Yes Robyn Haber, MD  hydrochlorothiazide (MICROZIDE) 12.5 MG capsule Take 1 capsule (12.5 mg total) by mouth daily. 06/08/15  Yes Robyn Haber, MD  lisinopril (PRINIVIL,ZESTRIL) 10 MG tablet Take 1 tablet (10 mg  total) by mouth daily. 06/08/15  Yes Robyn Haber, MD  fluticasone (FLONASE) 50 MCG/ACT nasal spray Place 2 sprays into both nostrils daily. Patient not taking: Reported on 03/14/2016 02/14/16   Leonie Douglas, PA-C    Past Medical, Surgical Family and Social History reviewed and updated.    Objective:   Today's Vitals   03/14/16 1340  BP: (!) 138/96  Pulse: 74  Resp: 16  Temp: 98.1 F (36.7 C)  TempSrc: Oral  SpO2: 100%  Weight: 238 lb 9.6 oz (108.2 kg)  Height: '5\' 5"'  (1.651 m)    Wt Readings from Last 3 Encounters:  03/14/16 238 lb 9.6 oz (108.2 kg)  02/14/16 236 lb (107 kg)  06/08/15 236 lb (107 kg)    Physical Exam  Constitutional: She is oriented to person, place, and time. She appears well-developed and well-nourished.  HENT:  Head: Normocephalic and atraumatic.  Right Ear: External ear normal.  Left Ear: External ear normal.  Nose: Nose normal.  Mouth/Throat: Oropharynx is clear and moist.  Eyes: Conjunctivae are normal. Pupils are equal, round, and reactive to light.  Neck: Normal range of motion. Neck supple.  Cardiovascular: Normal rate, regular rhythm, normal heart sounds and intact distal pulses.   No murmur heard. Pulmonary/Chest: Effort normal and breath sounds normal.  Musculoskeletal: Normal range of motion.  Neurological: She is alert and oriented to person, place, and time.  Skin: Skin is warm and dry.  Psychiatric: She has a normal mood and affect. Her behavior is normal. Judgment and thought content normal.     Assessment & Plan:  1. Essential hypertension, rem - Increased hydrochlorothiazide (MICROZIDE) 25  MG capsule - CMP14+EGFR - Thyroid Panel With TSH  Pt had to abruptly leave after physical exam and could not wait for labs. Pt has to return to tomorrow for FT labs  [employer]. Kenton Kingfisher, MSN, FNP-C [facility]

## 2016-03-14 NOTE — Progress Notes (Deleted)
    MRN: [id number] DOB: 1965/10/17  Subjective:   Kayla Moody is a 51 y.o. female presenting for chief complaint of Follow-up (blood pressure) and Gastroesophageal Reflux (getting worse, especially at night ) .  Reports *** history of {URI Symptoms :210800001}, {Systemic Symptoms:430 211 2637}. Has tried *** relief. Denies ***fever, {URI Symptoms :210800001}, {Systemic Symptoms:430 211 2637}. Has *** had *** sick contact with ***. *** history of seasonal allergies, history of asthma. Patient *** flu shot this season. *** smoking, *** alcohol. Denies any other aggravating or relieving factors, no other questions or concerns.  Kayla Moody has a current medication list which includes the following prescription(s): amlodipine, hydrochlorothiazide, lisinopril, and fluticasone. Also is allergic to penicillins.  Kayla Moody  has a past medical history of Allergy and Hypertension. Also  has a past surgical history that includes Cholecystectomy and Hernia repair.   Objective:   Vitals: BP (!) 138/96 (BP Location: Right Arm, Patient Position: Sitting, Cuff Size: Large)   Pulse 74   Temp 98.1 F (36.7 C) (Oral)   Resp 16   Ht 5\' 5"  (1.651 m)   Wt 238 lb 9.6 oz (108.2 kg)   SpO2 100%   BMI 39.71 kg/m   Physical Exam  No results found for this or any previous visit (from the past 24 hour(s)).  Assessment and Plan :  There are no diagnoses linked to this encounter.  Kayla CoreBrittany Lindia Garms, PA-C  [facility] 03/14/2016 1:47 PM

## 2016-03-14 NOTE — Patient Instructions (Signed)
     IF you received an x-ray today, you will receive an invoice from [facility]. Please contact [facility] at [phone] with questions or concerns regarding your invoice.   IF you received labwork today, you will receive an invoice from [facility]. Please contact [facility] at [phone] with questions or concerns regarding your invoice.   Our billing staff will not be able to assist you with questions regarding bills from these companies.  You will be contacted with the lab results as soon as they are available. The fastest way to get your results is to activate your My Chart account. Instructions are located on the last page of this paperwork. If you have not heard from us regarding the results in 2 weeks, please contact this office.     

## 2016-06-15 ENCOUNTER — Ambulatory Visit (INDEPENDENT_AMBULATORY_CARE_PROVIDER_SITE_OTHER): Payer: BLUE CROSS/BLUE SHIELD

## 2016-06-15 ENCOUNTER — Ambulatory Visit (INDEPENDENT_AMBULATORY_CARE_PROVIDER_SITE_OTHER): Payer: BLUE CROSS/BLUE SHIELD | Admitting: Physician Assistant

## 2016-06-15 VITALS — BP 166/93 | HR 83 | Temp 98.2°F | Resp 18 | Ht 65.0 in | Wt 239.0 lb

## 2016-06-15 DIAGNOSIS — M25562 Pain in left knee: Secondary | ICD-10-CM

## 2016-06-15 DIAGNOSIS — G8929 Other chronic pain: Secondary | ICD-10-CM

## 2016-06-15 DIAGNOSIS — M25561 Pain in right knee: Secondary | ICD-10-CM

## 2016-06-15 DIAGNOSIS — M1711 Unilateral primary osteoarthritis, right knee: Secondary | ICD-10-CM | POA: Diagnosis not present

## 2016-06-15 DIAGNOSIS — M1712 Unilateral primary osteoarthritis, left knee: Secondary | ICD-10-CM | POA: Diagnosis not present

## 2016-06-15 DIAGNOSIS — R030 Elevated blood-pressure reading, without diagnosis of hypertension: Secondary | ICD-10-CM

## 2016-06-15 DIAGNOSIS — M170 Bilateral primary osteoarthritis of knee: Secondary | ICD-10-CM | POA: Diagnosis not present

## 2016-06-15 MED ORDER — DICLOFENAC SODIUM 1 % TD GEL: 2.0000 g | Freq: Four times a day (QID) | TRANSDERMAL | 0 refills | Status: DC

## 2016-06-15 MED ORDER — MELOXICAM 7.5 MG PO TABS: 7.5000 mg | ORAL_TABLET | Freq: Every day | ORAL | 0 refills | Status: DC

## 2016-06-15 NOTE — Progress Notes (Signed)
Rosa Wyly  MRN: [id number] DOB: May 05, 1965  Subjective:  Landy Mace is a 51 y.o. female seen in office today for a chief complaint of right knee pain x 1 month and left knee pain x 2 days. Has associated stiffness and swelling. Denies acute injury, redness, warmth, numbness, tingling, and weakness. Pain is worsened with walking, standing, and going up stairs. Pain is worse first thing in the morning and gets better throughout the day. Pain is made better by sitting and rubbing the knees. Has tried MSM powder, daily ibuprofen, and ice with moderate relief.  She works at [employer] in [employer] and stands on her feet all day. She has been in retail in 32 years. No hx of arthritis. Denies smoking. Just started engaging in exercise a couple of weeks ago in hopes that this would help.   Review of Systems  Constitutional: Negative for chills, diaphoresis and fever.  Eyes: Negative for visual disturbance.  Respiratory: Negative for shortness of breath.   Cardiovascular: Negative for chest pain and palpitations.  Gastrointestinal: Negative for abdominal pain, nausea and vomiting.  Neurological: Negative for dizziness and headaches.    There are no active problems to display for this patient.   Current Outpatient Prescriptions on File Prior to Visit  Medication Sig Dispense Refill  . amLODipine (NORVASC) 10 MG tablet Take 1 tablet (10 mg total) by mouth daily. 90 tablet 3  . hydrochlorothiazide (MICROZIDE) 12.5 MG capsule Take 2 capsules (25 mg total) by mouth daily. 90 capsule 3  . lisinopril (PRINIVIL,ZESTRIL) 10 MG tablet Take 1 tablet (10 mg total) by mouth daily. 90 tablet 3  . omeprazole (PRILOSEC) 40 MG capsule Take 1 capsule (40 mg total) by mouth 2 (two) times daily. 90 capsule 3  . fluticasone (FLONASE) 50 MCG/ACT nasal spray Place 2 sprays into both nostrils daily. (Patient not taking: Reported on 03/14/2016) 16 g 0   No current facility-administered medications on file prior  to visit.     Allergies  Allergen Reactions  . Penicillins      Objective:  BP (!) 166/93   Pulse 83   Temp 98.2 F (36.8 C) (Oral)   Resp 18   Ht  (1.651 m)   Wt 239 lb (108.4 kg)   SpO2 97%   BMI 39.77 kg/m   Physical Exam  Constitutional: She is oriented to person, place, and time and well-developed, well-nourished, and in no distress.  HENT:  Head: Normocephalic and atraumatic.  Eyes: Conjunctivae are normal.  Neck: Normal range of motion.  Pulmonary/Chest: Effort normal.  Musculoskeletal:       Right knee: She exhibits swelling (mild). She exhibits normal range of motion, no erythema, no LCL laxity, normal meniscus and no MCL laxity. Tenderness found. Medial joint line tenderness noted. No lateral joint line, no MCL, no LCL and no patellar tendon tenderness noted.       Left knee: She exhibits swelling (mild ). She exhibits normal range of motion, no erythema, no LCL laxity, normal meniscus and no MCL laxity. Tenderness found. Medial joint line and patellar tendon tenderness noted. No lateral joint line, no MCL and no LCL tenderness noted.  Negative McMurrays test Negative Anterior and Posterior Drawer Tests.  Crepitus palpated in knees bilaterally.  Neurological: She is alert and oriented to person, place, and time. She has normal sensation and normal strength.  Reflex Scores:      Patellar reflexes are 2+ on the right side and 2+ on the left  side.      Achilles reflexes are 2+ on the right side and 2+ on the left side. Skin: Skin is warm and dry.  Psychiatric: Affect normal.  Vitals reviewed.  Dg Knee Complete 4 Views Left  Result Date: 06/15/2016 CLINICAL DATA:  Left knee pain for 2 days. EXAM: LEFT KNEE - COMPLETE 4+ VIEW COMPARISON:  None. FINDINGS: No evidence of fracture, dislocation, or joint effusion. Three compartment osteoarthritic changes with joint space narrowing, subchondral sclerosis and osteophyte formation, moderate. IMPRESSION: Three compartment  osteoarthritic changes of the left knee, moderate. Electronically Signed   By: Ted Mcalpine M.D.   On: 06/15/2016 17:42   Dg Knee Complete 4 Views Right  Result Date: 06/15/2016 CLINICAL DATA:  Left knee pain for 2 days. EXAM: RIGHT KNEE - COMPLETE 4+ VIEW COMPARISON:  None. FINDINGS: No evidence of fracture, or dislocation. There is a suprapatellar joint effusion. Minimal osteoarthritic changes of the right knee. Superior patellar enthesophyte is noted. IMPRESSION: Suprapatellar joint effusion. Minimal osteoarthritic changes of the right knee. Electronically Signed   By: Ted Mcalpine M.D.   On: 06/15/2016 17:44    Assessment and Plan :  1. Pain in both knees, unspecified chronicity - DG Knee Complete 4 Views Left; Future - DG Knee Complete 4 Views Right; Future  2. Osteoarthritis of both knees, unspecified osteoarthritis type Pt given educational material on OA. Instructed to try voltaren gel tonight and if she responds well can use this daily. If no response, use meloxicam for 2 week period. Informed to not take meloxicam and use voltaren gel in the same 24 hour period. Pt will also likely benefit from orthopedic referral due to the involvement of OA especially in the left knee.  - meloxicam (MOBIC) 7.5 MG tablet; Take 1 tablet (7.5 mg total) by mouth daily.  Dispense: 30 tablet; Refill: 0 - diclofenac sodium (VOLTAREN) 1 % GEL; Apply 2 g topically 4 (four) times daily.  Dispense: 100 g; Refill: 0 - Ambulatory referral to Orthopedic Surgery  3. Elevated blood pressure reading Asymptomatic today, instructed to check bp outside of office and if it is consistently >140/90, increase lisinopril dose to  a day along with your other medications. Follow up in a couple of weeks. Given strict ED precautions.   Benjiman Core PA-C  [facility] 06/15/2016 5:51 PM

## 2016-06-15 NOTE — Patient Instructions (Addendum)
If your bp is consistently >140/90, try increasing lisinopril dose to  a day along with your other medications.    For knee pain, it appears that you are having osteoarthritis. It is worse in your left knee.This is a chronic disorder and can often times be hard to manage. Therefore, I would like to refer you to ortho for further evaluation. In the meantime, you can  take meloxicam daily for the next two weeks. I will also give you a topical gel you can try. Please do not use the topical gel and oral meloxicam in the same day. If one works better than the other, then use that one. You can also use compression wrap on the knee when it has swelling in it for relief.   Please let me know if you have any other questions. Thank you for letting me participate in your health and well being.   Arthritis Arthritis means joint pain. It can also mean joint disease. A joint is a place where bones come together. People who have arthritis may have:  Red joints.  Swollen joints.  Stiff joints.  Warm joints.  A fever.  A feeling of being sick. Follow these instructions at home: Pay attention to any changes in your symptoms. Take these actions to help with your pain and swelling. Medicines   Take over-the-counter and prescription medicines only as told by your doctor.  Do not take aspirin for pain if your doctor says that you may have gout. Activity   Rest your joint if your doctor tells you to.  Avoid activities that make the pain worse.  Exercise your joint regularly as told by your doctor. Try doing exercises like:  Swimming.  Water aerobics.  Biking.  Walking. Joint Care    If your joint is swollen, keep it raised (elevated) if told by your doctor.  If your joint feels stiff in the morning, try taking a warm shower.  If you have diabetes, do not apply heat without asking your doctor.  If told, apply heat to the joint:  Put a towel between the joint and the hot pack or  heating pad.  Leave the heat on the area for 20-30 minutes.  If told, apply ice to the joint:  Put ice in a plastic bag.  Place a towel between your skin and the bag.  Leave the ice on for 20 minutes, 2-3 times per day.  Keep all follow-up visits as told by your doctor. Contact a doctor if:  The pain gets worse.  You have a fever. Get help right away if:  You have very bad pain in your joint.  You have swelling in your joint.  Your joint is red.  Many joints become painful and swollen.  You have very bad back pain.  Your leg is very weak.  You cannot control your pee (urine) or poop (stool). This information is not intended to replace advice given to you by your health care provider. Make sure you discuss any questions you have with your health care provider. Document Released: 05/23/2009 Document Revised: 08/04/2015 Document Reviewed: 05/24/2014 Elsevier Interactive Patient Education  2017 [employer].      IF you received an x-ray today, you will receive an invoice from [facility]. Please contact [facility] at [phone] with questions or concerns regarding your invoice.   IF you received labwork today, you will receive an invoice from [place]. Please contact [facility] at [phone] with questions or concerns regarding your invoice.  Our billing staff will not be able to assist you with questions regarding bills from these companies.  You will be contacted with the lab results as soon as they are available. The fastest way to get your results is to activate your My Chart account. Instructions are located on the last page of this paperwork. If you have not heard from [place] regarding the results in 2 weeks, please contact this office.

## 2016-06-16 ENCOUNTER — Telehealth: Payer: Self-pay

## 2016-06-16 NOTE — Telephone Encounter (Signed)
Diclofenac sodium needs PA  [id number]

## 2016-06-28 ENCOUNTER — Ambulatory Visit (INDEPENDENT_AMBULATORY_CARE_PROVIDER_SITE_OTHER): Payer: BLUE CROSS/BLUE SHIELD | Admitting: Emergency Medicine

## 2016-06-28 VITALS — BP 135/86 | HR 87 | Temp 98.3°F | Resp 18 | Ht 65.0 in | Wt 235.0 lb

## 2016-06-28 DIAGNOSIS — R011 Cardiac murmur, unspecified: Secondary | ICD-10-CM

## 2016-06-28 DIAGNOSIS — I10 Essential (primary) hypertension: Secondary | ICD-10-CM

## 2016-06-28 DIAGNOSIS — R21 Rash and other nonspecific skin eruption: Secondary | ICD-10-CM

## 2016-06-28 HISTORY — DX: Essential (primary) hypertension: I10

## 2016-06-28 MED ORDER — LISINOPRIL 20 MG PO TABS: 20.0000 mg | ORAL_TABLET | Freq: Every day | ORAL | 3 refills | Status: DC

## 2016-06-28 MED ORDER — HYDROCHLOROTHIAZIDE 25 MG PO TABS: 25.0000 mg | ORAL_TABLET | Freq: Every day | ORAL | 5 refills | Status: DC

## 2016-06-28 NOTE — Patient Instructions (Addendum)
   IF you received an x-ray today, you will receive an invoice from [facility]. Please contact  Radiology at [phone] with questions or concerns regarding your invoice.   IF you received labwork today, you will receive an invoice from [facility]. Please contact [facility] at [phone] with questions or concerns regarding your invoice.   Our billing staff will not be able to assist you with questions regarding bills from these companies.  You will be contacted with the lab results as soon as they are available. The fastest way to get your results is to activate your My Chart account. Instructions are located on the last page of this paperwork. If you have not heard from us regarding the results in 2 weeks, please contact this office.      Hypertension Hypertension is another name for high blood pressure. High blood pressure forces your heart to work harder to pump blood. This can cause problems over time. There are two numbers in a blood pressure reading. There is a top number (systolic) over a bottom number (diastolic). It is best to have a blood pressure below 120/80. Healthy choices can help lower your blood pressure. You may need medicine to help lower your blood pressure if:  Your blood pressure cannot be lowered with healthy choices.  Your blood pressure is higher than 130/80. Follow these instructions at home: Eating and drinking   If directed, follow the DASH eating plan. This diet includes:  Filling half of your plate at each meal with fruits and vegetables.  Filling one quarter of your plate at each meal with whole grains. Whole grains include whole wheat pasta, brown rice, and whole grain bread.  Eating or drinking low-fat dairy products, such as skim milk or low-fat yogurt.  Filling one quarter of your plate at each meal with low-fat (lean) proteins. Low-fat proteins include fish, skinless chicken, eggs, beans, and tofu.  Avoiding fatty meat,  cured and processed meat, or chicken with skin.  Avoiding premade or processed food.  Eat less than 1,500 mg of salt (sodium) a day.  Limit alcohol use to no more than 1 drink a day for nonpregnant women and 2 drinks a day for men. One drink equals 12 oz of beer, 5 oz of wine, or 1 oz of hard liquor. Lifestyle   Work with your doctor to stay at a healthy weight or to lose weight. Ask your doctor what the best weight is for you.  Get at least 30 minutes of exercise that causes your heart to beat faster (aerobic exercise) most days of the week. This may include walking, swimming, or biking.  Get at least 30 minutes of exercise that strengthens your muscles (resistance exercise) at least 3 days a week. This may include lifting weights or pilates.  Do not use any products that contain nicotine or tobacco. This includes cigarettes and e-cigarettes. If you need help quitting, ask your doctor.  Check your blood pressure at home as told by your doctor.  Keep all follow-up visits as told by your doctor. This is important. Medicines   Take over-the-counter and prescription medicines only as told by your doctor. Follow directions carefully.  Do not skip doses of blood pressure medicine. The medicine does not work as well if you skip doses. Skipping doses also puts you at risk for problems.  Ask your doctor about side effects or reactions to medicines that you should watch for. Contact a doctor if:  You think you are having a   reaction to the medicine you are taking.  You have headaches that keep coming back (recurring).  You feel dizzy.  You have swelling in your ankles.  You have trouble with your vision. Get help right away if:  You get a very bad headache.  You start to feel confused.  You feel weak or numb.  You feel faint.  You get very bad pain in your:  Chest.  Belly (abdomen).  You throw up (vomit) more than once.  You have trouble  breathing. Summary  Hypertension is another name for high blood pressure.  Making healthy choices can help lower blood pressure. If your blood pressure cannot be controlled with healthy choices, you may need to take medicine. This information is not intended to replace advice given to you by your health care provider. Make sure you discuss any questions you have with your health care provider. Document Released: 08/15/2007 Document Revised: 01/25/2016 Document Reviewed: 01/25/2016 Elsevier Interactive Patient Education  2017 Elsevier Inc.  

## 2016-06-28 NOTE — Progress Notes (Signed)
Kayla Moody 51 y.o.   Chief Complaint  Patient presents with  . Hypertension    pt states her BP cuff at home is running high     HISTORY OF PRESENT ILLNESS: [name] complaining of elevated BP readings at home the past several weeks; asymptomatic.  HPI   Prior to Admission medications   Medication Sig Start Date End Date Taking? Authorizing Provider  amLODipine (NORVASC) 10 MG tablet Take 1 tablet (10 mg total) by mouth daily. 06/08/15  Yes Elvina Sidle, MD  hydrochlorothiazide (MICROZIDE) 12.5 MG capsule Take 2 capsules (25 mg total) by mouth daily. 03/14/16  Yes Doyle Askew, FNP  lisinopril (PRINIVIL,ZESTRIL) 10 MG tablet Take 1 tablet (10 mg total) by mouth daily. 06/08/15  Yes Elvina Sidle, MD  meloxicam (MOBIC) 7.5 MG tablet Take 1 tablet (7.5 mg total) by mouth daily. 06/15/16  Yes Magdalene River, PA-C  omeprazole (PRILOSEC) 40 MG capsule Take 1 capsule (40 mg total) by mouth 2 (two) times daily. 03/14/16  Yes Doyle Askew, FNP  diclofenac sodium (VOLTAREN) 1 % GEL Apply 2 g topically 4 (four) times daily. Patient not taking: Reported on 06/28/2016 06/15/16   Magdalene River, PA-C  fluticasone [facility]) 50 MCG/ACT nasal spray Place 2 sprays into both nostrils daily. Patient not taking: Reported on 03/14/2016 02/14/16   Magdalene River, PA-C    Allergies  Allergen Reactions  . Penicillins     There are no active problems to display for this patient.   Past Medical History:  Diagnosis Date  . Allergy   . Hypertension     Past Surgical History:  Procedure Laterality Date  . CHOLECYSTECTOMY    . HERNIA REPAIR      Social History   Social History  . Marital status: Married    Spouse name: N/A  . Number of children: N/A  . Years of education: N/A   Occupational History  . Not on file.   Social History Main Topics  . Smoking status: Never Smoker  . Smokeless tobacco: Never Used  . Alcohol use No  . Drug  use: No  . Sexual activity: Not on file   Other Topics Concern  . Not on file   Social History Narrative  . No narrative on file    No family history on file.   Review of Systems  Constitutional: Negative.  Negative for chills and fever.  HENT: Negative.  Negative for nosebleeds and sore throat.   Eyes: Negative.  Negative for discharge and redness.  Respiratory: Negative.  Negative for cough and wheezing.   Cardiovascular: Negative.  Negative for chest pain, orthopnea, claudication and leg swelling.  Gastrointestinal: Negative.  Negative for abdominal pain, diarrhea, nausea and vomiting.  Genitourinary: Negative.  Negative for dysuria and hematuria.  Musculoskeletal: Negative.  Negative for back pain and neck pain.  Skin: Positive for rash (facial).  Neurological: Negative for headaches.  Endo/Heme/Allergies: Negative.   All other systems reviewed and are negative.  Vitals:   06/28/16 0816  BP: 135/86  Pulse: 87  Resp: 18  Temp: 98.3 F (36.8 C)     Physical Exam  Constitutional: She is oriented to person, place, and time. She appears well-developed and well-nourished.  HENT:  Head: Normocephalic and atraumatic.  Mouth/Throat: Oropharynx is clear and moist. No oropharyngeal exudate.  Eyes: Conjunctivae and EOM are normal. Pupils are equal, round, and reactive to light.  Neck: Normal range of motion. Neck supple.  No JVD present. No thyromegaly present.  Cardiovascular: Normal rate, regular rhythm and intact distal pulses.   Murmur (systolic 2/6 aortic area) heard. Pulmonary/Chest: Effort normal and breath sounds normal.  Abdominal: Soft. Bowel sounds are normal. There is no tenderness.  Musculoskeletal: Normal range of motion.  Lymphadenopathy:    She has no cervical adenopathy.  Neurological: She is alert and oriented to person, place, and time. She displays normal reflexes. No sensory deficit. She exhibits normal muscle tone.  Skin: Skin is warm and dry.  Capillary refill takes less than 2 seconds. Rash (depigmented areas on face) noted.  Psychiatric: She has a normal mood and affect. Her behavior is normal.  Vitals reviewed.    ASSESSMENT & PLAN: Kayla Moody was seen today for hypertension.  Diagnoses and all orders for this visit:  Essential hypertension  Facial rash -     Ambulatory referral to Dermatology  Heart murmur -     Ambulatory referral to Cardiology  Other orders -     hydrochlorothiazide (HYDRODIURIL) 25 MG tablet; Take 1 tablet (25 mg total) by mouth daily. -     lisinopril (PRINIVIL,ZESTRIL) 20 MG tablet; Take 1 tablet (20 mg total) by mouth daily.    Patient Instructions       IF you received an x-ray today, you will receive an invoice from [facility]. Please contact [facility] at [phone] with questions or concerns regarding your invoice.   IF you received labwork today, you will receive an invoice from [place]. Please contact [facility] at [phone] with questions or concerns regarding your invoice.   Our billing staff will not be able to assist you with questions regarding bills from these companies.  You will be contacted with the lab results as soon as they are available. The fastest way to get your results is to activate your My Chart account. Instructions are located on the last page of this paperwork. If you have not heard from [place] regarding the results in 2 weeks, please contact this office.      Hypertension Hypertension is another name for high blood pressure. High blood pressure forces your heart to work harder to pump blood. This can cause problems over time. There are two numbers in a blood pressure reading. There is a top number (systolic) over a bottom number (diastolic). It is best to have a blood pressure below 120/80. Healthy choices can help lower your blood pressure. You may need medicine to help lower your blood pressure if:  Your blood pressure cannot be  lowered with healthy choices.  Your blood pressure is higher than 130/80. Follow these instructions at home: Eating and drinking   If directed, follow the DASH eating plan. This diet includes:  Filling half of your plate at each meal with fruits and vegetables.  Filling one quarter of your plate at each meal with whole grains. Whole grains include whole wheat pasta, brown rice, and whole grain bread.  Eating or drinking low-fat dairy products, such as skim milk or low-fat yogurt.  Filling one quarter of your plate at each meal with low-fat (lean) proteins. Low-fat proteins include fish, skinless chicken, eggs, beans, and tofu.  Avoiding fatty meat, cured and processed meat, or chicken with skin.  Avoiding premade or processed food.  Eat less than 1,500 mg of salt (sodium) a day.  Limit alcohol use to no more than 1 drink a day for nonpregnant women and 2 drinks a day for men. One drink equals 12 oz of beer,  5 oz of wine, or 1 oz of hard liquor. Lifestyle   Work with your doctor to stay at a healthy weight or to lose weight. Ask your doctor what the best weight is for you.  Get at least 30 minutes of exercise that causes your heart to beat faster (aerobic exercise) most days of the week. This may include walking, swimming, or biking.  Get at least 30 minutes of exercise that strengthens your muscles (resistance exercise) at least 3 days a week. This may include lifting weights or pilates.  Do not use any products that contain nicotine or tobacco. This includes cigarettes and e-cigarettes. If you need help quitting, ask your doctor.  Check your blood pressure at home as told by your doctor.  Keep all follow-up visits as told by your doctor. This is important. Medicines   Take over-the-counter and prescription medicines only as told by your doctor. Follow directions carefully.  Do not skip doses of blood pressure medicine. The medicine does not work as well if you skip doses.  Skipping doses also puts you at risk for problems.  Ask your doctor about side effects or reactions to medicines that you should watch for. Contact a doctor if:  You think you are having a reaction to the medicine you are taking.  You have headaches that keep coming back (recurring).  You feel dizzy.  You have swelling in your ankles.  You have trouble with your vision. Get help right away if:  You get a very bad headache.  You start to feel confused.  You feel weak or numb.  You feel faint.  You get very bad pain in your:  Chest.  Belly (abdomen).  You throw up (vomit) more than once.  You have trouble breathing. Summary  Hypertension is another name for high blood pressure.  Making healthy choices can help lower blood pressure. If your blood pressure cannot be controlled with healthy choices, you may need to take medicine. This information is not intended to replace advice given to you by your health care provider. Make sure you discuss any questions you have with your health care provider. Document Released: 08/15/2007 Document Revised: 01/25/2016 Document Reviewed: 01/25/2016 Elsevier Interactive Patient Education  2017 Elsevier Inc.      Edwina Barth, MD [facility]

## 2016-06-28 NOTE — Telephone Encounter (Signed)
Entered in cover my meds. Response waiting.

## 2016-07-02 ENCOUNTER — Telehealth: Payer: Self-pay

## 2016-07-02 NOTE — Telephone Encounter (Signed)
Request for patient to have  Diclofenac Sodium gel was denied on 06/28/2016. Patient notified. Advised patient to CB if she would like to try something else.

## 2016-07-03 ENCOUNTER — Ambulatory Visit (INDEPENDENT_AMBULATORY_CARE_PROVIDER_SITE_OTHER): Payer: BLUE CROSS/BLUE SHIELD | Admitting: Orthopaedic Surgery

## 2016-07-03 ENCOUNTER — Encounter (INDEPENDENT_AMBULATORY_CARE_PROVIDER_SITE_OTHER): Payer: Self-pay | Admitting: Orthopaedic Surgery

## 2016-07-03 ENCOUNTER — Telehealth (INDEPENDENT_AMBULATORY_CARE_PROVIDER_SITE_OTHER): Payer: Self-pay | Admitting: Orthopaedic Surgery

## 2016-07-03 DIAGNOSIS — M1711 Unilateral primary osteoarthritis, right knee: Secondary | ICD-10-CM | POA: Diagnosis not present

## 2016-07-03 DIAGNOSIS — M1712 Unilateral primary osteoarthritis, left knee: Secondary | ICD-10-CM | POA: Diagnosis not present

## 2016-07-03 MED ORDER — DICLOFENAC SODIUM 75 MG PO TBEC: 75.0000 mg | DELAYED_RELEASE_TABLET | Freq: Two times a day (BID) | ORAL | 2 refills | Status: DC

## 2016-07-03 NOTE — Telephone Encounter (Signed)
I have completed the form and placed it in the "to be faxed" box in the provider's lounge. Could you please call the patient and let her know we also have some knee braces that would be good for her to wear if she wants to return for a visit with me? Thanks!

## 2016-07-03 NOTE — Telephone Encounter (Signed)
Please advise 

## 2016-07-03 NOTE — Progress Notes (Signed)
Office Visit Note   Patient: Kayla Moody           Date of Birth: 11/17/1965           MRN: [id number] Visit Date: 07/03/2016              Requested by: Magdalene River, PA-C [address] [place], [place] [address] PCP: No PCP Per Patient   Assessment & Plan: Visit Diagnoses: No diagnosis found.  Plan: Patient has moderate osteoarthritis worse in left knee. Work note given today. Diclofenac prescribed to see if this will work better than meloxicam. Patient will think about if she wants a cortisone injection or not. Also discussed home exercise and weight loss. Follow-up with me as needed.  Follow-Up Instructions: Return if symptoms worsen or fail to improve.   Orders:  No orders of the defined types were placed in this encounter.  Meds ordered this encounter  Medications  . diclofenac (VOLTAREN) 75 MG EC tablet    Sig: Take 1 tablet (75 mg total) by mouth 2 (two) times daily.    Dispense:  30 tablet    Refill:  2      Procedures: No procedures performed   Clinical Data: No additional findings.   Subjective: Chief Complaint  Patient presents with  . Left Knee - Pain  . Right Knee - Pain    Kayla Moody is a very pleasant 51 year old female comes in with bilateral knee pain worse on the left. This been going on for months. The pain is worse in the morning and worse with standing. The pain does not radiate. Denies any mechanical symptoms or swelling. Denies any injuries. Then knee is a constant throbbing pain. Denies any constitutional symptoms.    Review of Systems  Constitutional: Negative.   HENT: Negative.   Eyes: Negative.   Respiratory: Negative.   Cardiovascular: Negative.   Endocrine: Negative.   Musculoskeletal: Negative.   Neurological: Negative.   Hematological: Negative.   Psychiatric/Behavioral: Negative.   All other systems reviewed and are negative.    Objective: Vital Signs: There were no vitals taken for this visit.  Physical Exam    Constitutional: She is oriented to person, place, and time. She appears well-developed and well-nourished.  HENT:  Head: Normocephalic and atraumatic.  Eyes: EOM are normal.  Neck: Neck supple.  Pulmonary/Chest: Effort normal.  Abdominal: Soft.  Neurological: She is alert and oriented to person, place, and time.  Skin: Skin is warm. Capillary refill takes less than 2 seconds.  Psychiatric: She has a normal mood and affect. Her behavior is normal. Judgment and thought content normal.  Nursing note and vitals reviewed.   Ortho Exam Bilateral knee exam shows no joint effusion. Excellent range of motion. Mild patellar crepitus. Collaterals and cruciates are stable. Specialty Comments:  No specialty comments available.  Imaging: No results found.   PMFS History: Patient Active Problem List   Diagnosis Date Noted  . Essential hypertension 06/28/2016  . Facial rash 06/28/2016  . Heart murmur 06/28/2016   Past Medical History:  Diagnosis Date  . Allergy   . Hypertension     No family history on file.  Past Surgical History:  Procedure Laterality Date  . CHOLECYSTECTOMY    . HERNIA REPAIR     Social History   Occupational History  . Not on file.   Social History Main Topics  . Smoking status: Never Smoker  . Smokeless tobacco: Never Used  . Alcohol use No  . Drug  use: No  . Sexual activity: Not on file

## 2016-07-03 NOTE — Telephone Encounter (Signed)
She can take occasionally.  Just shouldn't take it long term

## 2016-07-03 NOTE — Telephone Encounter (Signed)
Pt wants to know if she should take her medication prescribed: diclosenac . She states she has hypertension and it says on the bottle do not take if you have high bp.  Please advise.  [phone]

## 2016-07-04 NOTE — Telephone Encounter (Signed)
Thanks Renay!

## 2016-07-04 NOTE — Telephone Encounter (Signed)
Spoke to patient.  She saw ortho yesterday and they gave her an RX for the tablet form of the get you had prescribed She also is exercising her knee and feels much better.  She said she would look externally for a knee brace, but if she couldn't find one she would get it here or at ortho.

## 2016-07-04 NOTE — Telephone Encounter (Signed)
[name], I just saw your previous message regarding this patient.  I called and left her a message with your instructions. Please disregard previous message I sent. Thanks, [employer]

## 2016-07-04 NOTE — Telephone Encounter (Signed)
Received denial from cover my meds on the diclofenac gel.  Please advise next step.

## 2016-07-04 NOTE — Telephone Encounter (Signed)
LMOM with details.  See message below.

## 2016-08-16 ENCOUNTER — Ambulatory Visit (INDEPENDENT_AMBULATORY_CARE_PROVIDER_SITE_OTHER): Payer: BLUE CROSS/BLUE SHIELD | Admitting: Emergency Medicine

## 2016-08-16 ENCOUNTER — Encounter: Payer: Self-pay | Admitting: Emergency Medicine

## 2016-08-16 VITALS — BP 134/80 | HR 80 | Temp 98.3°F | Resp 16 | Ht 64.75 in | Wt 232.6 lb

## 2016-08-16 DIAGNOSIS — H9201 Otalgia, right ear: Secondary | ICD-10-CM

## 2016-08-16 DIAGNOSIS — I10 Essential (primary) hypertension: Secondary | ICD-10-CM

## 2016-08-16 DIAGNOSIS — H6121 Impacted cerumen, right ear: Secondary | ICD-10-CM

## 2016-08-16 MED ORDER — AMLODIPINE BESYLATE 10 MG PO TABS: 10.0000 mg | ORAL_TABLET | Freq: Every day | ORAL | 3 refills | Status: DC

## 2016-08-16 MED ORDER — ACETIC ACID 2 % OT SOLN: 4.0000 [drp] | Freq: Three times a day (TID) | OTIC | 0 refills | Status: AC

## 2016-08-16 NOTE — Progress Notes (Addendum)
Kayla Moody 51 y.o.   Chief Complaint  Patient presents with  . Ear Pain     right ear,intermittent for months, worse over the past week, hurts to lie down and tried debrox to help thought maybe blocked but no relier    HISTORY OF PRESENT ILLNESS: [name] complaining of right ear pain and fullness.  HPI   Prior to Admission medications   Medication Sig Start Date End Date Taking? Authorizing Provider  amLODipine (NORVASC) 10 MG tablet Take 1 tablet (10 mg total) by mouth daily. 08/16/16  Yes Kayla Moody, Kayla Kempf, MD  diclofenac (VOLTAREN) 75 MG EC tablet Take 1 tablet (75 mg total) by mouth 2 (two) times daily. 07/03/16  Yes Kayla Kos, MD  diclofenac sodium (VOLTAREN) 1 % GEL Apply 2 g topically 4 (four) times daily. 06/15/16  Yes Kayla Moody, Kayla D, PA-C  lisinopril (PRINIVIL,ZESTRIL) 20 MG tablet Take 1 tablet (20 mg total) by mouth daily. 06/28/16  Yes Kayla Moody, Kayla Kempf, MD  omeprazole (PRILOSEC) 40 MG capsule Take 1 capsule (40 mg total) by mouth 2 (two) times daily. 03/14/16  Yes Kayla Neighbors, FNP  fluticasone (FLONASE) 50 MCG/ACT nasal spray Place 2 sprays into both nostrils daily. Patient not taking: Reported on 07/03/2016 02/14/16   Kayla Core D, PA-C  hydrochlorothiazide (HYDRODIURIL) 25 MG tablet Take 1 tablet (25 mg total) by mouth daily. 06/28/16 07/28/16  Kayla Quint, MD  meloxicam (MOBIC) 7.5 MG tablet Take 1 tablet (7.5 mg total) by mouth daily. Patient not taking: Reported on 08/16/2016 06/15/16   Kayla Core D, PA-C    Allergies  Allergen Reactions  . Penicillins     Patient Active Problem List   Diagnosis Date Noted  . Essential hypertension 06/28/2016  . Facial rash 06/28/2016  . Heart murmur 06/28/2016    Past Medical History:  Diagnosis Date  . Allergy   . Hypertension     Past Surgical History:  Procedure Laterality Date  . CHOLECYSTECTOMY    . HERNIA REPAIR      Social History   Social History    . Marital status: Married    Spouse name: N/A  . Number of children: N/A  . Years of education: N/A   Occupational History  . Not on file.   Social History Main Topics  . Smoking status: Never Smoker  . Smokeless tobacco: Never Used  . Alcohol use No  . Drug use: No  . Sexual activity: Not on file   Other Topics Concern  . Not on file   Social History Narrative  . No narrative on file    No family history on file.   Review of Systems  Constitutional: Negative.  Negative for chills and fever.  HENT: Positive for ear pain. Negative for nosebleeds.   Eyes: Negative for discharge and redness.  Respiratory: Negative for shortness of breath.   Cardiovascular: Negative for chest pain.  Gastrointestinal: Negative for nausea and vomiting.  Musculoskeletal: Positive for joint pain (both knees).  Skin: Negative.  Negative for rash.  Neurological: Negative for dizziness and headaches.  All other systems reviewed and are negative.  Vitals:   08/16/16 0956 08/16/16 1022  BP: (!) 143/91 134/80  Pulse: 80   Resp: 16   Temp: 98.3 F (36.8 C)      Physical Exam  Constitutional: She is oriented to person, place, and time. She appears well-developed and well-nourished.  HENT:  Head: Normocephalic and atraumatic.  Right  ext canal impacted with cerumen  Eyes: EOM are normal. Pupils are equal, round, and reactive to light.  Neck: Normal range of motion. Neck supple.  Cardiovascular: Normal rate and regular rhythm.   Pulmonary/Chest: Effort normal and breath sounds normal.  Musculoskeletal: Normal range of motion.  Neurological: She is alert and oriented to person, place, and time.  Skin: Skin is warm. Capillary refill takes less than 2 seconds.  Psychiatric: She has a normal mood and affect. Her behavior is normal.  Vitals reviewed.  Ceruminosis is noted.  Wax is removed by syringing and manual debridement. Instructions for home care to prevent wax buildup are given.   Post-procedure exam of right ear: canal clear of wax; TM: WNL, no perforation or infection.  ASSESSMENT & PLAN: [name] was seen today for ear pain.  Diagnoses and all orders for this visit:  Impacted cerumen of right ear -     Ear wax removal  Essential hypertension -     amLODipine (NORVASC) 10 MG tablet; Take 1 tablet (10 mg total) by mouth daily.  Otalgia of right ear  Other orders -     acetic acid (VOSOL) 2 % otic solution; Place 4 drops into the right ear 3 (three) times daily.    Patient Instructions       IF you received an x-ray today, you will receive an invoice from [facility]. Please contact [facility] at [phone] with questions or concerns regarding your invoice.   IF you received labwork today, you will receive an invoice from [facility]. Please contact [facility] at [phone] with questions or concerns regarding your invoice.   Our billing staff will not be able to assist you with questions regarding bills from these companies.  You will be contacted with the lab results as soon as they are available. The fastest way to get your results is to activate your My Chart account. Instructions are located on the last page of this paperwork. If you have not heard from [place] regarding the results in 2 weeks, please contact this office.    We recommend that you schedule a mammogram for breast cancer screening. Typically, you do not need a referral to do this. Please contact a local imaging center to schedule your mammogram.  [facility] - [phone]  *ask for the Radiology Department [facility]) - [phone] or [phone]  [facility] - [phone] [facility] - [phone] [facility] - [phone]  *ask for the Radiology Department [facility] - [phone]  *ask for the Radiology Department [facility] - [phone]  *ask for the  Mammography Department [facility] - [phone] Earwax Buildup, Adult The ears produce a substance called earwax that helps keep bacteria out of the ear and protects the skin in the ear canal. Occasionally, earwax can build up in the ear and cause discomfort or hearing loss. What increases the risk? This condition is more likely to develop in people who:  Are female.  Are elderly.  Naturally produce more earwax.  Clean their ears often with cotton swabs.  Use earplugs often.  Use in-ear headphones often.  Wear hearing aids.  Have narrow ear canals.  Have earwax that is overly thick or sticky.  Have eczema.  Are dehydrated.  Have excess hair in the ear canal.  What are the signs or symptoms? Symptoms of this condition include:  Reduced or muffled hearing.  A feeling of  fullness in the ear or feeling that the ear is plugged.  Fluid coming from the ear.  Ear pain.  Ear itch.  Ringing in the ear.  Coughing.  An obvious piece of earwax that can be seen inside the ear canal.  How is this diagnosed? This condition may be diagnosed based on:  Your symptoms.  Your medical history.  An ear exam. During the exam, your health care provider will look into your ear with an instrument called an otoscope.  You may have tests, including a hearing test. How is this treated? This condition may be treated by:  Using ear drops to soften the earwax.  Having the earwax removed by a health care provider. The health care provider may: ? Flush the ear with water. ? Use an instrument that has a loop on the end (curette). ? Use a suction device.  Surgery to remove the wax buildup. This may be done in severe cases.  Follow these instructions at home:  Take over-the-counter and prescription medicines only as told by your health care provider.  Do not put any objects, including cotton swabs, into your ear. You can clean the opening of your ear canal with a  washcloth or facial tissue.  Follow instructions from your health care provider about cleaning your ears. Do not over-clean your ears.  Drink enough fluid to keep your urine clear or pale yellow. This will help to thin the earwax.  Keep all follow-up visits as told by your health care provider. If earwax builds up in your ears often or if you use hearing aids, consider seeing your health care provider for routine, preventive ear cleanings. Ask your health care provider how often you should schedule your cleanings.  If you have hearing aids, clean them according to instructions from the manufacturer and your health care provider. Contact a health care provider if:  You have ear pain.  You develop a fever.  You have blood, pus, or other fluid coming from your ear.  You have hearing loss.  You have ringing in your ears that does not go away.  Your symptoms do not improve with treatment.  You feel like the room is spinning (vertigo). Summary  Earwax can build up in the ear and cause discomfort or hearing loss.  The most common symptoms of this condition include reduced or muffled hearing and a feeling of fullness in the ear or feeling that the ear is plugged.  This condition may be diagnosed based on your symptoms, your medical history, and an ear exam.  This condition may be treated by using ear drops to soften the earwax or by having the earwax removed by a health care provider.  Do not put any objects, including cotton swabs, into your ear. You can clean the opening of your ear canal with a washcloth or facial tissue. This information is not intended to replace advice given to you by your health care provider. Make sure you discuss any questions you have with your health care provider. Document Released: 04/05/2004 Document Revised: 05/09/2016 Document Reviewed: 05/09/2016 Elsevier Interactive Patient Education  2018 Elsevier Inc.      Edwina BarthMiguel Raine Blodgett, MD [facility]

## 2016-08-16 NOTE — Patient Instructions (Addendum)
IF you received an x-ray today, you will receive an invoice from [facility]. Please contact [facility] at [phone] with questions or concerns regarding your invoice.   IF you received labwork today, you will receive an invoice from [facility]. Please contact [facility] at [phone] with questions or concerns regarding your invoice.   Our billing staff will not be able to assist you with questions regarding bills from these companies.  You will be contacted with the lab results as soon as they are available. The fastest way to get your results is to activate your My Chart account. Instructions are located on the last page of this paperwork. If you have not heard from [place] regarding the results in 2 weeks, please contact this office.    We recommend that you schedule a mammogram for breast cancer screening. Typically, you do not need a referral to do this. Please contact a local imaging center to schedule your mammogram.  [facility] - [phone]  *ask for the Radiology Department [facility]) - [phone] or [phone]  [facility] - [phone] [facility] - [phone] [facility] - [phone]  *ask for the Radiology Department [facility] - [phone]  *ask for the Radiology Department [facility] - [phone]  *ask for the Mammography Department [facility] - [phone] Earwax Buildup, Adult The ears produce a substance called earwax that helps keep bacteria out of the ear and protects the skin in the ear canal. Occasionally, earwax can build up in the ear and cause discomfort or hearing loss. What increases the risk? This condition is more likely to develop in people who:  Are female.  Are elderly.  Naturally produce more earwax.  Clean their ears often with cotton swabs.  Use earplugs often.  Use in-ear headphones  often.  Wear hearing aids.  Have narrow ear canals.  Have earwax that is overly thick or sticky.  Have eczema.  Are dehydrated.  Have excess hair in the ear canal.  What are the signs or symptoms? Symptoms of this condition include:  Reduced or muffled hearing.  A feeling of fullness in the ear or feeling that the ear is plugged.  Fluid coming from the ear.  Ear pain.  Ear itch.  Ringing in the ear.  Coughing.  An obvious piece of earwax that can be seen inside the ear canal.  How is this diagnosed? This condition may be diagnosed based on:  Your symptoms.  Your medical history.  An ear exam. During the exam, your health care provider will look into your ear with an instrument called an otoscope.  You may have tests, including a hearing test. How is this treated? This condition may be treated by:  Using ear drops to soften the earwax.  Having the earwax removed by a health care provider. The health care provider may: ? Flush the ear with water. ? Use an instrument that has a loop on the end (curette). ? Use a suction device.  Surgery to remove the wax buildup. This may be done in severe cases.  Follow these instructions at home:  Take over-the-counter and prescription medicines only as told by your health care provider.  Do not put any objects, including cotton swabs, into your ear. You can clean the opening of your ear canal with a washcloth or facial tissue.  Follow instructions from your health care provider about cleaning your  ears. Do not over-clean your ears.  Drink enough fluid to keep your urine clear or pale yellow. This will help to thin the earwax.  Keep all follow-up visits as told by your health care provider. If earwax builds up in your ears often or if you use hearing aids, consider seeing your health care provider for routine, preventive ear cleanings. Ask your health care provider how often you should schedule your cleanings.  If you  have hearing aids, clean them according to instructions from the manufacturer and your health care provider. Contact a health care provider if:  You have ear pain.  You develop a fever.  You have blood, pus, or other fluid coming from your ear.  You have hearing loss.  You have ringing in your ears that does not go away.  Your symptoms do not improve with treatment.  You feel like the room is spinning (vertigo). Summary  Earwax can build up in the ear and cause discomfort or hearing loss.  The most common symptoms of this condition include reduced or muffled hearing and a feeling of fullness in the ear or feeling that the ear is plugged.  This condition may be diagnosed based on your symptoms, your medical history, and an ear exam.  This condition may be treated by using ear drops to soften the earwax or by having the earwax removed by a health care provider.  Do not put any objects, including cotton swabs, into your ear. You can clean the opening of your ear canal with a washcloth or facial tissue. This information is not intended to replace advice given to you by your health care provider. Make sure you discuss any questions you have with your health care provider. Document Released: 04/05/2004 Document Revised: 05/09/2016 Document Reviewed: 05/09/2016 Elsevier Interactive Patient Education  [employer]2018 Elsevier Inc.

## 2016-08-31 DIAGNOSIS — B360 Pityriasis versicolor: Secondary | ICD-10-CM | POA: Diagnosis not present

## 2016-08-31 DIAGNOSIS — L700 Acne vulgaris: Secondary | ICD-10-CM | POA: Diagnosis not present

## 2016-09-14 ENCOUNTER — Other Ambulatory Visit: Payer: Self-pay | Admitting: Emergency Medicine

## 2016-09-14 DIAGNOSIS — I10 Essential (primary) hypertension: Secondary | ICD-10-CM

## 2016-09-14 MED ORDER — AMLODIPINE BESYLATE 10 MG PO TABS: 10.0000 mg | ORAL_TABLET | Freq: Every day | ORAL | 3 refills | Status: DC

## 2016-09-18 ENCOUNTER — Encounter (INDEPENDENT_AMBULATORY_CARE_PROVIDER_SITE_OTHER): Payer: Self-pay | Admitting: Orthopaedic Surgery

## 2016-09-18 ENCOUNTER — Ambulatory Visit (INDEPENDENT_AMBULATORY_CARE_PROVIDER_SITE_OTHER): Payer: BLUE CROSS/BLUE SHIELD | Admitting: Orthopaedic Surgery

## 2016-09-18 DIAGNOSIS — M1711 Unilateral primary osteoarthritis, right knee: Secondary | ICD-10-CM | POA: Diagnosis not present

## 2016-09-18 DIAGNOSIS — M1712 Unilateral primary osteoarthritis, left knee: Secondary | ICD-10-CM | POA: Diagnosis not present

## 2016-09-18 MED ORDER — BUPIVACAINE HCL 0.5 % IJ SOLN
2.0000 mL | INTRAMUSCULAR | Status: AC | PRN
  Administered 2016-09-18: 2 mL via INTRA_ARTICULAR

## 2016-09-18 MED ORDER — METHYLPREDNISOLONE ACETATE 40 MG/ML IJ SUSP
40.0000 mg | INTRAMUSCULAR | Status: AC | PRN
  Administered 2016-09-18: 40 mg via INTRA_ARTICULAR

## 2016-09-18 MED ORDER — LIDOCAINE HCL 1 % IJ SOLN
2.0000 mL | INTRAMUSCULAR | Status: AC | PRN
  Administered 2016-09-18: 2 mL

## 2016-09-18 NOTE — Progress Notes (Signed)
   Office Visit Note   Patient: Kayla AskewKimberly Pulse           Date of Birth: 02/25/1966           MRN: [id number] Visit Date: 09/18/2016              Requested by: No referring provider defined for this encounter. PCP: Patient, No Pcp Per   Assessment & Plan: Visit Diagnoses:  1. Unilateral primary osteoarthritis, left knee   2. Unilateral primary osteoarthritis, right knee     Plan: Bilateral knee injections with cortisone were performed today. Patient tolerates well. Pamphlet on Synvisc was given today. Follow-up with me as needed.  Follow-Up Instructions: Return if symptoms worsen or fail to improve.   Orders:  No orders of the defined types were placed in this encounter.  No orders of the defined types were placed in this encounter.     Procedures: Large Joint Inj Date/Time: 09/18/2016 8:31 AM Performed by: Tarry KosXU, NAIPING M Authorized by: Tarry KosXU, NAIPING M   Consent Given by:  Patient Timeout: prior to procedure the correct patient, procedure, and site was verified   Indications:  Pain Location:  Knee Site:  R knee Prep: patient was prepped and draped in usual sterile fashion   Needle Size:  22 G Ultrasound Guidance: No   Fluoroscopic Guidance: No   Arthrogram: No   Patient tolerance:  Patient tolerated the procedure well with no immediate complications Large Joint Inj Date/Time: 09/18/2016 8:31 AM Performed by: Tarry KosXU, NAIPING M Authorized by: Tarry KosXU, NAIPING M   Consent Given by:  Patient Timeout: prior to procedure the correct patient, procedure, and site was verified   Indications:  Pain Location:  Knee Site:  L knee Prep: patient was prepped and draped in usual sterile fashion   Needle Size:  22 G Ultrasound Guidance: No   Fluoroscopic Guidance: No   Arthrogram: No   Medications:  2 mL lidocaine 1 %; 2 mL bupivacaine 0.5 %; 40 mg methylPREDNISolone acetate 40 MG/ML Patient tolerance:  Patient tolerated the procedure well with no immediate complications     Clinical  Data: No additional findings.   Subjective: Chief Complaint  Patient presents with  . Right Knee - Pain  . Left Knee - Pain    Patient comes back today for bilateral knee pain. She has moderate osteoarthritis. She would like to try injection today she is standing all day.    Review of Systems   Objective: Vital Signs: There were no vitals taken for this visit.  Physical Exam  Ortho Exam Bilateral knee exams are stable. Specialty Comments:  No specialty comments available.  Imaging: No results found.   PMFS History: Patient Active Problem List   Diagnosis Date Noted  . Impacted cerumen of right ear 08/16/2016  . Otalgia of right ear 08/16/2016  . Essential hypertension 06/28/2016  . Facial rash 06/28/2016  . Heart murmur 06/28/2016   Past Medical History:  Diagnosis Date  . Allergy   . Hypertension     No family history on file.  Past Surgical History:  Procedure Laterality Date  . CHOLECYSTECTOMY    . HERNIA REPAIR     Social History   Occupational History  . Not on file.   Social History Main Topics  . Smoking status: Never Smoker  . Smokeless tobacco: Never Used  . Alcohol use No  . Drug use: No  . Sexual activity: Not on file

## 2016-10-22 ENCOUNTER — Ambulatory Visit (INDEPENDENT_AMBULATORY_CARE_PROVIDER_SITE_OTHER): Payer: BLUE CROSS/BLUE SHIELD | Admitting: Emergency Medicine

## 2016-10-22 ENCOUNTER — Encounter: Payer: Self-pay | Admitting: Emergency Medicine

## 2016-10-22 VITALS — BP 138/86 | HR 90 | Temp 97.9°F | Resp 16 | Ht 64.5 in | Wt 231.8 lb

## 2016-10-22 DIAGNOSIS — Z1211 Encounter for screening for malignant neoplasm of colon: Secondary | ICD-10-CM | POA: Diagnosis not present

## 2016-10-22 DIAGNOSIS — G44221 Chronic tension-type headache, intractable: Secondary | ICD-10-CM

## 2016-10-22 DIAGNOSIS — Z1231 Encounter for screening mammogram for malignant neoplasm of breast: Secondary | ICD-10-CM | POA: Diagnosis not present

## 2016-10-22 DIAGNOSIS — Z1239 Encounter for other screening for malignant neoplasm of breast: Secondary | ICD-10-CM

## 2016-10-22 HISTORY — DX: Chronic tension-type headache, intractable: G44.221

## 2016-10-22 MED ORDER — BUTALBITAL-APAP-CAFFEINE 50-325-40 MG PO TABS: 1.0000 | ORAL_TABLET | Freq: Four times a day (QID) | ORAL | 0 refills | Status: AC | PRN

## 2016-10-22 NOTE — Progress Notes (Signed)
Kayla Moody 51 y.o.   Chief Complaint  Patient presents with  . Headache    WITH BLURRED VISION x 2 weeks on/off    HISTORY OF PRESENT ILLNESS: [name] complaining of headaches x 2 weeks.  Headache   This is a recurrent problem. The current episode started 1 to 4 weeks ago. The problem occurs intermittently. The problem has been waxing and waning. The pain is located in the bilateral region. The pain does not radiate. The pain quality is similar to prior headaches. The quality of the pain is described as throbbing. The pain is at a severity of 6/10. The pain is moderate. Associated symptoms include blurred vision, eye pain, phonophobia and a visual change. Pertinent negatives include no abdominal pain, abnormal behavior, coughing, dizziness, ear pain, eye redness, eye watering, fever, hearing loss, loss of balance, nausea, neck pain, numbness, rhinorrhea, seizures, sinus pressure, sore throat or vomiting. She has tried NSAIDs and Excedrin for the symptoms. The treatment provided mild relief.     Prior to Admission medications   Medication Sig Start Date End Date Taking? Authorizing Provider  amLODipine (NORVASC) 10 MG tablet Take 1 tablet (10 mg total) by mouth daily. 09/14/16  Yes Terell Kincy, Eilleen KempfMiguel Jose, MD  lisinopril (PRINIVIL,ZESTRIL) 20 MG tablet Take 1 tablet (20 mg total) by mouth daily. 06/28/16  Yes Peggy Loge, Eilleen KempfMiguel Jose, MD  omeprazole (PRILOSEC) 40 MG capsule Take 1 capsule (40 mg total) by mouth 2 (two) times daily. 03/14/16  Yes Bing NeighborsHarris, Jaidon S, FNP  diclofenac (VOLTAREN) 75 MG EC tablet Take 1 tablet (75 mg total) by mouth 2 (two) times daily. Patient not taking: Reported on 10/22/2016 07/03/16   Tarry KosXu, Naiping M, MD  diclofenac sodium (VOLTAREN) 1 % GEL Apply 2 g topically 4 (four) times daily. Patient not taking: Reported on 10/22/2016 06/15/16   Benjiman CoreWiseman, Brittany D, PA-C  fluticasone [facility](FLONASE) 50 MCG/ACT nasal spray Place 2 sprays into both nostrils  daily. Patient not taking: Reported on 10/22/2016 02/14/16   Benjiman CoreWiseman, Brittany D, PA-C  hydrochlorothiazide (HYDRODIURIL) 25 MG tablet Take 1 tablet (25 mg total) by mouth daily. 06/28/16 07/28/16  Georgina QuintSagardia, Benz Vandenberghe Jose, MD  meloxicam (MOBIC) 7.5 MG tablet Take 1 tablet (7.5 mg total) by mouth daily. Patient not taking: Reported on 08/16/2016 06/15/16   Benjiman CoreWiseman, Brittany D, PA-C    Allergies  Allergen Reactions  . Penicillins     Patient Active Problem List   Diagnosis Date Noted  . Impacted cerumen of right ear 08/16/2016  . Otalgia of right ear 08/16/2016  . Essential hypertension 06/28/2016  . Facial rash 06/28/2016  . Heart murmur 06/28/2016    Past Medical History:  Diagnosis Date  . Allergy   . Hypertension     Past Surgical History:  Procedure Laterality Date  . CHOLECYSTECTOMY    . HERNIA REPAIR      Social History   Social History  . Marital status: Married    Spouse name: N/A  . Number of children: N/A  . Years of education: N/A   Occupational History  . Not on file.   Social History Main Topics  . Smoking status: Never Smoker  . Smokeless tobacco: Never Used  . Alcohol use No  . Drug use: No  . Sexual activity: Not on file   Other Topics Concern  . Not on file   Social History Narrative  . No narrative on file    No family history on file.   Review  of Systems  Constitutional: Negative for chills and fever.  HENT: Negative.  Negative for ear pain, hearing loss, nosebleeds, rhinorrhea, sinus pain, sinus pressure and sore throat.   Eyes: Positive for blurred vision and pain. Negative for redness.  Respiratory: Negative for cough and shortness of breath.   Cardiovascular: Negative.  Negative for chest pain and palpitations.  Gastrointestinal: Negative for abdominal pain, diarrhea, nausea and vomiting.  Genitourinary: Negative.   Musculoskeletal: Negative for myalgias and neck pain.  Skin: Negative.  Negative for rash.  Neurological: Positive for  headaches. Negative for dizziness, seizures, numbness and loss of balance.  Endo/Heme/Allergies: Negative.   All other systems reviewed and are negative.    Vitals:   10/22/16 1502  BP: 138/86  Pulse: 90  Resp: 16  Temp: 97.9 F (36.6 C)  SpO2: 96%     Physical Exam  Constitutional: She is oriented to person, place, and time. She appears well-developed.  Obese.  HENT:  Head: Normocephalic and atraumatic.  Nose: Nose normal.  Mouth/Throat: Oropharynx is clear and moist.  Eyes: Pupils are equal, round, and reactive to light. Conjunctivae and EOM are normal.  Fundoscopic exam:      The right eye shows no hemorrhage and no papilledema.       The left eye shows no hemorrhage and no papilledema.  Neck: Normal range of motion. Neck supple. No JVD present. No thyromegaly present.  Cardiovascular: Normal rate, regular rhythm, normal heart sounds and intact distal pulses.   Pulmonary/Chest: Effort normal and breath sounds normal.  Abdominal: Soft. Bowel sounds are normal. She exhibits no distension. There is no tenderness.  Musculoskeletal: Normal range of motion.  Lymphadenopathy:    She has no cervical adenopathy.  Neurological: She is alert and oriented to person, place, and time. No sensory deficit. She exhibits normal muscle tone.  Skin: Skin is warm and dry. Capillary refill takes less than 2 seconds. No rash noted.  Psychiatric: She has a normal mood and affect. Her behavior is normal.  Vitals reviewed.    ASSESSMENT & PLAN: Willowdean was seen today for headache.  Diagnoses and all orders for this visit:  Chronic tension-type headache, intractable  Breast cancer screening -     MM DIGITAL SCREENING BILATERAL; Future  Colon cancer screening -     Ambulatory referral to Gastroenterology  Other orders -     butalbital-acetaminophen-caffeine (FIORICET, ESGIC) 50-325-40 MG tablet; Take 1 tablet by mouth every 6 (six) hours as needed for headache.    Patient  Instructions       IF you received an x-ray today, you will receive an invoice from [facility]. Please contact [facility] at [phone] with questions or concerns regarding your invoice.   IF you received labwork today, you will receive an invoice from [place]. Please contact [facility] at [phone] with questions or concerns regarding your invoice.   Our billing staff will not be able to assist you with questions regarding bills from these companies.  You will be contacted with the lab results as soon as they are available. The fastest way to get your results is to activate your My Chart account. Instructions are located on the last page of this paperwork. If you have not heard from [place] regarding the results in 2 weeks, please contact this office.     Tension Headache A tension headache is pain, pressure, or aching that is felt over the front and sides of your head. These headaches can last from 30 minutes to  several days. Follow these instructions at home: Managing pain  Take over-the-counter and prescription medicines only as told by your doctor.  Lie down in a dark, quiet room when you have a headache.  If directed, apply ice to your head and neck area: ? Put ice in a plastic bag. ? Place a towel between your skin and the bag. ? Leave the ice on for 20 minutes, 2-3 times per day.  Use a heating pad or a hot shower to apply heat to your head and neck area as told by your doctor. Eating and drinking  Eat meals on a regular schedule.  Do not drink a lot of alcohol.  Do not use a lot of caffeine, or stop using caffeine. General instructions  Keep all follow-up visits as told by your doctor. This is important.  Keep a journal to find out if certain things bring on headaches. For example, write down: ? What you eat and drink. ? How much sleep you get. ? Any change to your diet or medicines.  Try getting a massage, or doing other things that help you  to relax.  Lessen stress.  Sit up straight. Do not tighten (tense) your muscles.  Do not use tobacco products. This includes cigarettes, chewing tobacco, or e-cigarettes. If you need help quitting, ask your doctor.  Exercise regularly as told by your doctor.  Get enough sleep. This may mean 7-9 hours of sleep. Contact a doctor if:  Your symptoms are not helped by medicine.  You have a headache that feels different from your usual headache.  You feel sick to your stomach (nauseous) or you throw up (vomit).  You have a fever. Get help right away if:  Your headache becomes very bad.  You keep throwing up.  You have a stiff neck.  You have trouble seeing.  You have trouble speaking.  You have pain in your eye or ear.  Your muscles are weak or you lose muscle control.  You lose your balance or you have trouble walking.  You feel like you will pass out (faint) or you pass out.  You have confusion. This information is not intended to replace advice given to you by your health care provider. Make sure you discuss any questions you have with your health care provider. Document Released: 05/23/2009 Document Revised: 10/27/2015 Document Reviewed: 06/21/2014 Elsevier Interactive Patient Education  2018 [employer].      Edwina Barth, MD [facility]

## 2016-10-22 NOTE — Patient Instructions (Addendum)
     IF you received an x-ray today, you will receive an invoice from [facility]. Please contact [facility] at [phone] with questions or concerns regarding your invoice.   IF you received labwork today, you will receive an invoice from [facility]. Please contact [facility] at [phone] with questions or concerns regarding your invoice.   Our billing staff will not be able to assist you with questions regarding bills from these companies.  You will be contacted with the lab results as soon as they are available. The fastest way to get your results is to activate your My Chart account. Instructions are located on the last page of this paperwork. If you have not heard from us regarding the results in 2 weeks, please contact this office.     Tension Headache A tension headache is pain, pressure, or aching that is felt over the front and sides of your head. These headaches can last from 30 minutes to several days. Follow these instructions at home: Managing pain  Take over-the-counter and prescription medicines only as told by your doctor.  Lie down in a dark, quiet room when you have a headache.  If directed, apply ice to your head and neck area: ? Put ice in a plastic bag. ? Place a towel between your skin and the bag. ? Leave the ice on for 20 minutes, 2-3 times per day.  Use a heating pad or a hot shower to apply heat to your head and neck area as told by your doctor. Eating and drinking  Eat meals on a regular schedule.  Do not drink a lot of alcohol.  Do not use a lot of caffeine, or stop using caffeine. General instructions  Keep all follow-up visits as told by your doctor. This is important.  Keep a journal to find out if certain things bring on headaches. For example, write down: ? What you eat and drink. ? How much sleep you get. ? Any change to your diet or medicines.  Try getting a massage, or doing other things that help you to  relax.  Lessen stress.  Sit up straight. Do not tighten (tense) your muscles.  Do not use tobacco products. This includes cigarettes, chewing tobacco, or e-cigarettes. If you need help quitting, ask your doctor.  Exercise regularly as told by your doctor.  Get enough sleep. This may mean 7-9 hours of sleep. Contact a doctor if:  Your symptoms are not helped by medicine.  You have a headache that feels different from your usual headache.  You feel sick to your stomach (nauseous) or you throw up (vomit).  You have a fever. Get help right away if:  Your headache becomes very bad.  You keep throwing up.  You have a stiff neck.  You have trouble seeing.  You have trouble speaking.  You have pain in your eye or ear.  Your muscles are weak or you lose muscle control.  You lose your balance or you have trouble walking.  You feel like you will pass out (faint) or you pass out.  You have confusion. This information is not intended to replace advice given to you by your health care provider. Make sure you discuss any questions you have with your health care provider. Document Released: 05/23/2009 Document Revised: 10/27/2015 Document Reviewed: 06/21/2014 Elsevier Interactive Patient Education  [employer]2018 Elsevier Inc[employer]

## 2016-12-13 DIAGNOSIS — L700 Acne vulgaris: Secondary | ICD-10-CM | POA: Diagnosis not present

## 2016-12-26 ENCOUNTER — Encounter: Payer: Self-pay | Admitting: Emergency Medicine

## 2016-12-28 ENCOUNTER — Ambulatory Visit: Payer: BLUE CROSS/BLUE SHIELD | Admitting: Emergency Medicine

## 2017-01-11 ENCOUNTER — Ambulatory Visit (INDEPENDENT_AMBULATORY_CARE_PROVIDER_SITE_OTHER): Payer: BLUE CROSS/BLUE SHIELD | Admitting: Orthopaedic Surgery

## 2017-01-11 DIAGNOSIS — M1711 Unilateral primary osteoarthritis, right knee: Secondary | ICD-10-CM | POA: Diagnosis not present

## 2017-01-11 DIAGNOSIS — M1712 Unilateral primary osteoarthritis, left knee: Secondary | ICD-10-CM

## 2017-01-11 MED ORDER — METHYLPREDNISOLONE ACETATE 40 MG/ML IJ SUSP
40.0000 mg | INTRAMUSCULAR | Status: AC | PRN
  Administered 2017-01-11: 40 mg via INTRA_ARTICULAR

## 2017-01-11 MED ORDER — BUPIVACAINE HCL 0.5 % IJ SOLN
2.0000 mL | INTRAMUSCULAR | Status: AC | PRN
  Administered 2017-01-11: 2 mL via INTRA_ARTICULAR

## 2017-01-11 MED ORDER — LIDOCAINE HCL 1 % IJ SOLN
2.0000 mL | INTRAMUSCULAR | Status: AC | PRN
  Administered 2017-01-11: 2 mL

## 2017-01-11 NOTE — Progress Notes (Signed)
Office Visit Note   Patient: Kayla Moody           Date of Birth: September 20, 1965           MRN: [id number] Visit Date: 01/11/2017              Requested by: No referring provider defined for this encounter. PCP: Patient, No Pcp Per   Assessment & Plan: Visit Diagnoses:  1. Unilateral primary osteoarthritis, left knee   2. Unilateral primary osteoarthritis, right knee     Plan: Bilateral knee injections were performed today for her osteoarthritis.  She will let us know when to submit request for Visco supplementation.  Follow-Up Instructions: Return if symptoms worsen or fail to improve.   Orders:  No orders of the defined types were placed in this encounter.  No orders of the defined types were placed in this encounter.     Procedures: Large Joint Inj Date/Time: 01/11/2017 11:29 AM Performed by: Tarry KosXU, NAIPING M Authorized by: Tarry KosXU, NAIPING M   Consent Given by:  Patient Timeout: prior to procedure the correct patient, procedure, and site was verified   Indications:  Pain Location:  Knee Site:  R knee Prep: patient was prepped and draped in usual sterile fashion   Needle Size:  22 G Approach:  Lateral Ultrasound Guidance: No   Fluoroscopic Guidance: No   Arthrogram: No   Medications:  2 mL bupivacaine 0.5 %; 40 mg methylPREDNISolone acetate 40 MG/ML; 2 mL lidocaine 1 % Patient tolerance:  Patient tolerated the procedure well with no immediate complications Large Joint Inj Date/Time: 01/11/2017 11:29 AM Performed by: Tarry KosXU, NAIPING M Authorized by: Tarry KosXU, NAIPING M   Consent Given by:  Patient Timeout: prior to procedure the correct patient, procedure, and site was verified   Indications:  Pain Location:  Knee Site:  L knee Prep: patient was prepped and draped in usual sterile fashion   Needle Size:  22 G Ultrasound Guidance: No   Fluoroscopic Guidance: No   Arthrogram: No   Medications:  2 mL lidocaine 1 %; 2 mL bupivacaine 0.5 %; 40 mg methylPREDNISolone acetate 40  MG/ML Patient tolerance:  Patient tolerated the procedure well with no immediate complications     Clinical Data: No additional findings.   Subjective: Chief Complaint  Patient presents with  . Left Knee - Pain  . Right Knee - Pain    Patient follows up today for bilateral knee osteoarthritis requesting another injection.  The previous injection lasted about 3 and half months.  Denies any changes    Review of Systems   Objective: Vital Signs: There were no vitals taken for this visit.  Physical Exam  Ortho Exam Bilateral knee exam is stable.  No joint effusion. Specialty Comments:  No specialty comments available.  Imaging: No results found.   PMFS History: Patient Active Problem List   Diagnosis Date Noted  . Chronic tension-type headache, intractable 10/22/2016  . Breast cancer screening 10/22/2016  . Colon cancer screening 10/22/2016  . Impacted cerumen of right ear 08/16/2016  . Otalgia of right ear 08/16/2016  . Essential hypertension 06/28/2016  . Facial rash 06/28/2016  . Heart murmur 06/28/2016   Past Medical History:  Diagnosis Date  . Allergy   . Hypertension     No family history on file.  Past Surgical History:  Procedure Laterality Date  . CHOLECYSTECTOMY    . HERNIA REPAIR     Social History   Occupational History  . Not on  file.   Social History Main Topics  . Smoking status: Never Smoker  . Smokeless tobacco: Never Used  . Alcohol use No  . Drug use: No  . Sexual activity: Not on file

## 2017-04-18 ENCOUNTER — Telehealth (INDEPENDENT_AMBULATORY_CARE_PROVIDER_SITE_OTHER): Payer: Self-pay | Admitting: Orthopaedic Surgery

## 2017-04-18 NOTE — Telephone Encounter (Signed)
Pt called to sched appt and would like to know can she receive her cortisone shot. Pt was waiting for approval form the office. I did sched pt for Thursday 04/25/17 @8 :15am.  01/11/17 order placed previous visit  Large joint injection/Arthrocentesis   Location Knee  Site R Knee

## 2017-04-18 NOTE — Telephone Encounter (Signed)
Called patient no answer LMOM to return my call.  

## 2017-04-19 NOTE — Telephone Encounter (Signed)
Called patient again and it sent me straight to voicemail. I will wait for a call back from patient

## 2017-04-23 ENCOUNTER — Encounter (INDEPENDENT_AMBULATORY_CARE_PROVIDER_SITE_OTHER): Payer: Self-pay | Admitting: Orthopaedic Surgery

## 2017-04-23 ENCOUNTER — Ambulatory Visit (INDEPENDENT_AMBULATORY_CARE_PROVIDER_SITE_OTHER): Payer: BLUE CROSS/BLUE SHIELD | Admitting: Orthopaedic Surgery

## 2017-04-23 DIAGNOSIS — M170 Bilateral primary osteoarthritis of knee: Secondary | ICD-10-CM

## 2017-04-23 MED ORDER — BUPIVACAINE HCL 0.25 % IJ SOLN
0.6600 mL | INTRAMUSCULAR | Status: AC | PRN
  Administered 2017-04-23: .66 mL via INTRA_ARTICULAR

## 2017-04-23 MED ORDER — METHYLPREDNISOLONE ACETATE 40 MG/ML IJ SUSP
13.3300 mg | INTRAMUSCULAR | Status: AC | PRN
  Administered 2017-04-23: 13.33 mg via INTRA_ARTICULAR

## 2017-04-23 MED ORDER — LIDOCAINE HCL 1 % IJ SOLN
3.0000 mL | INTRAMUSCULAR | Status: AC | PRN
  Administered 2017-04-23: 3 mL

## 2017-04-23 NOTE — Progress Notes (Signed)
Office Visit Note   Patient: Kayla Moody           Date of Birth: 1965-12-18           MRN: [id number] Visit Date: 04/23/2017              Requested by: No referring provider defined for this encounter. PCP: Patient, No Pcp Per   Assessment & Plan: Visit Diagnoses:  1. Primary localized osteoarthritis of knees, bilateral     Plan: Today, we will repeat her bilateral knee intra-articular cortisone injections.  If she gets less than 3 months relief she will call us and let us know and we will send for approval for bilateral Visco supplementation injections.  Otherwise, she will follow-up with us on an as-needed basis.  Follow-Up Instructions: Return if symptoms worsen or fail to improve.   Orders:  No orders of the defined types were placed in this encounter.  No orders of the defined types were placed in this encounter.     Procedures: Large Joint Inj: bilateral knee on 04/23/2017 8:44 AM Indications: pain Details: 22 G needle, anterolateral approach Medications (Right): 0.66 mL bupivacaine 0.25 %; 3 mL lidocaine 1 %; 13.33 mg methylPREDNISolone acetate 40 MG/ML Medications (Left): 0.66 mL bupivacaine 0.25 %; 3 mL lidocaine 1 %; 13.33 mg methylPREDNISolone acetate 40 MG/ML      Clinical Data: No additional findings.   Subjective: Chief Complaint  Patient presents with  . Left Knee - Pain, Follow-up  . Right Knee - Pain, Follow-up    HPI Kayla Moody comes in with recurrent bilateral knee pain.  History of primary localized osteoarthritis to both knees.  She has been treated in the past with intra-articular cortisone injections which have been of great relief.  The last cortisone injection she had to both knees were on 01/11/2017.  She had significant relief but the injections only lasted 3 months.  She comes in today for repeat injections.  She does note marked pain to both knees which is gradually worsened.  She has no pain at rest but she does have pain in the second  her feet hit the floor.  Worse at the end of the day.  She does note some locking and catching.  No instability.  Review of Systems as detailed in HPI.  All others reviewed and are negative.   Objective: Vital Signs: There were no vitals taken for this visit.  Physical Exam well-developed well-nourished female in no acute distress.  Alert and oriented x3.  Ortho Exam examination of both knees reveals marked varus deformity.  Moderate patellofemoral crepitus.  Range of motion 0-125 degrees.  Medial joint line tenderness.  Ligaments are stable.  She is nervous intact distally.  Specialty Comments:  No specialty comments available.  Imaging: No new imaging today.   PMFS History: Patient Active Problem List   Diagnosis Date Noted  . Primary localized osteoarthritis of knees, bilateral 04/23/2017  . Chronic tension-type headache, intractable 10/22/2016  . Breast cancer screening 10/22/2016  . Colon cancer screening 10/22/2016  . Impacted cerumen of right ear 08/16/2016  . Otalgia of right ear 08/16/2016  . Essential hypertension 06/28/2016  . Facial rash 06/28/2016  . Heart murmur 06/28/2016   Past Medical History:  Diagnosis Date  . Allergy   . Hypertension     History reviewed. No pertinent family history.  Past Surgical History:  Procedure Laterality Date  . CHOLECYSTECTOMY    . HERNIA REPAIR     Social History  Occupational History  . Not on file  Tobacco Use  . Smoking status: Never Smoker  . Smokeless tobacco: Never Used  Substance and Sexual Activity  . Alcohol use: No    Alcohol/week: 0.0 oz  . Drug use: No  . Sexual activity: Not on file

## 2017-04-25 ENCOUNTER — Ambulatory Visit (INDEPENDENT_AMBULATORY_CARE_PROVIDER_SITE_OTHER): Payer: BLUE CROSS/BLUE SHIELD | Admitting: Orthopaedic Surgery

## 2017-05-10 ENCOUNTER — Ambulatory Visit: Payer: BLUE CROSS/BLUE SHIELD | Admitting: Emergency Medicine

## 2017-05-13 ENCOUNTER — Ambulatory Visit: Payer: BLUE CROSS/BLUE SHIELD | Admitting: Physician Assistant

## 2017-05-14 ENCOUNTER — Encounter: Payer: Self-pay | Admitting: Emergency Medicine

## 2017-05-14 ENCOUNTER — Ambulatory Visit: Payer: BLUE CROSS/BLUE SHIELD | Admitting: Emergency Medicine

## 2017-05-14 VITALS — BP 161/87 | HR 85 | Temp 99.0°F | Resp 17 | Ht 65.5 in | Wt 242.0 lb

## 2017-05-14 DIAGNOSIS — Z1231 Encounter for screening mammogram for malignant neoplasm of breast: Secondary | ICD-10-CM | POA: Diagnosis not present

## 2017-05-14 DIAGNOSIS — I10 Essential (primary) hypertension: Secondary | ICD-10-CM

## 2017-05-14 DIAGNOSIS — Z1239 Encounter for other screening for malignant neoplasm of breast: Secondary | ICD-10-CM

## 2017-05-14 DIAGNOSIS — Z1211 Encounter for screening for malignant neoplasm of colon: Secondary | ICD-10-CM

## 2017-05-14 MED ORDER — LISINOPRIL 40 MG PO TABS: 40.0000 mg | ORAL_TABLET | Freq: Every day | ORAL | 3 refills | Status: DC

## 2017-05-14 MED ORDER — AMLODIPINE BESYLATE 10 MG PO TABS: 10.0000 mg | ORAL_TABLET | Freq: Every day | ORAL | 3 refills | Status: DC

## 2017-05-14 NOTE — Patient Instructions (Addendum)
   IF you received an x-ray today, you will receive an invoice from [facility]. Please contact [facility] at [phone] with questions or concerns regarding your invoice.   IF you received labwork today, you will receive an invoice from [facility]. Please contact [facility] at [phone] with questions or concerns regarding your invoice.   Our billing staff will not be able to assist you with questions regarding bills from these companies.  You will be contacted with the lab results as soon as they are available. The fastest way to get your results is to activate your My Chart account. Instructions are located on the last page of this paperwork. If you have not heard from us regarding the results in 2 weeks, please contact this office.     Hypertension Hypertension, commonly called high blood pressure, is when the force of blood pumping through the arteries is too strong. The arteries are the blood vessels that carry blood from the heart throughout the body. Hypertension forces the heart to work harder to pump blood and may cause arteries to become narrow or stiff. Having untreated or uncontrolled hypertension can cause heart attacks, strokes, kidney disease, and other problems. A blood pressure reading consists of a higher number over a lower number. Ideally, your blood pressure should be below 120/80. The first ("top") number is called the systolic pressure. It is a measure of the pressure in your arteries as your heart beats. The second ("bottom") number is called the diastolic pressure. It is a measure of the pressure in your arteries as the heart relaxes. What are the causes? The cause of this condition is not known. What increases the risk? Some risk factors for high blood pressure are under your control. Others are not. Factors you can change  Smoking.  Having type 2 diabetes mellitus, high cholesterol, or both.  Not getting enough exercise or physical  activity.  Being overweight.  Having too much fat, sugar, calories, or salt (sodium) in your diet.  Drinking too much alcohol. Factors that are difficult or impossible to change  Having chronic kidney disease.  Having a family history of high blood pressure.  Age. Risk increases with age.  Race. You may be at higher risk if you are African-American.  Gender. Men are at higher risk than women before age 45. After age 65, women are at higher risk than men.  Having obstructive sleep apnea.  Stress. What are the signs or symptoms? Extremely high blood pressure (hypertensive crisis) may cause:  Headache.  Anxiety.  Shortness of breath.  Nosebleed.  Nausea and vomiting.  Severe chest pain.  Jerky movements you cannot control (seizures).  How is this diagnosed? This condition is diagnosed by measuring your blood pressure while you are seated, with your arm resting on a surface. The cuff of the blood pressure monitor will be placed directly against the skin of your upper arm at the level of your heart. It should be measured at least twice using the same arm. Certain conditions can cause a difference in blood pressure between your right and left arms. Certain factors can cause blood pressure readings to be lower or higher than normal (elevated) for a short period of time:  When your blood pressure is higher when you are in a health care provider's office than when you are at home, this is called white coat hypertension. Most people with this condition do not need medicines.  When your blood pressure is higher at home than when you   are in a health care provider's office, this is called masked hypertension. Most people with this condition may need medicines to control blood pressure.  If you have a high blood pressure reading during one visit or you have normal blood pressure with other risk factors:  You may be asked to return on a different day to have your blood pressure  checked again.  You may be asked to monitor your blood pressure at home for 1 week or longer.  If you are diagnosed with hypertension, you may have other blood or imaging tests to help your health care provider understand your overall risk for other conditions. How is this treated? This condition is treated by making healthy lifestyle changes, such as eating healthy foods, exercising more, and reducing your alcohol intake. Your health care provider may prescribe medicine if lifestyle changes are not enough to get your blood pressure under control, and if:  Your systolic blood pressure is above 130.  Your diastolic blood pressure is above 80.  Your personal target blood pressure may vary depending on your medical conditions, your age, and other factors. Follow these instructions at home: Eating and drinking  Eat a diet that is high in fiber and potassium, and low in sodium, added sugar, and fat. An example eating plan is called the DASH (Dietary Approaches to Stop Hypertension) diet. To eat this way: ? Eat plenty of fresh fruits and vegetables. Try to fill half of your plate at each meal with fruits and vegetables. ? Eat whole grains, such as whole wheat pasta, brown rice, or whole grain bread. Fill about one quarter of your plate with whole grains. ? Eat or drink low-fat dairy products, such as skim milk or low-fat yogurt. ? Avoid fatty cuts of meat, processed or cured meats, and poultry with skin. Fill about one quarter of your plate with lean proteins, such as fish, chicken without skin, beans, eggs, and tofu. ? Avoid premade and processed foods. These tend to be higher in sodium, added sugar, and fat.  Reduce your daily sodium intake. Most people with hypertension should eat less than 1,500 mg of sodium a day.  Limit alcohol intake to no more than 1 drink a day for nonpregnant women and 2 drinks a day for men. One drink equals 12 oz of beer, 5 oz of wine, or 1 oz of hard  liquor. Lifestyle  Work with your health care provider to maintain a healthy body weight or to lose weight. Ask what an ideal weight is for you.  Get at least 30 minutes of exercise that causes your heart to beat faster (aerobic exercise) most days of the week. Activities may include walking, swimming, or biking.  Include exercise to strengthen your muscles (resistance exercise), such as pilates or lifting weights, as part of your weekly exercise routine. Try to do these types of exercises for 30 minutes at least 3 days a week.  Do not use any products that contain nicotine or tobacco, such as cigarettes and e-cigarettes. If you need help quitting, ask your health care provider.  Monitor your blood pressure at home as told by your health care provider.  Keep all follow-up visits as told by your health care provider. This is important. Medicines  Take over-the-counter and prescription medicines only as told by your health care provider. Follow directions carefully. Blood pressure medicines must be taken as prescribed.  Do not skip doses of blood pressure medicine. Doing this puts you at risk for problems and   can make the medicine less effective.  Ask your health care provider about side effects or reactions to medicines that you should watch for. Contact a health care provider if:  You think you are having a reaction to a medicine you are taking.  You have headaches that keep coming back (recurring).  You feel dizzy.  You have swelling in your ankles.  You have trouble with your vision. Get help right away if:  You develop a severe headache or confusion.  You have unusual weakness or numbness.  You feel faint.  You have severe pain in your chest or abdomen.  You vomit repeatedly.  You have trouble breathing. Summary  Hypertension is when the force of blood pumping through your arteries is too strong. If this condition is not controlled, it may put you at risk for serious  complications.  Your personal target blood pressure may vary depending on your medical conditions, your age, and other factors. For most people, a normal blood pressure is less than 120/80.  Hypertension is treated with lifestyle changes, medicines, or a combination of both. Lifestyle changes include weight loss, eating a healthy, low-sodium diet, exercising more, and limiting alcohol. This information is not intended to replace advice given to you by your health care provider. Make sure you discuss any questions you have with your health care provider. Document Released: 02/26/2005 Document Revised: 01/25/2016 Document Reviewed: 01/25/2016 Elsevier Interactive Patient Education  2018 Elsevier Inc.  

## 2017-05-14 NOTE — Assessment & Plan Note (Signed)
Blood pressure of target today due to medication noncompliance.  Advised to stop hydrochlorothiazide.  We will continue amlodipine.  We will increase the dose of lisinopril to 40 mg a day.  Labs done today.  Will check renal function and electrolytes.  Advised to monitor blood pressure at home.  Follow-up in 3 months.

## 2017-05-14 NOTE — Progress Notes (Signed)
Kayla Moody 52 y.o.   Chief Complaint  Patient presents with  . Follow-up    hbp    BP Readings from Last 3 Encounters:  05/14/17 (!) 161/87  10/22/16 138/86  08/16/16 134/80    HISTORY OF PRESENT ILLNESS: This is a 52 y.o. female here for follow-up of hypertension.  Taking amlodipine and lisinopril but noncompliant with the diuretic.  States it makes her urinate too frequently.  Still gets occasional headaches.  Low stress in her life.  Has not been able to take blood pressure at home because machine is broken.  Did not take lisinopril today.  Asymptomatic with no medical concerns at this point.Blood work done last over a year ago.  HPI   Prior to Admission medications   Medication Sig Start Date End Date Taking? Authorizing Provider  amLODipine (NORVASC) 10 MG tablet Take 1 tablet (10 mg total) by mouth daily. 09/14/16  Yes Delana Manganello, Eilleen Kempf, MD  hydrochlorothiazide (HYDRODIURIL) 25 MG tablet Take 1 tablet (25 mg total) by mouth daily. 06/28/16 05/14/17 Yes Alpha Mysliwiec, Eilleen Kempf, MD  lisinopril (PRINIVIL,ZESTRIL) 20 MG tablet Take 1 tablet (20 mg total) by mouth daily. 06/28/16  Yes Maecy Podgurski, Eilleen Kempf, MD  omeprazole (PRILOSEC) 10 MG capsule Take 10 mg by mouth daily.   Yes [provider]  butalbital-acetaminophen-caffeine (FIORICET, ESGIC) 50-325-40 MG tablet Take 1 tablet by mouth every 6 (six) hours as needed for headache. Patient not taking: Reported on 05/14/2017 10/22/16 10/22/17  Georgina Quint, MD  meloxicam (MOBIC) 7.5 MG tablet Take 1 tablet (7.5 mg total) by mouth daily. Patient not taking: Reported on 08/16/2016 06/15/16   Benjiman Core D, PA-C    Allergies  Allergen Reactions  . Penicillins     Patient Active Problem List   Diagnosis Date Noted  . Primary localized osteoarthritis of knees, bilateral 04/23/2017  . Chronic tension-type headache, intractable 10/22/2016  . Breast cancer screening 10/22/2016  . Colon cancer screening 10/22/2016    . Impacted cerumen of right ear 08/16/2016  . Otalgia of right ear 08/16/2016  . Essential hypertension 06/28/2016  . Facial rash 06/28/2016  . Heart murmur 06/28/2016    Past Medical History:  Diagnosis Date  . Allergy   . Hypertension     Past Surgical History:  Procedure Laterality Date  . CHOLECYSTECTOMY    . HERNIA REPAIR      Social History   Socioeconomic History  . Marital status: Married    Spouse name: Not on file  . Number of children: Not on file  . Years of education: Not on file  . Highest education level: Not on file  Social Needs  . Financial resource strain: Not on file  . Food insecurity - worry: Not on file  . Food insecurity - inability: Not on file  . Transportation needs - medical: Not on file  . Transportation needs - non-medical: Not on file  Occupational History  . Not on file  Tobacco Use  . Smoking status: Never Smoker  . Smokeless tobacco: Never Used  Substance and Sexual Activity  . Alcohol use: No    Alcohol/week: 0.0 oz  . Drug use: No  . Sexual activity: No  Other Topics Concern  . Not on file  Social History Narrative  . Not on file    No family history on file.   Review of Systems  Constitutional: Negative.  Negative for chills, fever and weight loss.  HENT: Negative.  Negative for congestion, hearing loss,  nosebleeds and sore throat.   Eyes: Negative.  Negative for blurred vision and double vision.  Respiratory: Negative.  Negative for cough and shortness of breath.   Cardiovascular: Negative.  Negative for chest pain and palpitations.  Gastrointestinal: Negative.  Negative for abdominal pain, diarrhea, heartburn, nausea and vomiting.  Genitourinary: Negative.  Negative for dysuria and hematuria.  Musculoskeletal: Negative.  Negative for back pain, myalgias and neck pain.  Skin: Negative.  Negative for rash.  Neurological: Positive for headaches. Negative for dizziness, sensory change and focal weakness.   Endo/Heme/Allergies: Negative.   All other systems reviewed and are negative.  Vitals:   05/14/17 1510  BP: (!) 161/87  Pulse: 85  Resp: 17  Temp: 99 F (37.2 C)  SpO2: 98%     Physical Exam  Constitutional: She is oriented to person, place, and time. She appears well-developed and well-nourished.  HENT:  Head: Normocephalic and atraumatic.  Nose: Nose normal.  Mouth/Throat: Oropharynx is clear and moist.  Eyes: Conjunctivae and EOM are normal. Pupils are equal, round, and reactive to light.  Neck: Normal range of motion. Neck supple. No JVD present. No thyromegaly present.  Cardiovascular: Normal rate, regular rhythm and normal heart sounds.  Pulmonary/Chest: Effort normal and breath sounds normal.  Abdominal: Soft. Bowel sounds are normal. She exhibits no distension. There is no tenderness.  Musculoskeletal: Normal range of motion. She exhibits no edema.  Lymphadenopathy:    She has no cervical adenopathy.  Neurological: She is alert and oriented to person, place, and time. No sensory deficit. She exhibits normal muscle tone.  Skin: Skin is warm and dry. Capillary refill takes less than 2 seconds. No rash noted.  Psychiatric: She has a normal mood and affect. Her behavior is normal.  Vitals reviewed.  Essential hypertension Blood pressure of target today due to medication noncompliance.  Advised to stop hydrochlorothiazide.  We will continue amlodipine.  We will increase the dose of lisinopril to 40 mg a day.  Labs done today.  Will check renal function and electrolytes.  Advised to monitor blood pressure at home.  Follow-up in 3 months.    ASSESSMENT & PLAN: Kayla Moody was seen today for follow-up.  Diagnoses and all orders for this visit:  Essential hypertension -     CBC with Differential/Platelet -     Comprehensive metabolic panel -     Hemoglobin A1c -     HIV antibody -     amLODipine (NORVASC) 10 MG tablet; Take 1 tablet (10 mg total) by mouth daily. -      lisinopril (PRINIVIL,ZESTRIL) 40 MG tablet; Take 1 tablet (40 mg total) by mouth daily.  Screening for colon cancer -     Ambulatory referral to Gastroenterology  Screening for breast cancer -     MM Digital Screening; Future     Patient Instructions       IF you received an x-ray today, you will receive an invoice from [facility]. Please contact [facility] at [phone] with questions or concerns regarding your invoice.   IF you received labwork today, you will receive an invoice from [facility]. Please contact [facility] at [phone] with questions or concerns regarding your invoice.   Our billing staff will not be able to assist you with questions regarding bills from these companies.  You will be contacted with the lab results as soon as they are available. The fastest way to get your results is to activate your My Chart account. Instructions are located on  the last page of this paperwork. If you have not heard from [place] regarding the results in 2 weeks, please contact this office.      Hypertension Hypertension, commonly called high blood pressure, is when the force of blood pumping through the arteries is too strong. The arteries are the blood vessels that carry blood from the heart throughout the body. Hypertension forces the heart to work harder to pump blood and may cause arteries to become narrow or stiff. Having untreated or uncontrolled hypertension can cause heart attacks, strokes, kidney disease, and other problems. A blood pressure reading consists of a higher number over a lower number. Ideally, your blood pressure should be below 120/80. The first ("top") number is called the systolic pressure. It is a measure of the pressure in your arteries as your heart beats. The second ("bottom") number is called the diastolic pressure. It is a measure of the pressure in your arteries as the heart relaxes. What are the causes? The cause of this condition is not  known. What increases the risk? Some risk factors for high blood pressure are under your control. Others are not. Factors you can change  Smoking.  Having type 2 diabetes mellitus, high cholesterol, or both.  Not getting enough exercise or physical activity.  Being overweight.  Having too much fat, sugar, calories, or salt (sodium) in your diet.  Drinking too much alcohol. Factors that are difficult or impossible to change  Having chronic kidney disease.  Having a family history of high blood pressure.  Age. Risk increases with age.  Race. You may be at higher risk if you are African-American.  Gender. Men are at higher risk than women before age 46. After age 76, women are at higher risk than men.  Having obstructive sleep apnea.  Stress. What are the signs or symptoms? Extremely high blood pressure (hypertensive crisis) may cause:  Headache.  Anxiety.  Shortness of breath.  Nosebleed.  Nausea and vomiting.  Severe chest pain.  Jerky movements you cannot control (seizures).  How is this diagnosed? This condition is diagnosed by measuring your blood pressure while you are seated, with your arm resting on a surface. The cuff of the blood pressure monitor will be placed directly against the skin of your upper arm at the level of your heart. It should be measured at least twice using the same arm. Certain conditions can cause a difference in blood pressure between your right and left arms. Certain factors can cause blood pressure readings to be lower or higher than normal (elevated) for a short period of time:  When your blood pressure is higher when you are in a health care provider's office than when you are at home, this is called white coat hypertension. Most people with this condition do not need medicines.  When your blood pressure is higher at home than when you are in a health care provider's office, this is called masked hypertension. Most people with this  condition may need medicines to control blood pressure.  If you have a high blood pressure reading during one visit or you have normal blood pressure with other risk factors:  You may be asked to return on a different day to have your blood pressure checked again.  You may be asked to monitor your blood pressure at home for 1 week or longer.  If you are diagnosed with hypertension, you may have other blood or imaging tests to help your health care provider understand your overall risk  for other conditions. How is this treated? This condition is treated by making healthy lifestyle changes, such as eating healthy foods, exercising more, and reducing your alcohol intake. Your health care provider may prescribe medicine if lifestyle changes are not enough to get your blood pressure under control, and if:  Your systolic blood pressure is above 130.  Your diastolic blood pressure is above 80.  Your personal target blood pressure may vary depending on your medical conditions, your age, and other factors. Follow these instructions at home: Eating and drinking  Eat a diet that is high in fiber and potassium, and low in sodium, added sugar, and fat. An example eating plan is called the DASH (Dietary Approaches to Stop Hypertension) diet. To eat this way: ? Eat plenty of fresh fruits and vegetables. Try to fill half of your plate at each meal with fruits and vegetables. ? Eat whole grains, such as whole wheat pasta, brown rice, or whole grain bread. Fill about one quarter of your plate with whole grains. ? Eat or drink low-fat dairy products, such as skim milk or low-fat yogurt. ? Avoid fatty cuts of meat, processed or cured meats, and poultry with skin. Fill about one quarter of your plate with lean proteins, such as fish, chicken without skin, beans, eggs, and tofu. ? Avoid premade and processed foods. These tend to be higher in sodium, added sugar, and fat.  Reduce your daily sodium intake. Most  people with hypertension should eat less than 1,500 mg of sodium a day.  Limit alcohol intake to no more than 1 drink a day for nonpregnant women and 2 drinks a day for men. One drink equals 12 oz of beer, 5 oz of wine, or 1 oz of hard liquor. Lifestyle  Work with your health care provider to maintain a healthy body weight or to lose weight. Ask what an ideal weight is for you.  Get at least 30 minutes of exercise that causes your heart to beat faster (aerobic exercise) most days of the week. Activities may include walking, swimming, or biking.  Include exercise to strengthen your muscles (resistance exercise), such as pilates or lifting weights, as part of your weekly exercise routine. Try to do these types of exercises for 30 minutes at least 3 days a week.  Do not use any products that contain nicotine or tobacco, such as cigarettes and e-cigarettes. If you need help quitting, ask your health care provider.  Monitor your blood pressure at home as told by your health care provider.  Keep all follow-up visits as told by your health care provider. This is important. Medicines  Take over-the-counter and prescription medicines only as told by your health care provider. Follow directions carefully. Blood pressure medicines must be taken as prescribed.  Do not skip doses of blood pressure medicine. Doing this puts you at risk for problems and can make the medicine less effective.  Ask your health care provider about side effects or reactions to medicines that you should watch for. Contact a health care provider if:  You think you are having a reaction to a medicine you are taking.  You have headaches that keep coming back (recurring).  You feel dizzy.  You have swelling in your ankles.  You have trouble with your vision. Get help right away if:  You develop a severe headache or confusion.  You have unusual weakness or numbness.  You feel faint.  You have severe pain in your  chest or abdomen.  You vomit repeatedly.  You have trouble breathing. Summary  Hypertension is when the force of blood pumping through your arteries is too strong. If this condition is not controlled, it may put you at risk for serious complications.  Your personal target blood pressure may vary depending on your medical conditions, your age, and other factors. For most people, a normal blood pressure is less than 120/80.  Hypertension is treated with lifestyle changes, medicines, or a combination of both. Lifestyle changes include weight loss, eating a healthy, low-sodium diet, exercising more, and limiting alcohol. This information is not intended to replace advice given to you by your health care provider. Make sure you discuss any questions you have with your health care provider. Document Released: 02/26/2005 Document Revised: 01/25/2016 Document Reviewed: 01/25/2016 Elsevier Interactive Patient Education  2018 Elsevier Inc.    Edwina Barth, MD [facility]

## 2017-05-15 ENCOUNTER — Encounter: Payer: Self-pay | Admitting: Radiology

## 2017-05-15 LAB — COMPREHENSIVE METABOLIC PANEL
A/G RATIO: 1.5 (ref 1.2–2.2)
ALBUMIN: 4.4 g/dL (ref 3.5–5.5)
ALT: 16 IU/L (ref 0–32)
AST: 14 IU/L (ref 0–40)
Alkaline Phosphatase: 82 IU/L (ref 39–117)
BUN/Creatinine Ratio: 19 (ref 9–23)
BUN: 11 mg/dL (ref 6–24)
Bilirubin Total: 0.2 mg/dL (ref 0.0–1.2)
CALCIUM: 9.5 mg/dL (ref 8.7–10.2)
CO2: 24 mmol/L (ref 20–29)
Chloride: 102 mmol/L (ref 96–106)
Creatinine, Ser: 0.59 mg/dL (ref 0.57–1.00)
GFR, EST AFRICAN AMERICAN: 123 mL/min/{1.73_m2} (ref >59)
GFR, EST NON AFRICAN AMERICAN: 106 mL/min/{1.73_m2} (ref >59)
GLOBULIN, TOTAL: 2.9 g/dL (ref 1.5–4.5)
Glucose: 120 mg/dL — ABNORMAL HIGH (ref 65–99)
POTASSIUM: 3.8 mmol/L (ref 3.5–5.2)
SODIUM: 143 mmol/L (ref 134–144)
TOTAL PROTEIN: 7.3 g/dL (ref 6.0–8.5)

## 2017-05-15 LAB — CBC WITH DIFFERENTIAL/PLATELET
Basophils Absolute: 0 10*3/uL (ref 0.0–0.2)
Basos: 0 %
EOS (ABSOLUTE): 0.1 10*3/uL (ref 0.0–0.4)
EOS: 2 %
HEMATOCRIT: 39.9 % (ref 34.0–46.6)
Hemoglobin: 13.5 g/dL (ref 11.1–15.9)
IMMATURE GRANS (ABS): 0 10*3/uL (ref 0.0–0.1)
IMMATURE GRANULOCYTES: 0 %
LYMPHS: 38 %
Lymphocytes Absolute: 2.9 10*3/uL (ref 0.7–3.1)
MCH: 29.9 pg (ref 26.6–33.0)
MCHC: 33.8 g/dL (ref 31.5–35.7)
MCV: 89 fL (ref 79–97)
Monocytes Absolute: 0.5 10*3/uL (ref 0.1–0.9)
Monocytes: 7 %
Neutrophils Absolute: 4 10*3/uL (ref 1.4–7.0)
Neutrophils: 53 %
Platelets: 379 10*3/uL (ref 150–379)
RBC: 4.51 x10E6/uL (ref 3.77–5.28)
RDW: 13 % (ref 12.3–15.4)
WBC: 7.7 10*3/uL (ref 3.4–10.8)

## 2017-05-15 LAB — HEMOGLOBIN A1C
Est. average glucose Bld gHb Est-mCnc: 131 mg/dL
Hgb A1c MFr Bld: 6.2 % — ABNORMAL HIGH (ref 4.8–5.6)

## 2017-05-15 LAB — HIV ANTIBODY (ROUTINE TESTING W REFLEX): HIV SCREEN 4TH GENERATION: NONREACTIVE

## 2017-05-23 NOTE — Telephone Encounter (Signed)
Pt called and would like to know If her insurance approved her Gel shot

## 2017-05-23 NOTE — Telephone Encounter (Signed)
Please submit application for Gel injections Synvisc DR Roda [employer]ters[employer]

## 2017-05-24 NOTE — Telephone Encounter (Signed)
Submitted application online for SynviscOne, bilateral knee. 

## 2017-05-27 ENCOUNTER — Encounter: Payer: Self-pay | Admitting: Gastroenterology

## 2017-05-27 NOTE — Telephone Encounter (Signed)
Talked with patient and advised her that verification of benefits has been received and authorization is needed for  Monovisc Injection.  Once approved, she will get a phone call to schedule appt.for gel injection.

## 2017-05-27 NOTE — Telephone Encounter (Signed)
Pt called to check on her Synvisc Shot. I did verify with pt that her application was submitted.

## 2017-06-07 ENCOUNTER — Telehealth (INDEPENDENT_AMBULATORY_CARE_PROVIDER_SITE_OTHER): Payer: Self-pay

## 2017-06-07 NOTE — Telephone Encounter (Signed)
Faxed completed PA form to [facility] at [phone] for SynviscOne, bilateral knee.

## 2017-06-11 ENCOUNTER — Telehealth (INDEPENDENT_AMBULATORY_CARE_PROVIDER_SITE_OTHER): Payer: Self-pay | Admitting: Orthopaedic Surgery

## 2017-06-11 NOTE — Telephone Encounter (Signed)
[employer]  [phone]  Fax [phone]    Authorization request  Kayla Moody  Synvisc injection request    Additional information needed   Page1 initial treatment criteria

## 2017-06-11 NOTE — Telephone Encounter (Signed)
What is she needing?   Stepheney Peele no longer works here.

## 2017-06-14 ENCOUNTER — Telehealth (INDEPENDENT_AMBULATORY_CARE_PROVIDER_SITE_OTHER): Payer: Self-pay

## 2017-06-14 NOTE — Telephone Encounter (Signed)
Patient called concerning Gel injection for bilateral knee.  Advised patient that [facility]17 and currently we are waiting to receive information from [facility].  Advised patient that she will receive a call once approved.

## 2017-08-02 ENCOUNTER — Encounter: Payer: Self-pay | Admitting: Gastroenterology

## 2017-08-14 ENCOUNTER — Telehealth (INDEPENDENT_AMBULATORY_CARE_PROVIDER_SITE_OTHER): Payer: Self-pay

## 2017-08-14 IMAGING — DX DG KNEE COMPLETE 4+V*L*
4 series · 4 of 4 positions shown · non-contrast
Comparison: None.

CLINICAL DATA: Left knee pain for 2 days.

EXAM:
LEFT KNEE - COMPLETE 4+ VIEW

[knee ap]
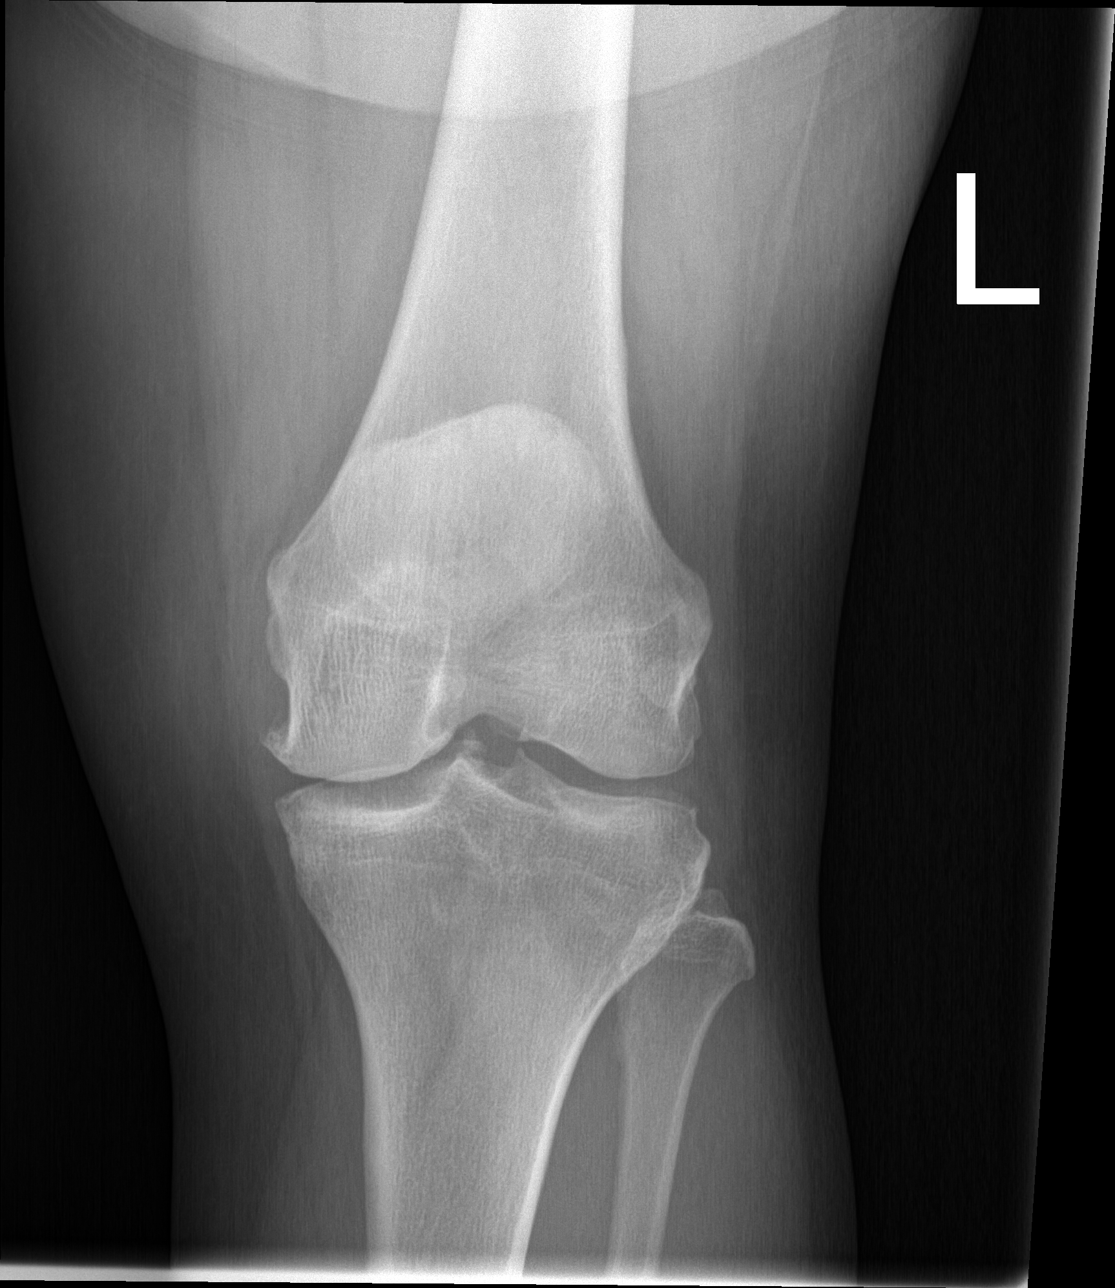

[knee lat]
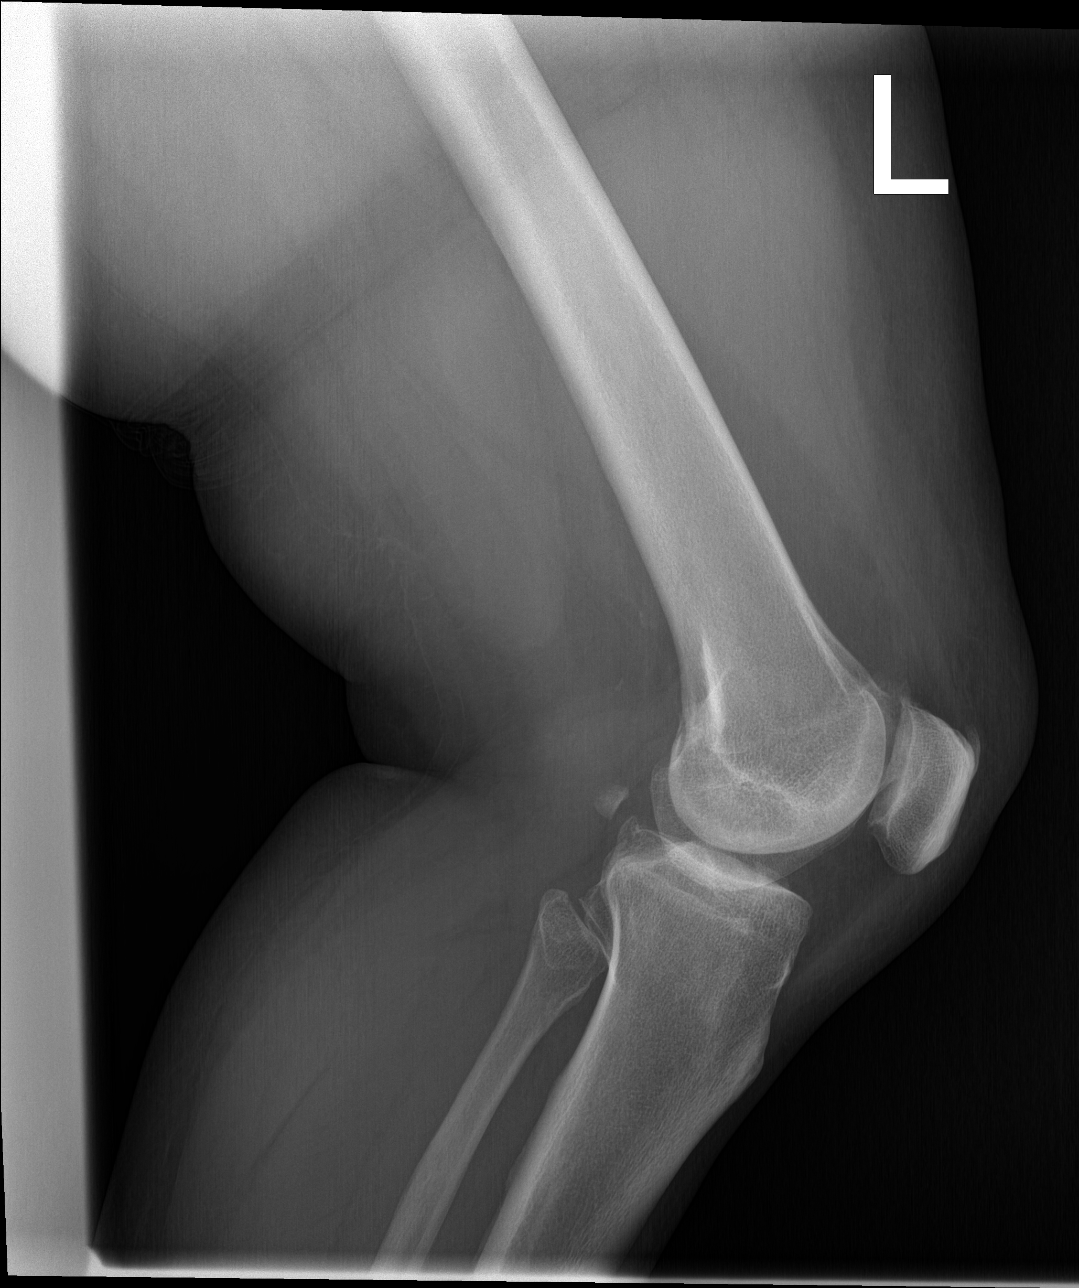

[sunrise]
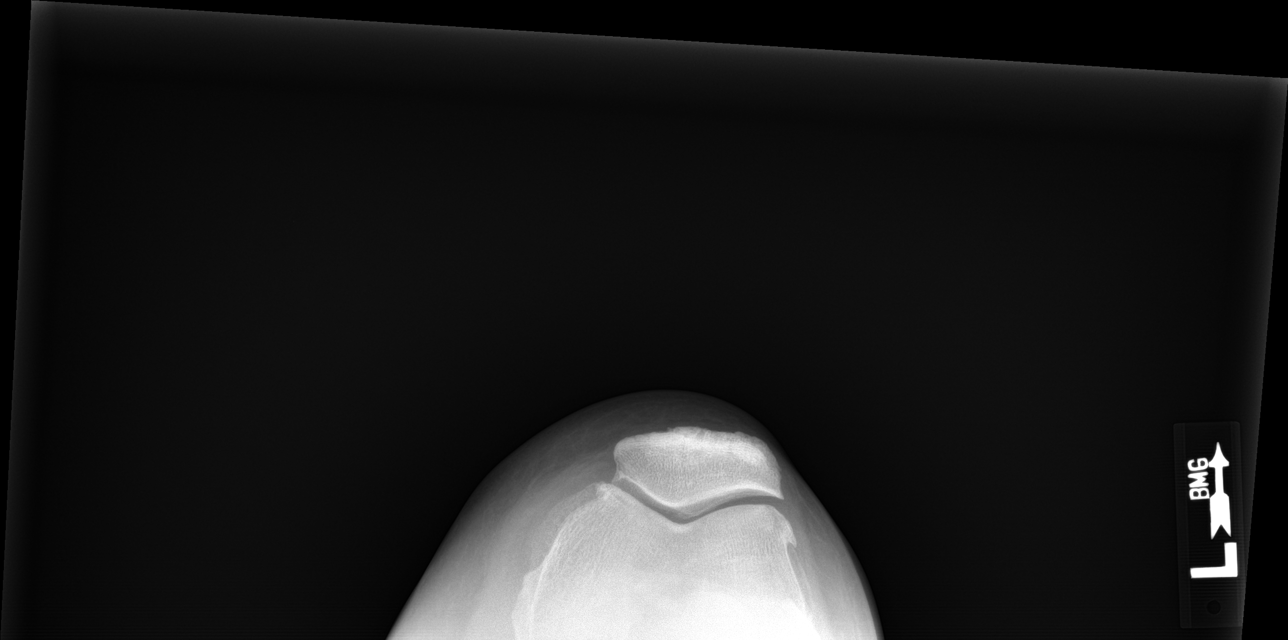

[knee [person_name]]
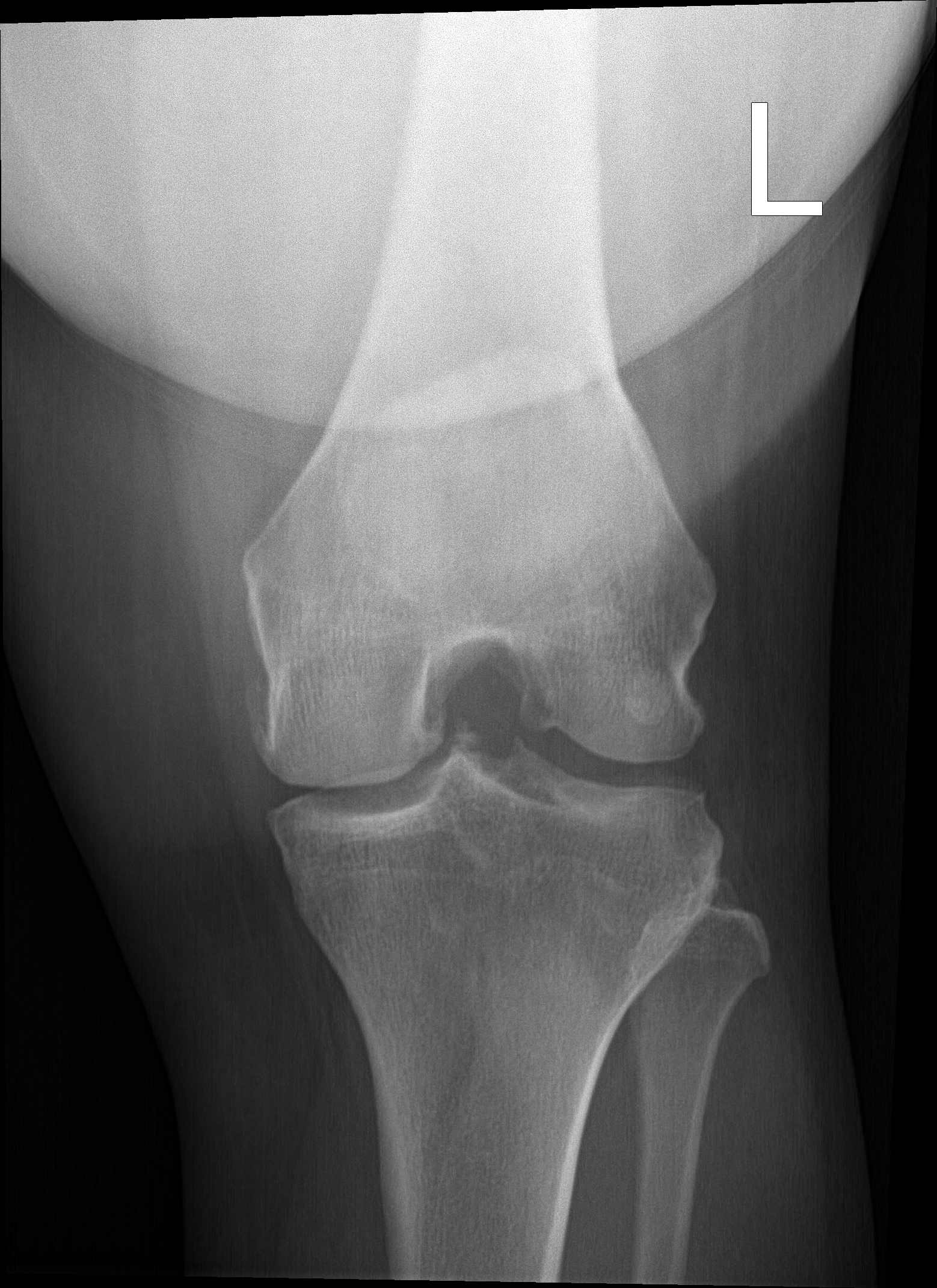

[4 of 4 positions shown; findings below may reference images not displayed]

FINDINGS: No evidence of fracture, dislocation, or joint effusion. Three
compartment osteoarthritic changes with joint space narrowing,
subchondral sclerosis and osteophyte formation, moderate.
IMPRESSION: Three compartment osteoarthritic changes of the left knee, moderate.

## 2017-08-14 NOTE — Telephone Encounter (Signed)
Faxed office notes to [employer] at [phone].

## 2017-08-15 ENCOUNTER — Telehealth (INDEPENDENT_AMBULATORY_CARE_PROVIDER_SITE_OTHER): Payer: Self-pay | Admitting: Orthopaedic Surgery

## 2017-08-15 ENCOUNTER — Telehealth (INDEPENDENT_AMBULATORY_CARE_PROVIDER_SITE_OTHER): Payer: Self-pay

## 2017-08-15 NOTE — Telephone Encounter (Signed)
Duplicate

## 2017-08-15 NOTE — Telephone Encounter (Signed)
Called and [facility] advising her that I would fax [employer] initial treatment form.

## 2017-08-15 NOTE — Telephone Encounter (Signed)
Kayla Moody with [employer] said she is needing fax #1 (initial treatment page) for the injection. She is asking for a return call # [phone]  Not sure who this should be directed to, Thanks!

## 2017-08-15 NOTE — Telephone Encounter (Signed)
See message below °

## 2017-08-15 NOTE — Telephone Encounter (Signed)
Faxed initial treatment form to [employer] at [phone].

## 2017-08-16 ENCOUNTER — Ambulatory Visit: Payer: BLUE CROSS/BLUE SHIELD | Admitting: Emergency Medicine

## 2017-08-16 NOTE — Telephone Encounter (Signed)
Kayla Moody with [employer] again called stating Page 1 with the forms is missing which is the initial criteria/initial treatment page. Please fax Monday if possible.

## 2017-08-19 ENCOUNTER — Telehealth (INDEPENDENT_AMBULATORY_CARE_PROVIDER_SITE_OTHER): Payer: Self-pay

## 2017-08-19 NOTE — Telephone Encounter (Signed)
Called and left VM advising Marelise with [employer] to return my call concerning patient's authorization form page 1.

## 2017-08-19 NOTE — Telephone Encounter (Signed)
Marelise with [employer] returned my call stating that she did get my message and that she would be faxing page 1 of authorization form.

## 2017-08-20 ENCOUNTER — Encounter: Payer: Self-pay | Admitting: Gastroenterology

## 2017-09-10 ENCOUNTER — Telehealth (INDEPENDENT_AMBULATORY_CARE_PROVIDER_SITE_OTHER): Payer: Self-pay

## 2017-09-10 NOTE — Telephone Encounter (Signed)
Faxed completed PA forms to [phone].

## 2017-09-11 DIAGNOSIS — G44229 Chronic tension-type headache, not intractable: Secondary | ICD-10-CM | POA: Diagnosis not present

## 2017-09-11 DIAGNOSIS — M62838 Other muscle spasm: Secondary | ICD-10-CM | POA: Diagnosis not present

## 2017-09-11 DIAGNOSIS — I10 Essential (primary) hypertension: Secondary | ICD-10-CM | POA: Diagnosis not present

## 2017-09-11 DIAGNOSIS — M542 Cervicalgia: Secondary | ICD-10-CM | POA: Diagnosis not present

## 2017-10-17 ENCOUNTER — Telehealth: Payer: Self-pay

## 2017-10-17 NOTE — Telephone Encounter (Signed)
Patient No Showed for PV today.  A message was left on voicemail to call and reschedule PV prior to 5:00 Pm today. If patient doesn't reschedule by 5:00 Pm a no show letter will be mailed and the colonoscopy that is scheduled for 8 22/19 will be cancelled per [facility] guidelines.   Kayla DaneNancy Jaclynn Laumann, LPN ( PV )

## 2017-10-30 NOTE — Telephone Encounter (Signed)
Patient wondering about authorization for gel injections, please check and give patient a call back to update # [phone]

## 2017-10-31 ENCOUNTER — Encounter: Payer: Self-pay | Admitting: Gastroenterology

## 2017-10-31 NOTE — Telephone Encounter (Signed)
Called and left a VM advising patient to return my call to discuss the next step for the gel injection.

## 2017-11-01 ENCOUNTER — Telehealth (INDEPENDENT_AMBULATORY_CARE_PROVIDER_SITE_OTHER): Payer: Self-pay

## 2017-11-01 NOTE — Telephone Encounter (Signed)
Had spoken with Morrie Sheldonshley L.on Wednesday, 10/30/2017 concerning patient's appeal to have gel injection.  Morrie Sheldonshley stated that patient would have to sign a consent for our office to represent her.  Called and left a VM advising patient of above message, patient came by the office today and signed the consent.  Cons[facility] at [phone]

## 2017-11-18 ENCOUNTER — Encounter: Payer: Self-pay | Admitting: Emergency Medicine

## 2017-11-27 NOTE — Telephone Encounter (Signed)
Patient wondering if you have heard anything back from her insurance? Please advise what she should do? # [phone]

## 2017-11-29 ENCOUNTER — Telehealth (INDEPENDENT_AMBULATORY_CARE_PROVIDER_SITE_OTHER): Payer: Self-pay

## 2017-11-29 NOTE — Telephone Encounter (Signed)
Talked with Annesha N.with [employer] to check status of Appeal for SynviscOne, bilateral knee and she stated that appeal was invalid.  Stated that a fax was received from our office on 11/01/2017, but it did not come through on there end.  Advised Annesha that I never received a call or a fax letting me know that form was not received.  Faxed appeal form again to [phone], attn: Isabelle CourseMegan Young, reference# [id number]  Talked with patient and advised her of message above.  Patient understands.  Will check on appeal on next week.

## 2017-11-29 NOTE — Telephone Encounter (Signed)
Talked with patient concerning appeal for gel injection.  See message in patient's chart.

## 2017-12-02 NOTE — Telephone Encounter (Signed)
Ron, from [employer] called stating that they need a[place]a letter on our letterhead stating that the patient needs the Synvisc medication sent to them via fax #[phone] attn: appeals, with the appeals ID [id number].  Thank you  [phone].

## 2017-12-02 NOTE — Telephone Encounter (Signed)
Noted.  Duplicate. 

## 2017-12-02 NOTE — Telephone Encounter (Signed)
See message below °

## 2017-12-04 NOTE — Telephone Encounter (Signed)
Tried to call Ron with [employer] back concerning what information is needed to be faxed to there office, but the number invalid.  Talked with Annesha with [employer] and she stated that appeal is still invalid, but I receive a call back from an appeals rep.to clarify.

## 2017-12-09 ENCOUNTER — Telehealth (INDEPENDENT_AMBULATORY_CARE_PROVIDER_SITE_OTHER): Payer: Self-pay

## 2017-12-09 NOTE — Telephone Encounter (Signed)
Faxed Appeal form, authorization form, and office notes to [employer] at [phone] with appeal# [id number]/reference# [id number].

## 2017-12-17 ENCOUNTER — Telehealth (INDEPENDENT_AMBULATORY_CARE_PROVIDER_SITE_OTHER): Payer: Self-pay

## 2017-12-17 NOTE — Telephone Encounter (Signed)
Received a fax from [facility] stating that more information was needed for patient's appeal.  Faxed x-ray's, x-ray reports, and office notes to [facility] at [phone].

## 2018-01-23 NOTE — Telephone Encounter (Signed)
Talked with patient and advised her that I called and left a VM for Ernestene MentionMary-Ann Wilkins, Analyst with [employer] to call back with the status of the Appeal. Patient stated that she understands.

## 2018-01-23 NOTE — Telephone Encounter (Signed)
Please call the patient today to advise her of the [facility].  CB#[phone].

## 2018-01-28 ENCOUNTER — Encounter (INDEPENDENT_AMBULATORY_CARE_PROVIDER_SITE_OTHER): Payer: Self-pay | Admitting: Orthopaedic Surgery

## 2018-01-28 ENCOUNTER — Ambulatory Visit (INDEPENDENT_AMBULATORY_CARE_PROVIDER_SITE_OTHER): Payer: Self-pay

## 2018-01-28 ENCOUNTER — Ambulatory Visit (INDEPENDENT_AMBULATORY_CARE_PROVIDER_SITE_OTHER): Payer: BLUE CROSS/BLUE SHIELD | Admitting: Orthopaedic Surgery

## 2018-01-28 DIAGNOSIS — M1712 Unilateral primary osteoarthritis, left knee: Secondary | ICD-10-CM | POA: Diagnosis not present

## 2018-01-28 DIAGNOSIS — M25561 Pain in right knee: Secondary | ICD-10-CM | POA: Diagnosis not present

## 2018-01-28 DIAGNOSIS — G8929 Other chronic pain: Secondary | ICD-10-CM

## 2018-01-28 DIAGNOSIS — M25562 Pain in left knee: Secondary | ICD-10-CM | POA: Diagnosis not present

## 2018-01-28 DIAGNOSIS — M1711 Unilateral primary osteoarthritis, right knee: Secondary | ICD-10-CM | POA: Diagnosis not present

## 2018-01-28 MED ORDER — LIDOCAINE HCL 1 % IJ SOLN
2.0000 mL | INTRAMUSCULAR | Status: AC | PRN
  Administered 2018-01-28: 2 mL

## 2018-01-28 MED ORDER — METHYLPREDNISOLONE ACETATE 40 MG/ML IJ SUSP
40.0000 mg | INTRAMUSCULAR | Status: AC | PRN
  Administered 2018-01-28: 40 mg via INTRA_ARTICULAR

## 2018-01-28 MED ORDER — BUPIVACAINE HCL 0.5 % IJ SOLN
2.0000 mL | INTRAMUSCULAR | Status: AC | PRN
  Administered 2018-01-28: 2 mL via INTRA_ARTICULAR

## 2018-01-28 NOTE — Progress Notes (Signed)
Office Visit Note   Patient: Kayla Moody           Date of Birth: 1965-09-20           MRN: [id number] Visit Date: 01/28/2018              Requested by: No referring provider defined for this encounter. PCP: Patient, No Pcp Per   Assessment & Plan: Visit Diagnoses:  1. Unilateral primary osteoarthritis, left knee   2. Unilateral primary osteoarthritis, right knee   3. Chronic pain of both knees     Plan: Impression is moderately severe bilateral degenerative joint disease of her knees.  Bilateral cortisone injections performed today.  Patient tolerates well.  Her Visco supplementation injection approval is still pending.  Questions encouraged and answered.  Follow-up as needed.  Patient understands the importance of weight loss and how this affects her knee pain.  Follow-Up Instructions: Return if symptoms worsen or fail to improve.   Orders:  Orders Placed This Encounter  Procedures  . XR KNEE 3 VIEW LEFT  . XR KNEE 3 VIEW RIGHT   No orders of the defined types were placed in this encounter.     Procedures: Large Joint Inj: bilateral knee on 01/28/2018 1:55 PM Indications: pain Details: 22 G needle  Arthrogram: No  Medications (Right): 2 mL lidocaine 1 %; 2 mL bupivacaine 0.5 %; 40 mg methylPREDNISolone acetate 40 MG/ML Medications (Left): 2 mL lidocaine 1 %; 2 mL bupivacaine 0.5 %; 40 mg methylPREDNISolone acetate 40 MG/ML Outcome: tolerated well, no immediate complications Patient was prepped and draped in the usual sterile fashion.       Clinical Data: No additional findings.   Subjective: Chief Complaint  Patient presents with  . Right Knee - Pain  . Left Knee - Pain    Kayla Moody follows up today for bilateral knee pain.  I previously saw her in February for this.  We have provided her with cortisone injections.  Her Visco supplementation preauthorization still pending.  She is requesting bilateral knee injections today.  She is still getting good  relief from cortisone injections.   Review of Systems  Constitutional: Negative.   HENT: Negative.   Eyes: Negative.   Respiratory: Negative.   Cardiovascular: Negative.   Endocrine: Negative.   Musculoskeletal: Negative.   Neurological: Negative.   Hematological: Negative.   Psychiatric/Behavioral: Negative.   All other systems reviewed and are negative.    Objective: Vital Signs: There were no vitals taken for this visit.  Physical Exam  Constitutional: She is oriented to person, place, and time. She appears well-developed and well-nourished.  Pulmonary/Chest: Effort normal.  Neurological: She is alert and oriented to person, place, and time.  Skin: Skin is warm. Capillary refill takes less than 2 seconds.  Psychiatric: She has a normal mood and affect. Her behavior is normal. Judgment and thought content normal.  Nursing note and vitals reviewed.   Ortho Exam Bilateral knee exam showed no joint effusion.  Normal range of motion.  Collaterals and cruciates are stable. Specialty Comments:  No specialty comments available.  Imaging: Xr Knee 3 View Left  Result Date: 01/28/2018 Moderately severe degenerative joint disease  Xr Knee 3 View Right  Result Date: 01/28/2018 Moderately severe degenerative joint disease    PMFS History: Patient Active Problem List   Diagnosis Date Noted  . Primary localized osteoarthritis of knees, bilateral 04/23/2017  . Chronic tension-type headache, intractable 10/22/2016  . Screening for breast cancer 10/22/2016  .  Screening for colon cancer 10/22/2016  . Impacted cerumen of right ear 08/16/2016  . Otalgia of right ear 08/16/2016  . Essential hypertension 06/28/2016  . Facial rash 06/28/2016  . Heart murmur 06/28/2016   Past Medical History:  Diagnosis Date  . Allergy   . Hypertension     History reviewed. No pertinent family history.  Past Surgical History:  Procedure Laterality Date  . CHOLECYSTECTOMY    . HERNIA  REPAIR     Social History   Occupational History  . Not on file  Tobacco Use  . Smoking status: Never Smoker  . Smokeless tobacco: Never Used  Substance and Sexual Activity  . Alcohol use: No    Alcohol/week: 0.0 standard drinks  . Drug use: No  . Sexual activity: Never

## 2018-02-20 ENCOUNTER — Telehealth (INDEPENDENT_AMBULATORY_CARE_PROVIDER_SITE_OTHER): Payer: Self-pay

## 2018-02-20 NOTE — Telephone Encounter (Addendum)
Talked with Ernestene MentionMary-Ann Wilkins and was advised that patient's Appeal was approved for SynviscOne, bilateral knee. Authorization#[id number] Valid 12/31/2017-12/31/2018  Approved for SynviscOne, bilateral knee. Buy & Bill Covered at 100% of the allowed amount after the Co-pay Co-pay of $20.00  Called and left a VM for patient to return my call.

## 2018-02-24 ENCOUNTER — Ambulatory Visit: Payer: Self-pay | Admitting: *Deleted

## 2018-02-24 NOTE — Telephone Encounter (Signed)
Pt called with complaints of 02/21/18 she was having headache x 3 days; at urgent care; BP 182/110 and she was given "a pill", and her BP went down to 133/95; she states that when her blood pressure went down her headache went away; this morning she started feeling that way again; she has not checked her BP since leaving urgent care; she was directed to see her PCP; she takes amlodipine and norvasc, and has not missed any doses; there is no availability; [facility] and she states that the pt needs to schedule a new pt appointment to reestablish care,  since the pt cancelled many appointments, and in the interim if she develops symptoms the pt should go to urgent care/ED; information relayed to pt and she verbalizes understanding; pt offered and accepted appointment with Dr Alvy BimlerSagardia. [facility] [address], 03/27/18 at 1320; she verbalized understanding.      Reason for Disposition . [1] Systolic BP  >= 130 OR Diastolic >= 80 AND [2] taking BP medications  Answer Assessment - Initial Assessment Questions 1. BLOOD PRESSURE: "What is the blood pressure?" "Did you take at least two measurements 5 minutes apart?"     182/110 and 133/95 2. ONSET: "When did you take your blood pressure?"     02/21/18 at urgent care 3. HOW: "How did you obtain the blood pressure?" (e.g., visiting nurse, automatic home BP monitor)     Automatic cuff at urgent care 4. HISTORY: "Do you have a history of high blood pressure?"     yes 5. MEDICATIONS: "Are you taking any medications for blood pressure?" "Have you missed any doses recently?"     no 6. OTHER SYMPTOMS: "Do you have any symptoms?" (e.g., headache, chest pain, blurred vision, difficulty breathing, weakness)     Headache, blurred vision 7. PREGNANCY: "Is there any chance you are pregnant?" "When was your last menstrual period?"   No perimenopausal  Protocols used: HIGH BLOOD PRESSURE-A-AH

## 2018-03-06 ENCOUNTER — Ambulatory Visit (INDEPENDENT_AMBULATORY_CARE_PROVIDER_SITE_OTHER): Payer: BLUE CROSS/BLUE SHIELD | Admitting: Orthopaedic Surgery

## 2018-03-06 ENCOUNTER — Encounter (INDEPENDENT_AMBULATORY_CARE_PROVIDER_SITE_OTHER): Payer: Self-pay | Admitting: Orthopaedic Surgery

## 2018-03-06 DIAGNOSIS — M170 Bilateral primary osteoarthritis of knee: Secondary | ICD-10-CM | POA: Diagnosis not present

## 2018-03-06 MED ORDER — HYLAN G-F 20 48 MG/6ML IX SOSY
48.0000 mg | PREFILLED_SYRINGE | INTRA_ARTICULAR | Status: AC | PRN
  Administered 2018-03-06: 48 mg via INTRA_ARTICULAR

## 2018-03-06 MED ORDER — LIDOCAINE HCL 1 % IJ SOLN
3.0000 mL | INTRAMUSCULAR | Status: AC | PRN
  Administered 2018-03-06: 3 mL

## 2018-03-06 MED ORDER — BUPIVACAINE HCL 0.25 % IJ SOLN
0.6600 mL | INTRAMUSCULAR | Status: AC | PRN
  Administered 2018-03-06: .66 mL via INTRA_ARTICULAR

## 2018-03-06 NOTE — Progress Notes (Signed)
   Procedure Note  Patient: Kayla Moody             Date of Birth: 04-Feb-1966           MRN: [id number]             Visit Date: 03/06/2018  Procedures: Visit Diagnoses: Bilateral primary osteoarthritis of knee - Plan: Large Joint Inj: bilateral knee  Large Joint Inj: bilateral knee on 03/06/2018 10:05 AM Indications: pain Details: 22 G needle, anterolateral approach Medications (Right): 0.66 mL bupivacaine 0.25 %; 3 mL lidocaine 1 %; 48 mg Hylan 48 MG/6ML Medications (Left): 0.66 mL bupivacaine 0.25 %; 3 mL lidocaine 1 %; 48 mg Hylan 48 MG/6ML

## 2018-03-27 ENCOUNTER — Ambulatory Visit: Payer: Self-pay | Admitting: Emergency Medicine

## 2018-05-17 ENCOUNTER — Other Ambulatory Visit: Payer: Self-pay | Admitting: Emergency Medicine

## 2018-05-17 DIAGNOSIS — I10 Essential (primary) hypertension: Secondary | ICD-10-CM

## 2018-05-19 NOTE — Telephone Encounter (Signed)
Requested Prescriptions  Pending Prescriptions Disp Refills  . amLODipine (NORVASC) 10 MG tablet [Pharmacy Med Name: AMLODIPINE BESYLATE 10MG  TABLETS] 42 tablet 0    Sig: TAKE 1 TABLET BY MOUTH DAILY     Cardiovascular:  Calcium Channel Blockers Failed - 05/17/2018  3:32 AM      Failed - Last BP in normal range    BP Readings from Last 1 Encounters:  05/14/17 (!) 161/87         Failed - Valid encounter within last 6 months    Recent Outpatient Visits          1 year ago Essential hypertension   [facility] [place], Eilleen Kempf, MD   1 year ago Chronic tension-type headache, intractable   [facility] at [facility], Eilleen Kempf, MD   1 year ago Impacted cerumen of right ear   [facility] at [place], Eilleen Kempf, MD   1 year ago Essential hypertension   [facility] [place], [place], MD   1 year ago Pain in both knees, unspecified chronicity   [facility] at [facility], Gerald Stabs, PA-C      Future Appointments            In 1 month Sagardia, Eilleen Kempf, MD [facility] [place], [facility]         given courtesy refill of # [phone] with no refills, until office visit 06/24/18

## 2018-06-17 ENCOUNTER — Other Ambulatory Visit: Payer: Self-pay

## 2018-06-17 DIAGNOSIS — Z1389 Encounter for screening for other disorder: Secondary | ICD-10-CM

## 2018-06-17 DIAGNOSIS — Z130 Encounter for screening for diseases of the blood and blood-forming organs and certain disorders involving the immune mechanism: Secondary | ICD-10-CM

## 2018-06-17 DIAGNOSIS — Z1329 Encounter for screening for other suspected endocrine disorder: Secondary | ICD-10-CM

## 2018-06-17 DIAGNOSIS — Z13228 Encounter for screening for other metabolic disorders: Secondary | ICD-10-CM

## 2018-06-17 DIAGNOSIS — Z13220 Encounter for screening for lipoid disorders: Secondary | ICD-10-CM

## 2018-06-24 ENCOUNTER — Encounter: Payer: Self-pay | Admitting: Emergency Medicine

## 2018-06-24 ENCOUNTER — Other Ambulatory Visit: Payer: Self-pay

## 2018-06-24 ENCOUNTER — Telehealth: Payer: Self-pay | Admitting: *Deleted

## 2018-06-24 NOTE — Telephone Encounter (Signed)
Called patient to triage for Web[facility] appointment. Left message in voice to call back to be triaged.

## 2018-11-14 ENCOUNTER — Other Ambulatory Visit: Payer: Self-pay | Admitting: Family Medicine

## 2018-11-14 DIAGNOSIS — Z1231 Encounter for screening mammogram for malignant neoplasm of breast: Secondary | ICD-10-CM

## 2018-11-18 ENCOUNTER — Ambulatory Visit: Payer: Medicaid Other | Admitting: Registered Nurse

## 2018-11-18 ENCOUNTER — Other Ambulatory Visit: Payer: Self-pay

## 2018-11-19 ENCOUNTER — Encounter: Payer: Self-pay | Admitting: Registered Nurse

## 2018-12-17 LAB — HM PAP SMEAR

## 2018-12-17 LAB — RESULTS CONSOLE HPV: CHL HPV: NEGATIVE

## 2019-01-01 ENCOUNTER — Ambulatory Visit: Payer: Self-pay

## 2019-01-18 ENCOUNTER — Encounter (HOSPITAL_COMMUNITY): Payer: Self-pay | Admitting: *Deleted

## 2019-01-18 ENCOUNTER — Other Ambulatory Visit: Payer: Self-pay

## 2019-01-18 ENCOUNTER — Emergency Department (HOSPITAL_COMMUNITY)
Admission: EM | Admit: 2019-01-18 | Discharge: 2019-01-18 | Disposition: A | Discharge status: Home / Self Care | Payer: Medicaid Other | Attending: Emergency Medicine | Admitting: Emergency Medicine

## 2019-01-18 DIAGNOSIS — I10 Essential (primary) hypertension: Secondary | ICD-10-CM | POA: Insufficient documentation

## 2019-01-18 DIAGNOSIS — H1133 Conjunctival hemorrhage, bilateral: Secondary | ICD-10-CM

## 2019-01-18 DIAGNOSIS — Z79899 Other long term (current) drug therapy: Secondary | ICD-10-CM | POA: Insufficient documentation

## 2019-01-18 NOTE — ED Provider Notes (Signed)
[facility]-EMERGENCY DEPT Provider Note   CSN: [id number] Arrival date & time: 01/18/19  6160     History   Chief Complaint Chief Complaint  Patient presents with  . Bil Eye Redness    HPI Kayla Moody is a 53 y.o. female with history of hypertension who presents with bilateral eye redness.  Patient states that several days ago she woke up with redness of the right eye.  Yesterday she woke up and noticed that her left eye was red as well.  She denies any pain or vision changes.  She reports a history of hypertension has been taking her meds as prescribed.  Her blood pressure is typically controlled however she states she ate some pork grinds earlier in the week and since then has had significantly elevated blood pressures.  She denies headache, dizziness, vision changes, unilateral weakness, numbness or tingling.  No chest pain or shortness of breath.  She denies any head or facial trauma.  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     HPI  Past Medical History:  Diagnosis Date  . Allergy   . Hypertension     Patient Active Problem List   Diagnosis Date Noted  . Bilateral primary osteoarthritis of knee 03/06/2018  . Primary localized osteoarthritis of knees, bilateral 04/23/2017  . Chronic tension-type headache, intractable 10/22/2016  . Essential hypertension 06/28/2016  . Heart murmur 06/28/2016    Past Surgical History:  Procedure Laterality Date  . CHOLECYSTECTOMY    . HERNIA REPAIR       OB History   No obstetric history on file.      Home Medications    Prior to Admission medications   Medication Sig Start Date End Date Taking? Authorizing Provider  amLODipine (NORVASC) 10 MG tablet TAKE 1 TABLET BY MOUTH DAILY 05/19/18   Georgina Quint, MD  hydrochlorothiazide (HYDRODIURIL) 25 MG tablet Take 1 tablet (25 mg total) by mouth daily. 06/28/16 05/14/17  Georgina Quint, MD  lisinopril  (PRINIVIL,ZESTRIL) 40 MG tablet Take 1 tablet (40 mg total) by mouth daily. 05/14/17   Georgina Quint, MD  meloxicam (MOBIC) 7.5 MG tablet Take 1 tablet (7.5 mg total) by mouth daily. Patient not taking: Reported on 08/16/2016 06/15/16   Benjiman Core D, PA-C  omeprazole (PRILOSEC) 10 MG capsule Take 10 mg by mouth daily.    [provider]    Family History No family history on file.  Social History Social History   Tobacco Use  . Smoking status: Never Smoker  . Smokeless tobacco: Never Used  Substance Use Topics  . Alcohol use: No    Alcohol/week: 0.0 standard drinks  . Drug use: No     Allergies   Penicillins   Review of Systems Review of Systems  Eyes: Positive for redness. Negative for pain and visual disturbance.  Cardiovascular: Negative for chest pain.  Neurological: Negative for headaches.     Physical Exam Updated Vital Signs BP (!) 181/96 (BP Location: Right Arm)   Pulse 77   Temp 98.8 F (37.1 C) (Oral)   Resp 16   Ht 5\' 4"  (1.626 m)   Wt 104.3 kg   SpO2 100%   BMI 39.48 kg/m   Physical Exam Vitals signs and nursing note reviewed.  Constitutional:      General: She is not in acute distress.    Appearance: Normal appearance. She is well-developed. She is not ill-appearing.  HENT:  Head: Normocephalic and atraumatic.  Eyes:     General: No scleral icterus.       Right eye: No discharge.        Left eye: No discharge.     Extraocular Movements: Extraocular movements intact.     Conjunctiva/sclera:     Right eye: Hemorrhage (Small subconjunctival hemorrhage over the lateral aspect of the sclera) present.     Left eye: Hemorrhage (Moderate subconjunctival hemorrhage over the lateral aspect of the sclera) present.     Pupils: Pupils are equal, round, and reactive to light.  Neck:     Musculoskeletal: Normal range of motion.  Cardiovascular:     Rate and Rhythm: Normal rate.  Pulmonary:     Effort: Pulmonary effort is normal.  No respiratory distress.  Abdominal:     General: There is no distension.  Skin:    General: Skin is warm and dry.  Neurological:     Mental Status: She is alert and oriented to person, place, and time.  Psychiatric:        Behavior: Behavior normal.      ED Treatments / Results  Labs (all labs ordered are listed, but only abnormal results are displayed) Labs Reviewed - No data to display  EKG None  Radiology No results found.  Procedures Procedures (including critical care time)  Medications Ordered in ED Medications - No data to display   Initial Impression / Assessment and Plan / ED Course  I have reviewed the triage vital signs and the nursing notes.  Pertinent labs & imaging results that were available during my care of the patient were reviewed by me and considered in my medical decision making (see chart for details).  53 year old female presents with bilateral eye redness due to bilateral subconjunctival hemorrhages.  Her vision is intact.  Blood pressure is elevated here -it is 181/96.  She states that she is been having elevated blood pressure several days after eating pork grinds.  She has no severe headache, dizziness, other neurologic signs or chest pain suggest hypertensive emergency.  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  She was given reassurance that this is a benign condition and that she should monitor her blood pressure closely and avoid salt  Final Clinical Impressions(s) / ED Diagnoses   Final diagnoses:  Subconjunctival hemorrhage of both eyes  Hypertension, unspecified type    ED Discharge Orders    None       Recardo Evangelist, PA-C 01/18/19 0840    Lennice Sites, DO 01/18/19 1243

## 2019-01-18 NOTE — ED Triage Notes (Signed)
Pt states she woke Wednesday with redness to rt eye, yesterday it was in both eyes. No pain. No drainage noted

## 2019-01-18 NOTE — Discharge Instructions (Signed)
Please continue your blood pressure medicine Drink plenty of fluids and avoid very salty foods Have you blood pressure rechecked in the next week to make sure it's improving Follow up with your doctor if you continue to have very elevated blood pressure Return to the ED if you have a severe headache, dizziness, chest pain, or confusion

## 2019-02-16 ENCOUNTER — Other Ambulatory Visit: Payer: Self-pay

## 2019-02-16 ENCOUNTER — Ambulatory Visit
Admission: RE | Admit: 2019-02-16 | Discharge: 2019-02-16 | Disposition: A | Discharge status: Home / Self Care | Payer: 59 | Source: Ambulatory Visit | Attending: Family Medicine | Admitting: Family Medicine

## 2019-02-16 DIAGNOSIS — Z1231 Encounter for screening mammogram for malignant neoplasm of breast: Secondary | ICD-10-CM

## 2019-02-16 LAB — HM MAMMOGRAPHY

## 2019-03-02 ENCOUNTER — Other Ambulatory Visit: Payer: Self-pay | Admitting: Family Medicine

## 2019-03-02 DIAGNOSIS — R928 Other abnormal and inconclusive findings on diagnostic imaging of breast: Secondary | ICD-10-CM

## 2019-03-18 ENCOUNTER — Ambulatory Visit
Admission: RE | Admit: 2019-03-18 | Discharge: 2019-03-18 | Disposition: A | Discharge status: Home / Self Care | Payer: 59 | Source: Ambulatory Visit | Attending: Family Medicine | Admitting: Family Medicine

## 2019-03-18 ENCOUNTER — Other Ambulatory Visit: Payer: Self-pay

## 2019-03-18 DIAGNOSIS — R928 Other abnormal and inconclusive findings on diagnostic imaging of breast: Secondary | ICD-10-CM

## 2019-03-26 ENCOUNTER — Encounter: Payer: Self-pay | Admitting: Orthopaedic Surgery

## 2019-03-26 ENCOUNTER — Ambulatory Visit (INDEPENDENT_AMBULATORY_CARE_PROVIDER_SITE_OTHER): Payer: 59 | Admitting: Orthopaedic Surgery

## 2019-03-26 ENCOUNTER — Other Ambulatory Visit: Payer: Self-pay

## 2019-03-26 ENCOUNTER — Telehealth: Payer: Self-pay

## 2019-03-26 DIAGNOSIS — M170 Bilateral primary osteoarthritis of knee: Secondary | ICD-10-CM | POA: Diagnosis not present

## 2019-03-26 MED ORDER — BUPIVACAINE HCL 0.25 % IJ SOLN
0.6600 mL | INTRAMUSCULAR | Status: AC | PRN
  Administered 2019-03-26: 10:00:00 .66 mL via INTRA_ARTICULAR

## 2019-03-26 MED ORDER — METHYLPREDNISOLONE ACETATE 40 MG/ML IJ SUSP
13.3300 mg | INTRAMUSCULAR | Status: AC | PRN
  Administered 2019-03-26: 10:00:00 13.33 mg via INTRA_ARTICULAR

## 2019-03-26 MED ORDER — METHYLPREDNISOLONE ACETATE 40 MG/ML IJ SUSP
13.3300 mg | INTRAMUSCULAR | Status: AC | PRN
  Administered 2019-03-26: 13.33 mg via INTRA_ARTICULAR

## 2019-03-26 MED ORDER — LIDOCAINE HCL 1 % IJ SOLN
3.0000 mL | INTRAMUSCULAR | Status: AC | PRN
  Administered 2019-03-26: 3 mL

## 2019-03-26 MED ORDER — BUPIVACAINE HCL 0.25 % IJ SOLN
0.6600 mL | INTRAMUSCULAR | Status: AC | PRN
  Administered 2019-03-26: .66 mL via INTRA_ARTICULAR

## 2019-03-26 NOTE — Telephone Encounter (Signed)
Please submit for Gel inj Bil knee- Dr. Roda Shutters.

## 2019-03-26 NOTE — Progress Notes (Signed)
   Office Visit Note   Patient: Kayla Moody           Date of Birth: 1965/12/26           MRN: [id number] Visit Date: 03/26/2019              Requested by: No referring provider defined for this encounter. PCP: Patient, No Pcp Per   Assessment & Plan: Visit Diagnoses:  1. Bilateral primary osteoarthritis of knee     Plan: Impression is end-stage generative joint disease both knees.  We injected both knees with cortisone today.  We will submit for approval for bilateral viscosupplementation injections.  She will follow-up with [place] once she has been approved.  Call with concerns or questions in the meantime.  Follow-Up Instructions: Return for once approved for bilateral knee visco injection.   Orders:  Orders Placed This Encounter  Procedures  . Large Joint Inj: bilateral knee   No orders of the defined types were placed in this encounter.     Procedures: Large Joint Inj: bilateral knee on 03/26/2019 10:23 AM Indications: pain Details: 22 G needle, anterolateral approach Medications (Right): 0.66 mL bupivacaine 0.25 %; 3 mL lidocaine 1 %; 13.33 mg methylPREDNISolone acetate 40 MG/ML Medications (Left): 0.66 mL bupivacaine 0.25 %; 3 mL lidocaine 1 %; 13.33 mg methylPREDNISolone acetate 40 MG/ML      Clinical Data: No additional findings.   Subjective: Chief Complaint  Patient presents with  . Left Knee - Pain  . Right Knee - Pain    HPI patient is a pleasant 54 year old female who comes in today with recurrent bilateral knee pain.  History of end-stage generative joint disease both knees.  She was seen by [place] last November where she had bilateral cortisone injections followed by bilateral viscosupplementation injections in December 2019.  Her pain returned a few months ago and has progressively worsened.  She feels as a deep ache within the entire knee.  Pain is aggravated with walking as well as going from a seated to standing position.  Review of Systems as  detailed in HPI.  All others reviewed and are negative.   Objective: Vital Signs: There were no vitals taken for this visit.  Physical Exam well-developed well-nourished female in no acute distress.  Alert and oriented x3.  Ortho Exam examination of both knees reveals no effusion.  Range of motion 0 to 115 degrees.  Medial joint line tenderness.  Ligaments are stable.  She is neurovascular intact distally.  Specialty Comments:  No specialty comments available.  Imaging: No new imaging   PMFS History: Patient Active Problem List   Diagnosis Date Noted  . Bilateral primary osteoarthritis of knee 03/06/2018  . Primary localized osteoarthritis of knees, bilateral 04/23/2017  . Chronic tension-type headache, intractable 10/22/2016  . Essential hypertension 06/28/2016  . Heart murmur 06/28/2016   Past Medical History:  Diagnosis Date  . Allergy   . Hypertension     History reviewed. No pertinent family history.  Past Surgical History:  Procedure Laterality Date  . CHOLECYSTECTOMY    . HERNIA REPAIR     Social History   Occupational History  . Not on file  Tobacco Use  . Smoking status: Never Smoker  . Smokeless tobacco: Never Used  Substance and Sexual Activity  . Alcohol use: No    Alcohol/week: 0.0 standard drinks  . Drug use: No  . Sexual activity: Never

## 2019-03-27 ENCOUNTER — Telehealth: Payer: Self-pay

## 2019-03-27 NOTE — Telephone Encounter (Signed)
Submitted for VOB for Bilateral knee synvisc one injections

## 2019-03-27 NOTE — Telephone Encounter (Signed)
Submit for Verification of Benefits for gel injection.  Thank you 

## 2019-03-30 ENCOUNTER — Telehealth: Payer: Self-pay

## 2019-03-30 NOTE — Telephone Encounter (Signed)
Resubmitted for VOB for Durolane

## 2019-04-01 ENCOUNTER — Telehealth: Payer: Self-pay

## 2019-04-01 ENCOUNTER — Other Ambulatory Visit: Payer: Self-pay | Admitting: Radiology

## 2019-04-01 MED ORDER — DUROLANE 60 MG/3ML IX PRSY: 3.0000 mL | PREFILLED_SYRINGE | Freq: Once | INTRA_ARTICULAR | 0 refills | Status: AC

## 2019-04-01 NOTE — Telephone Encounter (Signed)
VOB completed for this pt. Per insurance visco injection isnt covered under pt's medical benefits but is under pt's pharmacy benefits. The rx  For injection was sent to [facility] @ [phone] as instructed. Pt informed and stated understanding

## 2020-02-08 ENCOUNTER — Telehealth: Payer: Self-pay | Admitting: Orthopaedic Surgery

## 2020-02-08 MED ORDER — DICLOFENAC SODIUM 75 MG PO TBEC: 75.0000 mg | DELAYED_RELEASE_TABLET | Freq: Two times a day (BID) | ORAL | 2 refills | Status: DC

## 2020-02-08 MED ORDER — TRAMADOL HCL 50 MG PO TABS: 50.0000 mg | ORAL_TABLET | Freq: Every evening | ORAL | 0 refills | Status: DC | PRN

## 2020-02-08 NOTE — Telephone Encounter (Signed)
Please advise 

## 2020-02-08 NOTE — Telephone Encounter (Signed)
Pt called about bil knee pain.  She was wondering if she can have a Rx sent in for her pain till she can come into the office in January when her insurance kicks in.  Pt stats she is using tylenol but it isn't helping anymore.   Pt  Works at [employer] and she is on her feet constantly she uses ice and elevates but she states after work she is in extreme pain to the point she is ready to quit her job.

## 2020-02-08 NOTE — Telephone Encounter (Signed)
I sent diclofenac for the day and tramadol if needed at night

## 2020-02-09 ENCOUNTER — Telehealth: Payer: Self-pay | Admitting: Orthopaedic Surgery

## 2020-02-09 NOTE — Telephone Encounter (Signed)
Which medication ?

## 2020-02-09 NOTE — Telephone Encounter (Signed)
Both diclofenac and tramadol please

## 2020-02-09 NOTE — Telephone Encounter (Signed)
Patient called requesting an update on medication being sent to correct pharmacy. Please call patient at [phone].

## 2020-02-09 NOTE — Telephone Encounter (Signed)
I called patient. She would like for this to be sent to [facility] [address]. She no longer uses [facility]. I explained that I would have to resend it and cancel the other. She expressed understanding.

## 2020-02-09 NOTE — Telephone Encounter (Signed)
Can you resend with same previous instructions

## 2020-02-09 NOTE — Telephone Encounter (Signed)
Pt called and would like her medication to be sent to [facility] on [address] in [place]

## 2020-02-09 NOTE — Telephone Encounter (Signed)
Can you please re send

## 2020-02-09 NOTE — Telephone Encounter (Signed)
Please see below. Dr. Roda Shutters sent in medication, however, patient states that she no longer uses that pharmacy. She would like sent to [facility] at [place].

## 2020-02-09 NOTE — Telephone Encounter (Signed)
This was sent to the wrong pharm. Can you send to correct one. Thanks.

## 2020-02-10 ENCOUNTER — Other Ambulatory Visit: Payer: Self-pay | Admitting: Physician Assistant

## 2020-02-10 NOTE — Telephone Encounter (Signed)
Which medication ?

## 2020-02-10 NOTE — Telephone Encounter (Signed)
Tramadol

## 2020-02-11 NOTE — Telephone Encounter (Signed)
Ok, can you send to correct pharmacy?

## 2020-02-12 ENCOUNTER — Other Ambulatory Visit: Payer: Self-pay

## 2020-02-12 MED ORDER — TRAMADOL HCL 50 MG PO TABS: 50.0000 mg | ORAL_TABLET | Freq: Every evening | ORAL | 0 refills | Status: DC | PRN

## 2020-02-12 NOTE — Telephone Encounter (Signed)
Called into pharm.  Called patient to let her know no answer.

## 2020-03-15 ENCOUNTER — Ambulatory Visit (INDEPENDENT_AMBULATORY_CARE_PROVIDER_SITE_OTHER): Payer: 59 | Admitting: Orthopaedic Surgery

## 2020-03-15 ENCOUNTER — Ambulatory Visit (INDEPENDENT_AMBULATORY_CARE_PROVIDER_SITE_OTHER): Payer: 59

## 2020-03-15 ENCOUNTER — Encounter: Payer: Self-pay | Admitting: Orthopaedic Surgery

## 2020-03-15 ENCOUNTER — Other Ambulatory Visit: Payer: Self-pay

## 2020-03-15 ENCOUNTER — Ambulatory Visit: Payer: Self-pay

## 2020-03-15 VITALS — Ht 65.0 in | Wt 240.6 lb

## 2020-03-15 DIAGNOSIS — M170 Bilateral primary osteoarthritis of knee: Secondary | ICD-10-CM | POA: Diagnosis not present

## 2020-03-15 MED ORDER — DICLOFENAC SODIUM 75 MG PO TBEC: 75.0000 mg | DELAYED_RELEASE_TABLET | Freq: Two times a day (BID) | ORAL | 2 refills | Status: DC

## 2020-03-15 NOTE — Progress Notes (Signed)
Office Visit Note   Patient: Kayla Moody           Date of Birth: 01-21-1966           MRN: [id number] Visit Date: 03/15/2020              Requested by: No referring provider defined for this encounter. PCP: Patient, No Pcp Per   Assessment & Plan: Visit Diagnoses:  1. Bilateral primary osteoarthritis of knee     Plan: Impression is bilateral knee DJD exacerbated by recent weight gain.  She understands how important it is to lose weight to help with her symptoms.  She knows that eventually she will likely need knee replacements but for now she will work on losing weight.  She requested the diclofenac to be refilled which I did today.  She she will continue with symptomatic treatment.  Follow-up as needed.  Follow-Up Instructions: Return if symptoms worsen or fail to improve.   Orders:  Orders Placed This Encounter  Procedures  . XR KNEE 3 VIEW RIGHT  . XR KNEE 3 VIEW LEFT   Meds ordered this encounter  Medications  . diclofenac (VOLTAREN) 75 MG EC tablet    Sig: Take 1 tablet (75 mg total) by mouth 2 (two) times daily.    Dispense:  30 tablet    Refill:  2      Procedures: No procedures performed   Clinical Data: No additional findings.   Subjective: Chief Complaint  Patient presents with  . Right Knee - Pain  . Left Knee - Pain    Kayla Moody is a 55 year old female that I have been seeing for quite some time and comes in for bilateral knee pain with recent walking and standing.  She takes diclofenac on a daily basis which does help.  She is working from home currently.  She unfortunately lost her husband earlier this year to pneumonia he was only 55 years old and as a result she has been doing a lot of stress eating and has gained at least 10 pounds and she definitely has developed the effects of this.  Denies any injuries.   Review of Systems  Constitutional: Negative.   HENT: Negative.   Eyes: Negative.   Respiratory: Negative.   Cardiovascular:  Negative.   Endocrine: Negative.   Musculoskeletal: Negative.   Neurological: Negative.   Hematological: Negative.   Psychiatric/Behavioral: Negative.   All other systems reviewed and are negative.    Objective: Vital Signs: Ht 5\' 5"  (1.651 m)   Wt 240 lb 9.6 oz (109.1 kg)   BMI 40.04 kg/m   Physical Exam Vitals and nursing note reviewed.  Constitutional:      Appearance: She is well-developed and well-nourished.  HENT:     Head: Normocephalic and atraumatic.  Eyes:     Extraocular Movements: EOM normal.  Pulmonary:     Effort: Pulmonary effort is normal.  Abdominal:     Palpations: Abdomen is soft.  Musculoskeletal:     Cervical back: Neck supple.  Skin:    General: Skin is warm.     Capillary Refill: Capillary refill takes less than 2 seconds.  Neurological:     Mental Status: She is alert and oriented to person, place, and time.  Psychiatric:        Mood and Affect: Mood and affect normal.        Behavior: Behavior normal.        Thought Content: Thought content normal.  Judgment: Judgment normal.     Ortho Exam Bilateral knees show 2+ crepitus with range of motion and with pain.  Mild limitation in range of motion secondary to pain.  There is no effusion.  Collaterals and cruciates are stable.  Specialty Comments:  No specialty comments available.  Imaging: XR KNEE 3 VIEW LEFT  Result Date: 03/15/2020 Advanced tricompartmental DJD worse in the medial compartment  XR KNEE 3 VIEW RIGHT  Result Date: 03/15/2020 Moderately severe DJD of the right knee    PMFS History: Patient Active Problem List   Diagnosis Date Noted  . Bilateral primary osteoarthritis of knee 03/06/2018  . Primary localized osteoarthritis of knees, bilateral 04/23/2017  . Chronic tension-type headache, intractable 10/22/2016  . Essential hypertension 06/28/2016  . Heart murmur 06/28/2016   Past Medical History:  Diagnosis Date  . Allergy   . Hypertension     History  reviewed. No pertinent family history.  Past Surgical History:  Procedure Laterality Date  . CHOLECYSTECTOMY    . HERNIA REPAIR     Social History   Occupational History  . Not on file  Tobacco Use  . Smoking status: Never Smoker  . Smokeless tobacco: Never Used  Substance and Sexual Activity  . Alcohol use: No    Alcohol/week: 0.0 standard drinks  . Drug use: No  . Sexual activity: Never

## 2020-04-16 IMAGING — MG DIGITAL SCREENING BILAT W/ TOMO W/ CAD
8 series · 8 of 24 positions shown · non-contrast
Comparison: None.

CLINICAL DATA: Screening.

EXAM:
DIGITAL SCREENING BILATERAL MAMMOGRAM WITH TOMO AND CAD

[R CC synth-2D]
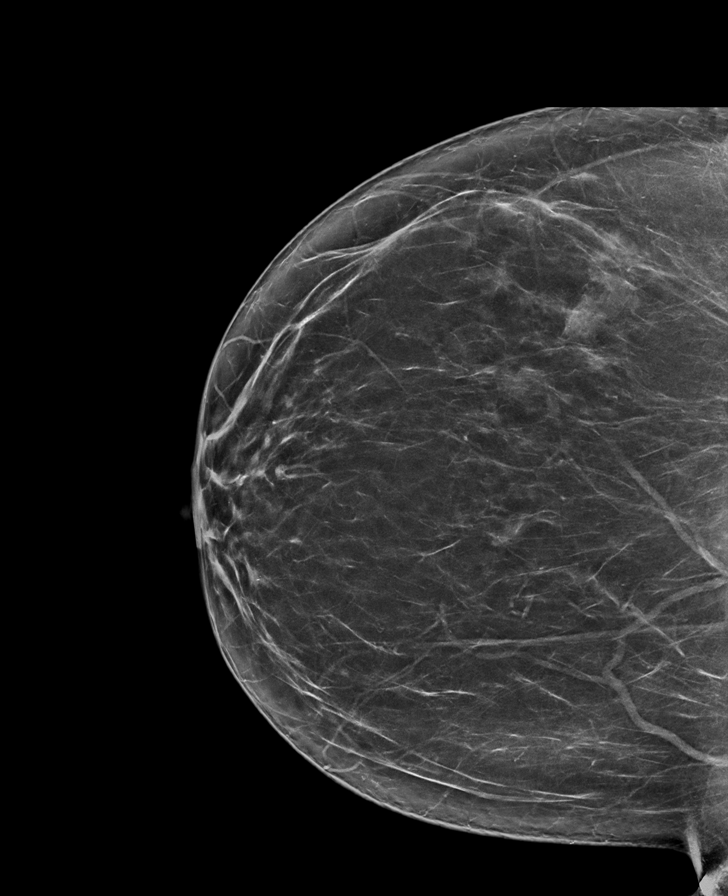

[L MLO synth-2D]
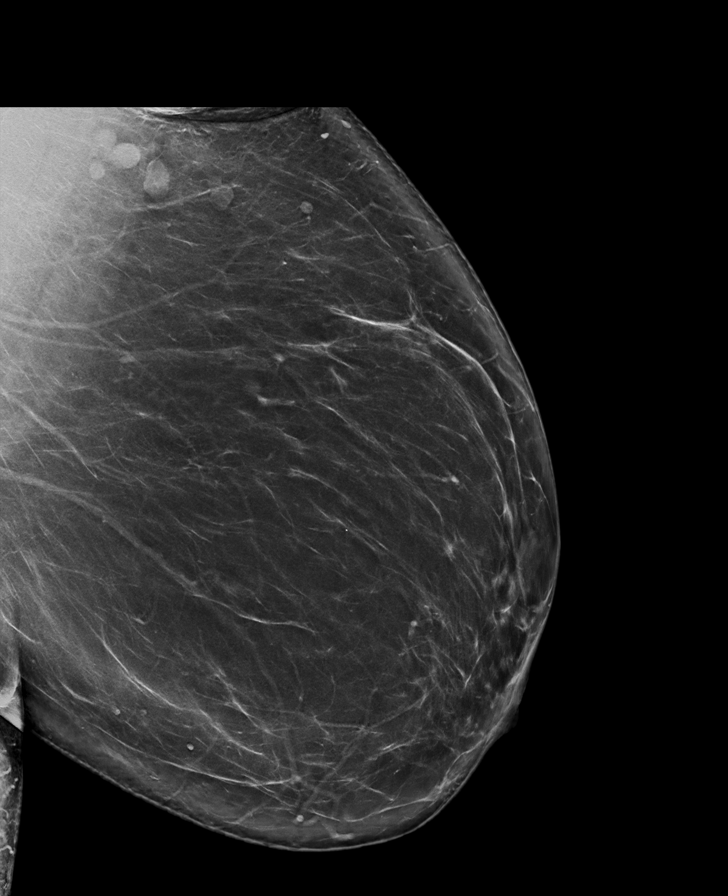

[L CC synth-2D]
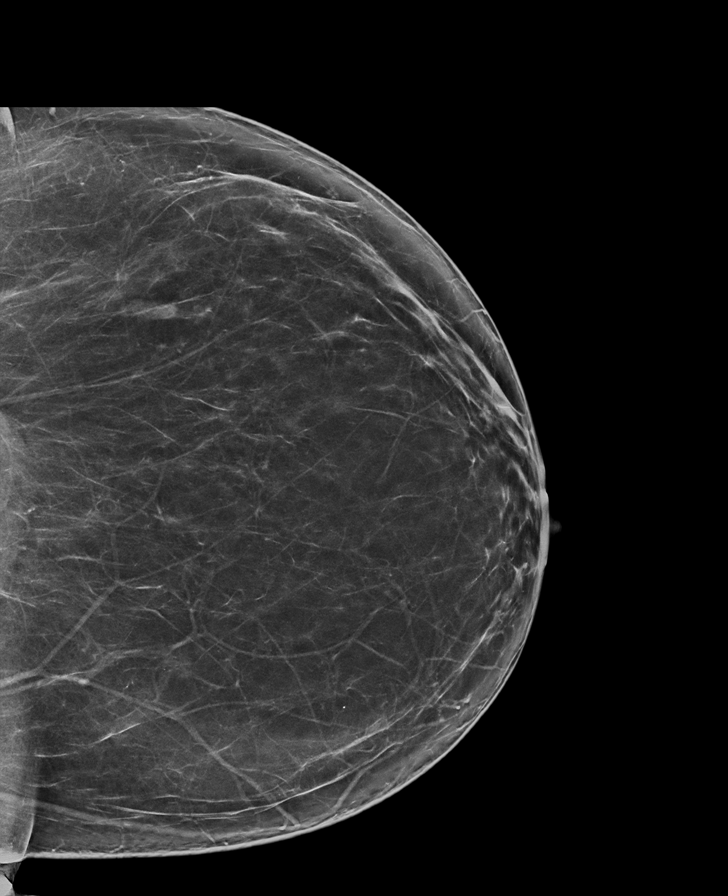

[R MLO synth-2D]
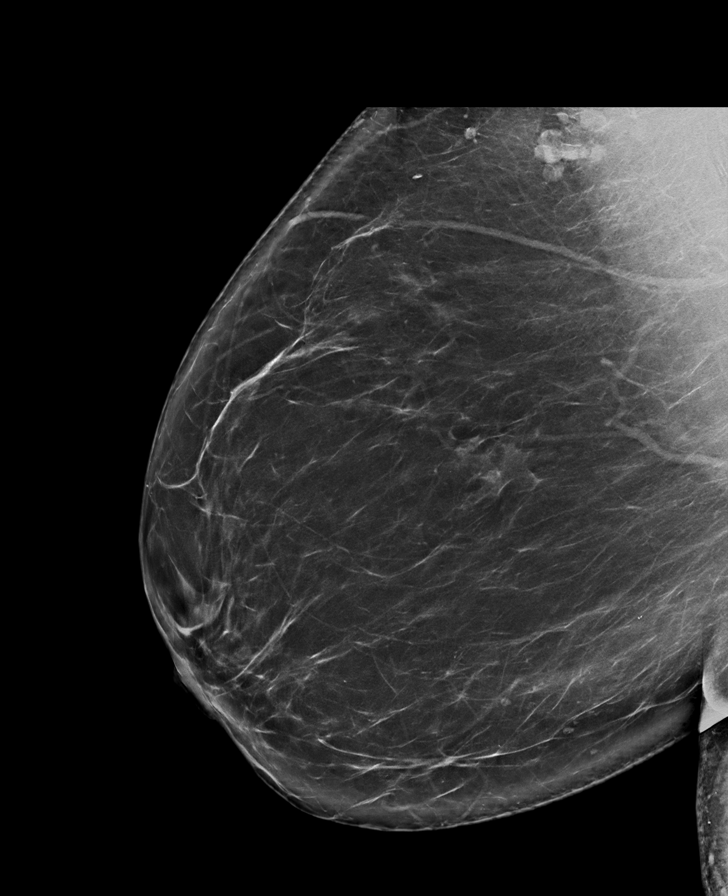

[R MLO tomo · tomo slice 47/94.0]
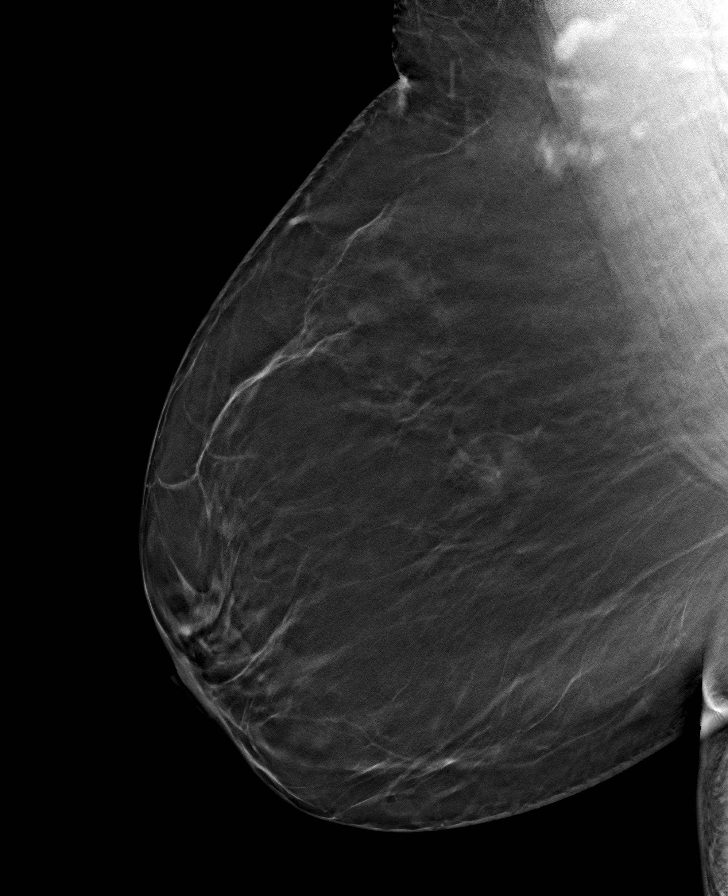

[R CC tomo · tomo slice 40/79.0]
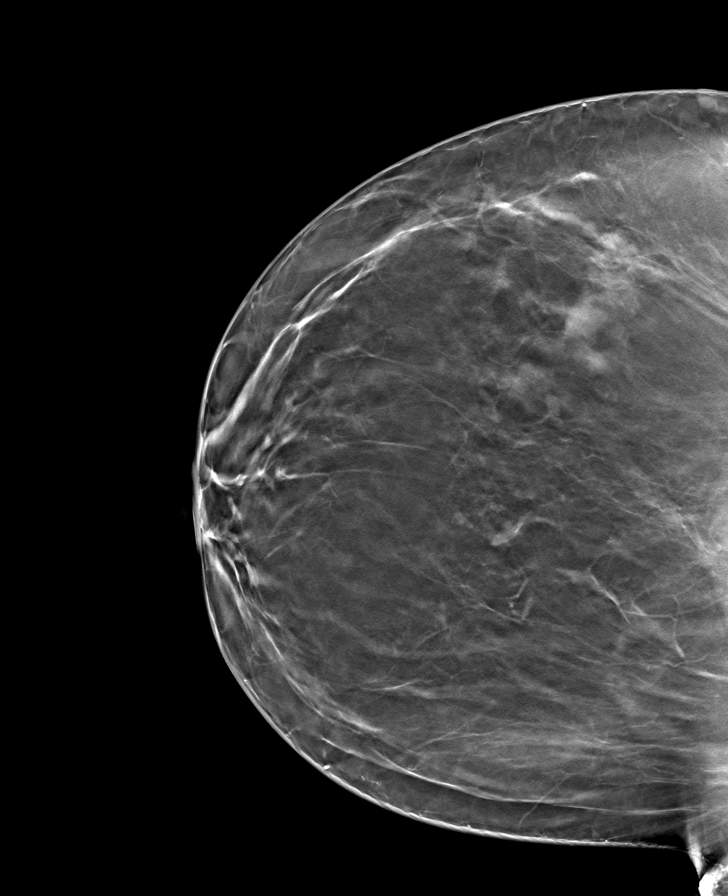

[L CC tomo · tomo slice 41/81.0]
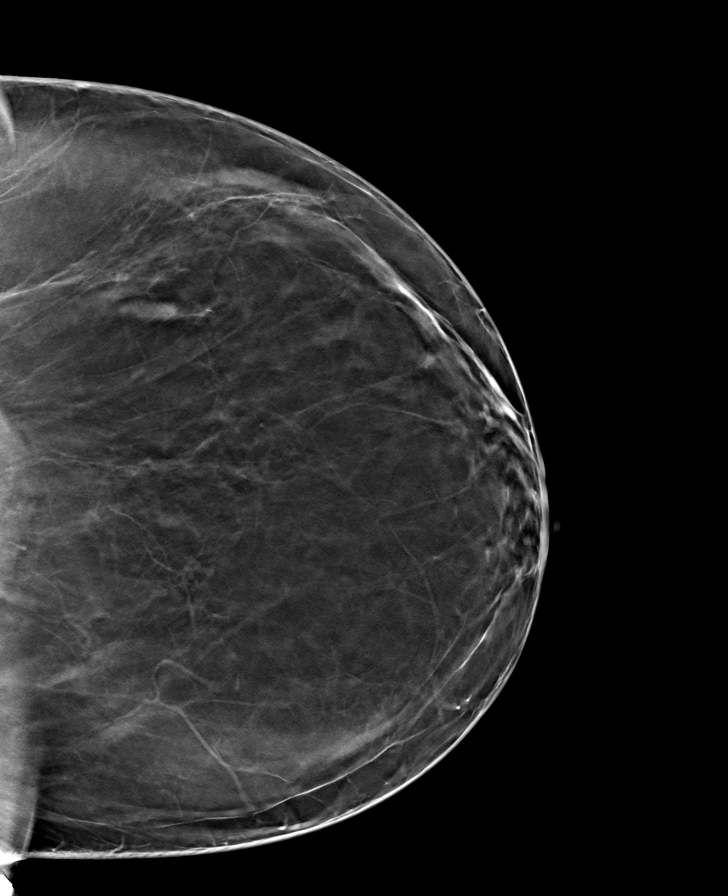

[L MLO tomo · tomo slice 49/97.0]
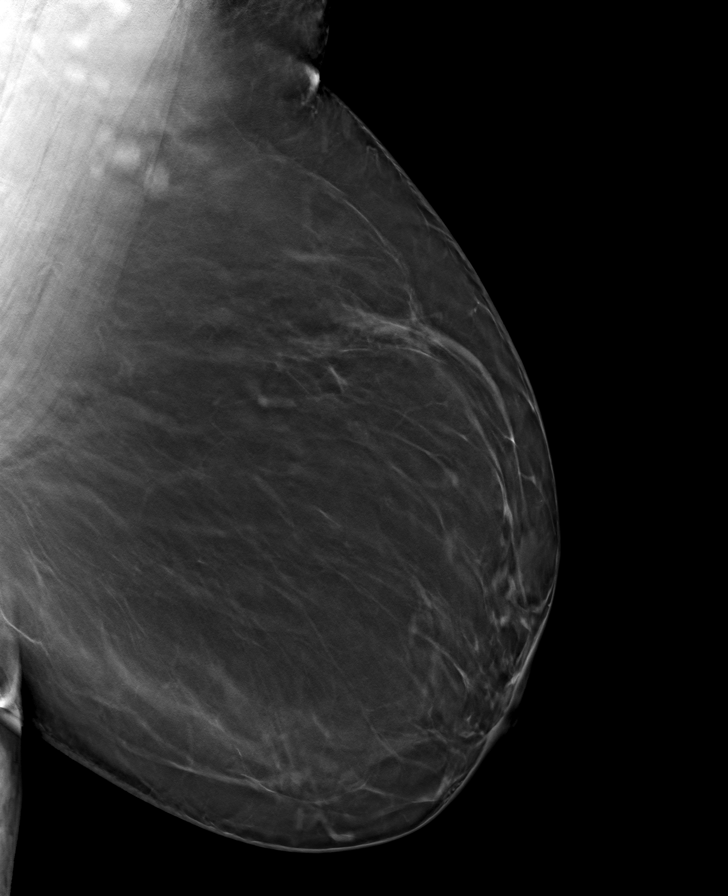

[8 of 24 positions shown; findings below may reference images not displayed]

ACR Breast Density Category b: There are scattered areas of
fibroglandular density.
FINDINGS: In the right breast, a possible asymmetry warrants further
evaluation. In the left breast, no findings suspicious for
malignancy. Images were processed with CAD.
IMPRESSION: Further evaluation is suggested for possible asymmetry in the right
breast.

RECOMMENDATION:
Diagnostic mammogram and possibly ultrasound of the right breast.
(Code:[id number]-[id number])

The patient will be contacted regarding the findings, and additional
imaging will be scheduled.

BI-RADS CATEGORY  0: Incomplete. Need additional imaging evaluation
and/or prior mammograms for comparison.

## 2020-05-27 ENCOUNTER — Telehealth: Payer: Self-pay | Admitting: Pharmacist

## 2020-05-27 NOTE — Telephone Encounter (Deleted)
Pt insurance called requesting a peer to peer for a PA for Leqvio. States it has to be with Dr. Katrinka Blazing.  Can call [phone] to schedule Ref [id number]  Dr. Katrinka Blazing- what availability might you have to do this?

## 2020-05-30 NOTE — Telephone Encounter (Deleted)
Insurance called back. Now state peer to peer can be done with a pharmacist. Apt availability was tomorrow at 10:30 with Dr. Terrilee Croak.  I have provided my name along with Margaretmary Dys name since I have a 10:30 f/u apt tomorrow. Can fax additional clinical info to [phone] Call [phone] with any questions

## 2020-05-31 NOTE — Addendum Note (Signed)
Addended by: Malena Peer D on: 05/31/2020 09:52 AM   Modules accepted: Level of Service, SmartSet

## 2020-05-31 NOTE — Telephone Encounter (Deleted)
This encounter was created in error - please disregard.

## 2020-05-31 NOTE — Telephone Encounter (Signed)
This was documented on the wrong patient. Please disregard. Will copy below message into correct patient's chart.

## 2020-06-11 ENCOUNTER — Emergency Department (HOSPITAL_COMMUNITY)
Admission: EM | Admit: 2020-06-11 | Discharge: 2020-06-11 | Disposition: A | Discharge status: Home / Self Care | Payer: 59 | Attending: Emergency Medicine | Admitting: Emergency Medicine

## 2020-06-11 ENCOUNTER — Encounter (HOSPITAL_COMMUNITY): Payer: Self-pay | Admitting: Emergency Medicine

## 2020-06-11 ENCOUNTER — Other Ambulatory Visit: Payer: Self-pay

## 2020-06-11 ENCOUNTER — Emergency Department (HOSPITAL_COMMUNITY): Payer: 59

## 2020-06-11 DIAGNOSIS — M791 Myalgia, unspecified site: Secondary | ICD-10-CM | POA: Insufficient documentation

## 2020-06-11 DIAGNOSIS — Z79899 Other long term (current) drug therapy: Secondary | ICD-10-CM | POA: Insufficient documentation

## 2020-06-11 DIAGNOSIS — M545 Low back pain, unspecified: Secondary | ICD-10-CM | POA: Diagnosis present

## 2020-06-11 DIAGNOSIS — M25511 Pain in right shoulder: Secondary | ICD-10-CM | POA: Diagnosis not present

## 2020-06-11 DIAGNOSIS — I10 Essential (primary) hypertension: Secondary | ICD-10-CM | POA: Diagnosis not present

## 2020-06-11 MED ORDER — MELOXICAM 7.5 MG PO TABS: 7.5000 mg | ORAL_TABLET | Freq: Every day | ORAL | 0 refills | Status: DC

## 2020-06-11 MED ORDER — METHOCARBAMOL 500 MG PO TABS: 500.0000 mg | ORAL_TABLET | Freq: Two times a day (BID) | ORAL | 0 refills | Status: DC

## 2020-06-11 MED ORDER — DICLOFENAC SODIUM 1 % EX GEL: 2.0000 g | Freq: Four times a day (QID) | CUTANEOUS | 0 refills | Status: DC

## 2020-06-11 MED ORDER — DICLOFENAC SODIUM 1 % EX GEL
2.0000 g | Freq: Four times a day (QID) | CUTANEOUS | 0 refills | Status: DC
Start: 2020-06-11 — End: 2020-11-30

## 2020-06-11 NOTE — Discharge Instructions (Addendum)
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
## 2020-07-12 ENCOUNTER — Encounter: Payer: Self-pay | Admitting: Orthopaedic Surgery

## 2020-07-12 ENCOUNTER — Other Ambulatory Visit: Payer: Self-pay

## 2020-07-12 ENCOUNTER — Ambulatory Visit (INDEPENDENT_AMBULATORY_CARE_PROVIDER_SITE_OTHER): Payer: 59 | Admitting: Orthopaedic Surgery

## 2020-07-12 VITALS — Ht 65.0 in | Wt 240.0 lb

## 2020-07-12 DIAGNOSIS — M170 Bilateral primary osteoarthritis of knee: Secondary | ICD-10-CM | POA: Diagnosis not present

## 2020-07-12 MED ORDER — LIDOCAINE HCL 1 % IJ SOLN
2.0000 mL | INTRAMUSCULAR | Status: AC | PRN
  Administered 2020-07-12: 2 mL

## 2020-07-12 MED ORDER — METHYLPREDNISOLONE ACETATE 40 MG/ML IJ SUSP
40.0000 mg | INTRAMUSCULAR | Status: AC | PRN
  Administered 2020-07-12: 40 mg via INTRA_ARTICULAR

## 2020-07-12 MED ORDER — BUPIVACAINE HCL 0.5 % IJ SOLN
2.0000 mL | INTRAMUSCULAR | Status: AC | PRN
  Administered 2020-07-12: 2 mL via INTRA_ARTICULAR

## 2020-07-12 NOTE — Progress Notes (Signed)
Office Visit Note   Patient: Kayla Moody           Date of Birth: [birth year]           MRN: [id number] Visit Date: 07/12/2020              Requested by: No referring provider defined for this encounter. PCP: Patient, No Pcp Per (Inactive)   Assessment & Plan: Visit Diagnoses:  1. Bilateral primary osteoarthritis of knee     Plan: Impression is moderately severe bilateral knee degenerative joint disease.  Based on her options we repeated the cortisone injections today.  We also had a brief discussion on likelihood of the knee replacements in the future.  She will think about her options going forward.  She tolerated the injections well today.  We will see her back as needed.  Follow-Up Instructions: Return if symptoms worsen or fail to improve.   Orders:  No orders of the defined types were placed in this encounter.  No orders of the defined types were placed in this encounter.     Procedures: Large Joint Inj: bilateral knee on 07/12/2020 11:33 AM Indications: pain Details: 22 G needle  Arthrogram: No  Medications (Right): 2 mL lidocaine 1 %; 2 mL bupivacaine 0.5 %; 40 mg methylPREDNISolone acetate 40 MG/ML Medications (Left): 2 mL lidocaine 1 %; 2 mL bupivacaine 0.5 %; 40 mg methylPREDNISolone acetate 40 MG/ML Outcome: tolerated well, no immediate complications Patient was prepped and draped in the usual sterile fashion.       Clinical Data: No additional findings.   Subjective: Chief Complaint  Patient presents with  . Left Knee - Pain  . Right Knee - Pain    Kayla Moody returns today for bilateral worsening knee pain.  We have seen her in the past for this and cortisone injections have worked well in the past.  She is requesting repeating them today.  She currently takes Tylenol for arthritic pain.  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  She is having more trouble with daily activities because of the severe knee pain.   Review of Systems   Constitutional: Negative.   HENT: Negative.   Eyes: Negative.   Respiratory: Negative.   Cardiovascular: Negative.   Endocrine: Negative.   Musculoskeletal: Negative.   Neurological: Negative.   Hematological: Negative.   Psychiatric/Behavioral: Negative.   All other systems reviewed and are negative.    Objective: Vital Signs: Ht 5\' 5"  (1.651 m)   Wt 240 lb (108.9 kg)   BMI 39.94 kg/m   Physical Exam Vitals and nursing note reviewed.  Constitutional:      Appearance: She is well-developed.  Pulmonary:     Effort: Pulmonary effort is normal.  Skin:    General: Skin is warm.     Capillary Refill: Capillary refill takes less than 2 seconds.  Neurological:     Mental Status: She is alert and oriented to person, place, and time.  Psychiatric:        Behavior: Behavior normal.        Thought Content: Thought content normal.        Judgment: Judgment normal.     Ortho Exam Bilateral knees show no joint effusion.  Moderate pain and crepitus with range of motion.  Collaterals and cruciates are stable. Specialty Comments:  No specialty comments available.  Imaging: No results found.   PMFS History: Patient Active Problem List   Diagnosis Date Noted  . Bilateral  primary osteoarthritis of knee 03/06/2018  . Primary localized osteoarthritis of knees, bilateral 04/23/2017  . Chronic tension-type headache, intractable 10/22/2016  . Essential hypertension 06/28/2016  . Heart murmur 06/28/2016   Past Medical History:  Diagnosis Date  . Allergy   . Hypertension     History reviewed. No pertinent family history.  Past Surgical History:  Procedure Laterality Date  . CHOLECYSTECTOMY    . HERNIA REPAIR     Social History   Occupational History  . Not on file  Tobacco Use  . Smoking status: Never Smoker  . Smokeless tobacco: Never Used  Substance and Sexual Activity  . Alcohol use: No    Alcohol/week: 0.0 standard drinks  . Drug use: No  . Sexual activity:  Never

## 2020-11-02 ENCOUNTER — Telehealth: Payer: Self-pay | Admitting: Orthopaedic Surgery

## 2020-11-02 NOTE — Telephone Encounter (Signed)
Pt called stating she is still having a lot of pain in her knees, and would like to know if she can get a stronger pain rx? Pt would like a CB to let her know if this will be possible please.   [phone]

## 2020-11-02 NOTE — Telephone Encounter (Signed)
I called patient no answer LMOM.   What would she like Tramadol or Tylenol # 3? Nothing stronger. See Lindsey's message below.

## 2020-11-02 NOTE — Telephone Encounter (Signed)
What is she taking?  We can do tramadol or tylenol 3, but nothing stronger

## 2020-11-02 NOTE — Telephone Encounter (Signed)
Patient called. She would like Tylenol # 3 called in to [facility] on [address]. Her call back number is [phone]

## 2020-11-03 ENCOUNTER — Other Ambulatory Visit: Payer: Self-pay | Admitting: Physician Assistant

## 2020-11-03 MED ORDER — ACETAMINOPHEN-CODEINE #3 300-30 MG PO TABS: 1.0000 | ORAL_TABLET | Freq: Three times a day (TID) | ORAL | 0 refills | Status: DC | PRN

## 2020-11-03 MED ORDER — ACETAMINOPHEN-CODEINE #3 300-30 MG PO TABS: 1.0000 | ORAL_TABLET | Freq: Three times a day (TID) | ORAL | 1 refills | Status: DC | PRN

## 2020-11-03 NOTE — Telephone Encounter (Signed)
Sent in

## 2020-11-28 ENCOUNTER — Emergency Department (HOSPITAL_COMMUNITY): Payer: 59

## 2020-11-28 ENCOUNTER — Other Ambulatory Visit: Payer: Self-pay

## 2020-11-28 ENCOUNTER — Encounter (HOSPITAL_COMMUNITY): Payer: Self-pay | Admitting: Emergency Medicine

## 2020-11-28 ENCOUNTER — Inpatient Hospital Stay (HOSPITAL_COMMUNITY)
Admission: EM | Admit: 2020-11-28 | Discharge: 2020-11-30 | DRG: 378 | Disposition: A | Discharge status: Home / Self Care | Payer: 59 | Attending: Family Medicine | Admitting: Family Medicine

## 2020-11-28 DIAGNOSIS — K264 Chronic or unspecified duodenal ulcer with hemorrhage: Principal | ICD-10-CM | POA: Diagnosis present

## 2020-11-28 DIAGNOSIS — K2970 Gastritis, unspecified, without bleeding: Secondary | ICD-10-CM | POA: Diagnosis not present

## 2020-11-28 DIAGNOSIS — K2980 Duodenitis without bleeding: Secondary | ICD-10-CM | POA: Diagnosis present

## 2020-11-28 DIAGNOSIS — D649 Anemia, unspecified: Secondary | ICD-10-CM | POA: Diagnosis not present

## 2020-11-28 DIAGNOSIS — K921 Melena: Secondary | ICD-10-CM | POA: Diagnosis not present

## 2020-11-28 DIAGNOSIS — Z880 Allergy status to penicillin: Secondary | ICD-10-CM | POA: Diagnosis not present

## 2020-11-28 DIAGNOSIS — K219 Gastro-esophageal reflux disease without esophagitis: Secondary | ICD-10-CM | POA: Diagnosis present

## 2020-11-28 DIAGNOSIS — Z6836 Body mass index (BMI) 36.0-36.9, adult: Secondary | ICD-10-CM

## 2020-11-28 DIAGNOSIS — M170 Bilateral primary osteoarthritis of knee: Secondary | ICD-10-CM | POA: Diagnosis present

## 2020-11-28 DIAGNOSIS — I10 Essential (primary) hypertension: Secondary | ICD-10-CM | POA: Diagnosis present

## 2020-11-28 DIAGNOSIS — D62 Acute posthemorrhagic anemia: Secondary | ICD-10-CM | POA: Diagnosis present

## 2020-11-28 DIAGNOSIS — K2981 Duodenitis with bleeding: Secondary | ICD-10-CM | POA: Diagnosis not present

## 2020-11-28 DIAGNOSIS — K760 Fatty (change of) liver, not elsewhere classified: Secondary | ICD-10-CM | POA: Diagnosis present

## 2020-11-28 DIAGNOSIS — I352 Nonrheumatic aortic (valve) stenosis with insufficiency: Secondary | ICD-10-CM | POA: Diagnosis present

## 2020-11-28 DIAGNOSIS — Z20822 Contact with and (suspected) exposure to covid-19: Secondary | ICD-10-CM | POA: Diagnosis present

## 2020-11-28 DIAGNOSIS — B9681 Helicobacter pylori [H. pylori] as the cause of diseases classified elsewhere: Secondary | ICD-10-CM | POA: Diagnosis present

## 2020-11-28 DIAGNOSIS — R011 Cardiac murmur, unspecified: Secondary | ICD-10-CM | POA: Diagnosis not present

## 2020-11-28 DIAGNOSIS — Z79899 Other long term (current) drug therapy: Secondary | ICD-10-CM | POA: Diagnosis not present

## 2020-11-28 DIAGNOSIS — R739 Hyperglycemia, unspecified: Secondary | ICD-10-CM | POA: Diagnosis present

## 2020-11-28 DIAGNOSIS — K922 Gastrointestinal hemorrhage, unspecified: Secondary | ICD-10-CM

## 2020-11-28 DIAGNOSIS — E669 Obesity, unspecified: Secondary | ICD-10-CM | POA: Diagnosis present

## 2020-11-28 HISTORY — DX: Anemia, unspecified: D64.9

## 2020-11-28 LAB — COMPREHENSIVE METABOLIC PANEL
ALT: 21 U/L (ref 0–44)
AST: 16 U/L (ref 15–41)
Albumin: 3.5 g/dL (ref 3.5–5.0)
Alkaline Phosphatase: 50 U/L (ref 38–126)
Anion gap: 10 (ref 5–15)
BUN: 25 mg/dL — ABNORMAL HIGH (ref 6–20)
CO2: 25 mmol/L (ref 22–32)
Calcium: 8.9 mg/dL (ref 8.9–10.3)
Chloride: 105 mmol/L (ref 98–111)
Creatinine, Ser: 0.69 mg/dL (ref 0.44–1.00)
GFR, Estimated: >60 mL/min (ref >60)
Glucose, Bld: 148 mg/dL — ABNORMAL HIGH (ref 70–99)
Potassium: 3.6 mmol/L (ref 3.5–5.1)
Sodium: 140 mmol/L (ref 135–145)
Total Bilirubin: 0.5 mg/dL (ref 0.3–1.2)
Total Protein: 6.2 g/dL — ABNORMAL LOW (ref 6.5–8.1)

## 2020-11-28 LAB — FERRITIN: Ferritin: 64 ng/mL (ref 11–307)

## 2020-11-28 LAB — PROTIME-INR
INR: 1.2 (ref 0.8–1.2)
Prothrombin Time: 14.8 seconds (ref 11.4–15.2)

## 2020-11-28 LAB — CBC WITH DIFFERENTIAL/PLATELET
Abs Immature Granulocytes: 0.07 10*3/uL (ref 0.00–0.07)
Basophils Absolute: 0.1 10*3/uL (ref 0.0–0.1)
Basophils Relative: 1 %
Eosinophils Absolute: 0 10*3/uL (ref 0.0–0.5)
Eosinophils Relative: 0 %
HCT: 22.8 % — ABNORMAL LOW (ref 36.0–46.0)
Hemoglobin: 7.7 g/dL — ABNORMAL LOW (ref 12.0–15.0)
Immature Granulocytes: 1 %
Lymphocytes Relative: 43 %
Lymphs Abs: 4.3 10*3/uL — ABNORMAL HIGH (ref 0.7–4.0)
MCH: 30.6 pg (ref 26.0–34.0)
MCHC: 33.8 g/dL (ref 30.0–36.0)
MCV: 90.5 fL (ref 80.0–100.0)
Monocytes Absolute: 0.7 10*3/uL (ref 0.1–1.0)
Monocytes Relative: 7 %
Neutro Abs: 4.9 10*3/uL (ref 1.7–7.7)
Neutrophils Relative %: 48 %
Platelets: 272 10*3/uL (ref 150–400)
RBC: 2.52 MIL/uL — ABNORMAL LOW (ref 3.87–5.11)
RDW: 12.4 % (ref 11.5–15.5)
WBC: 10 10*3/uL (ref 4.0–10.5)
nRBC: 0 % (ref 0.0–0.2)

## 2020-11-28 LAB — CBC
HCT: 27.5 % — ABNORMAL LOW (ref 36.0–46.0)
Hemoglobin: 9 g/dL — ABNORMAL LOW (ref 12.0–15.0)
MCH: 29.9 pg (ref 26.0–34.0)
MCHC: 32.7 g/dL (ref 30.0–36.0)
MCV: 91.4 fL (ref 80.0–100.0)
Platelets: 323 10*3/uL (ref 150–400)
RBC: 3.01 MIL/uL — ABNORMAL LOW (ref 3.87–5.11)
RDW: 12.1 % (ref 11.5–15.5)
WBC: 10.2 10*3/uL (ref 4.0–10.5)
nRBC: 0 % (ref 0.0–0.2)

## 2020-11-28 LAB — ABO/RH: ABO/RH(D): A POS

## 2020-11-28 LAB — RESP PANEL BY RT-PCR (FLU A&B, COVID) ARPGX2
Influenza A by PCR: NEGATIVE
Influenza B by PCR: NEGATIVE
SARS Coronavirus 2 by RT PCR: NEGATIVE

## 2020-11-28 LAB — POC OCCULT BLOOD, ED: Fecal Occult Bld: POSITIVE — AB

## 2020-11-28 LAB — IRON AND TIBC
Iron: 88 ug/dL (ref 28–170)
Saturation Ratios: 26 % (ref 10.4–31.8)
TIBC: 340 ug/dL (ref 250–450)
UIBC: 252 ug/dL

## 2020-11-28 LAB — HIV ANTIBODY (ROUTINE TESTING W REFLEX): HIV Screen 4th Generation wRfx: NONREACTIVE

## 2020-11-28 MED ORDER — POLYETHYLENE GLYCOL 3350 17 G PO PACK
17.0000 g | PACK | Freq: Every day | ORAL | Status: DC
  Filled 2020-11-28 (×2): qty 1

## 2020-11-28 MED ORDER — FENTANYL CITRATE PF 50 MCG/ML IJ SOSY
50.0000 ug | PREFILLED_SYRINGE | Freq: Once | INTRAMUSCULAR | Status: AC
Start: 2020-11-28 — End: 2020-11-28
  Administered 2020-11-28: 50 ug via INTRAVENOUS
  Filled 2020-11-28: qty 1

## 2020-11-28 MED ORDER — SODIUM CHLORIDE 0.9 % IV SOLN: INTRAVENOUS | Status: DC

## 2020-11-28 MED ORDER — SODIUM CHLORIDE 0.9 % IV BOLUS
1000.0000 mL | Freq: Once | INTRAVENOUS | Status: AC
  Administered 2020-11-28: 1000 mL via INTRAVENOUS

## 2020-11-28 MED ORDER — PANTOPRAZOLE SODIUM 40 MG PO TBEC
40.0000 mg | DELAYED_RELEASE_TABLET | Freq: Two times a day (BID) | ORAL | Status: DC
  Administered 2020-11-28: 40 mg via ORAL
  Filled 2020-11-28: qty 1

## 2020-11-28 MED ORDER — IOHEXOL 350 MG/ML SOLN
100.0000 mL | Freq: Once | INTRAVENOUS | Status: AC | PRN
  Administered 2020-11-28: 100 mL via INTRAVENOUS

## 2020-11-28 NOTE — Progress Notes (Signed)
New Admission Note:  Arrival Method: Via stretcher from ED to 72m15 Mental Orientation: Alert & Oriented x4 Telemetry: CCMD Box-15 Assessment: Completed Skin: Refer to flowsheet IV: Right AC Pain: 0/10 Safety Measures: Safety Fall Prevention Plan discussed with patient. Admission: Completed [facility] Orientation: Patient has been orientated to the room, unit and the staff.  Orders have been reviewed and are being implemented. Will continue to monitor the patient. Call light has been placed within reach.   Aram Candela, RN  Phone Number: [phone]

## 2020-11-28 NOTE — H&P (View-Only) (Signed)
Consultation  Referring Provider: Dr. Stevie Kern    Primary Care Physician:  Patient, No Pcp Per (Inactive) Primary Gastroenterologist:   Gentry Fitz      Reason for Consultation:   Hematochezia         HPI:   Kayla Moody is a 55 y.o. female as listed below as well as osteoarthritis, who presented to the ED on 9/19 with a complaint of hematochezia.    Today, the patient explains that on Friday she started to feel a little bit different and had a decrease in appetite, then Sunday morning when getting out of bed she felt fairly dizzy and passed a small black stool and then continued to have about 4 further bowel movements which were very dark in coloration and looked like blood.  She came to the ED yesterday evening and since then has passed about 2 or 3 more with her last one about 45 minutes ago per her which are still dark in coloration and look like blood.  Along with this describes starting with an epigastric/upper abdominal pain around the same time she started passing the black stools rated as a constant 6-09/2008 and just kind of "achy".  Describes history of reflux symptoms and having to use over-the-counter acid reduction products once daily for a few days every couple of months to get rid of the symptoms.  Denies any recent NSAID use but has been using Tylenol arthritis due to osteoarthritis in her knees.  Associated symptoms include dizziness.    Her husband passed away a year ago.  Daughter very concerned about her.  No previous GI bleeds or endoscopic evaluation.    Denies fever, chills, weight loss, nausea or vomiting.  ED course: Hemoccult positive, hemoglobin 9 (3 years ago 13.5), BUN 25 (3 years ago 11), CT angio abdomen/pelvis with and or without contrast with no CT evidence of active contrast extravasation, no acute abdominal pelvic findings, hepatic steatosis  GI history: None  Past Medical History:  Diagnosis Date   Allergy    Hypertension     Past Surgical History:   Procedure Laterality Date   CHOLECYSTECTOMY     HERNIA REPAIR      Family History: No GI cancers  Social History   Tobacco Use   Smoking status: Never   Smokeless tobacco: Never  Substance Use Topics   Alcohol use: No    Alcohol/week: 0.0 standard drinks   Drug use: No    Prior to Admission medications   Medication Sig Start Date End Date Taking? Authorizing Provider  acetaminophen (TYLENOL) 650 MG CR tablet Take 650-1,300 mg by mouth 2 (two) times daily as needed (for arthritic pain).   Yes [provider]  acetaminophen-codeine (TYLENOL #3) 300-30 MG tablet Take 1 tablet by mouth every 8 (eight) hours as needed for moderate pain. 11/03/20  Yes Cristie Hem, PA-C  amLODipine (NORVASC) 10 MG tablet TAKE 1 TABLET BY MOUTH DAILY Patient taking differently: Take 10 mg by mouth daily. 05/19/18  Yes Sagardia, Eilleen Kempf, MD  cloNIDine (CATAPRES) 0.2 MG tablet Take 0.2 mg by mouth daily as needed (as directed for elevated B/P).   Yes [provider]  diclofenac (VOLTAREN) 75 MG EC tablet Take 1 tablet (75 mg total) by mouth 2 (two) times daily. Patient taking differently: Take 75 mg by mouth 2 (two) times daily as needed (for pain). 03/15/20  Yes Tarry Kos, MD  Lansoprazole (HEARTBURN RELIEF 24 HOUR PO) Take 1 tablet by mouth  daily as needed (for heartburn or reflux).   Yes [provider]  olmesartan-hydrochlorothiazide (BENICAR HCT) 40-12.5 MG tablet Take 1 tablet by mouth daily. 11/10/20  Yes [provider]  diclofenac Sodium (VOLTAREN) 1 % GEL Apply 2 g topically 4 (four) times daily. Patient not taking: No sig reported 06/11/20   Gailen Shelter, PA  hydrochlorothiazide (HYDRODIURIL) 25 MG tablet Take 1 tablet (25 mg total) by mouth daily. Patient not taking: No sig reported 06/28/16 11/28/20  Georgina Quint, MD  lisinopril (PRINIVIL,ZESTRIL) 40 MG tablet Take 1 tablet (40 mg total) by mouth daily. Patient not taking: No sig reported  05/14/17   Georgina Quint, MD  meloxicam (MOBIC) 7.5 MG tablet Take 1 tablet (7.5 mg total) by mouth daily. Patient not taking: No sig reported 06/11/20   Gailen Shelter, PA  methocarbamol (ROBAXIN) 500 MG tablet Take 1 tablet (500 mg total) by mouth 2 (two) times daily. Patient not taking: No sig reported 06/11/20   Gailen Shelter, PA  traMADol (ULTRAM) 50 MG tablet Take 1-2 tablets (50-100 mg total) by mouth at bedtime as needed. Patient not taking: No sig reported 02/12/20   Cristie Hem, PA-C    No current facility-administered medications for this encounter.   Current Outpatient Medications  Medication Sig Dispense Refill   acetaminophen (TYLENOL) 650 MG CR tablet Take 650-1,300 mg by mouth 2 (two) times daily as needed (for arthritic pain).     acetaminophen-codeine (TYLENOL #3) 300-30 MG tablet Take 1 tablet by mouth every 8 (eight) hours as needed for moderate pain. 30 tablet 0   amLODipine (NORVASC) 10 MG tablet TAKE 1 TABLET BY MOUTH DAILY (Patient taking differently: Take 10 mg by mouth daily.) 42 tablet 0   cloNIDine (CATAPRES) 0.2 MG tablet Take 0.2 mg by mouth daily as needed (as directed for elevated B/P).     diclofenac (VOLTAREN) 75 MG EC tablet Take 1 tablet (75 mg total) by mouth 2 (two) times daily. (Patient taking differently: Take 75 mg by mouth 2 (two) times daily as needed (for pain).) 30 tablet 2   Lansoprazole (HEARTBURN RELIEF 24 HOUR PO) Take 1 tablet by mouth daily as needed (for heartburn or reflux).     olmesartan-hydrochlorothiazide (BENICAR HCT) 40-12.5 MG tablet Take 1 tablet by mouth daily.     diclofenac Sodium (VOLTAREN) 1 % GEL Apply 2 g topically 4 (four) times daily. (Patient not taking: No sig reported) 150 g 0   hydrochlorothiazide (HYDRODIURIL) 25 MG tablet Take 1 tablet (25 mg total) by mouth daily. (Patient not taking: No sig reported) 30 tablet 5   lisinopril (PRINIVIL,ZESTRIL) 40 MG tablet Take 1 tablet (40 mg total) by mouth daily.  (Patient not taking: No sig reported) 90 tablet 3   meloxicam (MOBIC) 7.5 MG tablet Take 1 tablet (7.5 mg total) by mouth daily. (Patient not taking: No sig reported) 7 tablet 0   methocarbamol (ROBAXIN) 500 MG tablet Take 1 tablet (500 mg total) by mouth 2 (two) times daily. (Patient not taking: No sig reported) 20 tablet 0   traMADol (ULTRAM) 50 MG tablet Take 1-2 tablets (50-100 mg total) by mouth at bedtime as needed. (Patient not taking: No sig reported) 30 tablet 0    Allergies as of 11/28/2020 - Review Complete 11/28/2020  Allergen Reaction Noted   Penicillins Anaphylaxis and Other (See Comments) 06/08/2015     Review of Systems:    Constitutional: No weight loss, fever or chills Skin: No  rash Cardiovascular: No chest pain Respiratory: No SOB  Gastrointestinal: See HPI and otherwise negative Genitourinary: No dysuria  Neurological: +dizziness Musculoskeletal: No new muscle or joint pain Hematologic: No bruising Psychiatric: No history of depression or anxiety    Physical Exam:  Vital signs in last 24 hours: Temp:  [98.1 F (36.7 C)-99.5 F (37.5 C)] 98.1 F (36.7 C) (09/19 1101) Pulse Rate:  [87-126] 91 (09/19 1333) Resp:  [15-19] 16 (09/19 1333) BP: (118-147)/(54-104) 123/61 (09/19 1333) SpO2:  [99 %-100 %] 100 % (09/19 1333) Weight:  [99.8 kg] 99.8 kg (09/19 1033)   General:   Pleasant Overweight AA female appears to be in NAD, Well developed, Well nourished, alert and cooperative Head:  Normocephalic and atraumatic. Eyes:   PEERL, EOMI. No icterus. Conjunctiva pink. Ears:  Normal auditory acuity. Neck:  Supple Throat: Oral cavity and pharynx without inflammation, swelling or lesion. Lungs: Respirations even and unlabored. Lungs clear to auscultation bilaterally.   No wheezes, crackles, or rhonchi.  Heart: Normal S1, S2. +murmur. Regular rate and rhythm. No peripheral edema, cyanosis or pallor.  Abdomen:  Soft, nondistended, moderate upper abdominal ttp with  involuntary guarding. Normal bowel sounds. No appreciable masses or hepatomegaly. Rectal: External: No abnormality; internal: Scant amount of melenic stool, no mass Msk:  Symmetrical without gross deformities. Peripheral pulses intact.  Extremities:  Without edema, no deformity or joint abnormality.  Neurologic:  Alert and  oriented x4;  grossly normal neurologically.  Skin:   Dry and intact without significant lesions or rashes. Psychiatric: Demonstrates good judgement and reason without abnormal affect or behaviors.   LAB RESULTS: Recent Labs    11/28/20 1021  WBC 10.2  HGB 9.0*  HCT 27.5*  PLT 323   BMET Recent Labs    11/28/20 1021  NA 140  K 3.6  CL 105  CO2 25  GLUCOSE 148*  BUN 25*  CREATININE 0.69  CALCIUM 8.9   LFT Recent Labs    11/28/20 1021  PROT 6.2*  ALBUMIN 3.5  AST 16  ALT 21  ALKPHOS 50  BILITOT 0.5   PT/INR Recent Labs    11/28/20 1118  LABPROT 14.8  INR 1.2    STUDIES: CT Angio Abd/Pel W and/or Wo Contrast  Result Date: 11/28/2020 CLINICAL DATA:  GI bleed.  Lower abdominal pain. EXAM: CTA ABDOMEN AND PELVIS WITHOUT AND WITH CONTRAST TECHNIQUE: Multidetector CT imaging of the abdomen and pelvis was performed using the standard protocol during bolus administration of intravenous contrast. Multiplanar reconstructed images and MIPs were obtained and reviewed to evaluate the vascular anatomy. CONTRAST:  OMNIPAQUE IOHEXOL 350 MG/ML SOLN COMPARISON:  None. FINDINGS: VASCULAR Aorta: Moderate calcified and noncalcified atheromatous disease abdominal aorta without aneurysmal dilation. No dissection, vasculitis or significant stenosis. Celiac: Patent without evidence of aneurysm, dissection, vasculitis or significant stenosis. No CTA evidence of active contrast extravasation. SMA: Patent without evidence of aneurysm, dissection, vasculitis or significant stenosis. No CTA evidence of active contrast extravasation. Renals: Both renal arteries are  patent without evidence of aneurysm, dissection, vasculitis, fibromuscular dysplasia or significant stenosis. IMA: Patent without evidence of aneurysm, dissection, vasculitis or significant stenosis. No CTA evidence of active contrast extravasation. Inflow: Patent without evidence of aneurysm, dissection, vasculitis or significant stenosis. Proximal Outflow: Bilateral common femoral and visualized portions of the superficial and profunda femoral arteries are patent without evidence of aneurysm, dissection, vasculitis or significant stenosis. Veins: No obvious venous abnormality within the limitations of this arterial phase study. Review of the MIP images  confirms the above findings. NON-VASCULAR Lower chest: No acute abnormality. Hepatobiliary: Diffuse hypoattenuation of the liver, consistent with hepatic steatosis. No focal liver abnormality is seen. Status post cholecystectomy. No biliary dilatation. Pancreas: Unremarkable. No pancreatic ductal dilatation or surrounding inflammatory changes. Spleen: Normal in size without focal abnormality. Perihilar accessory spleens. Adrenals/Urinary Tract: Adrenal glands are unremarkable. LEFT inferior renal pole exophytic cyst. Additional subcentimeter RIGHT hypodense renal cortical lesion is too small to characterize likely a small cyst. Kidneys are otherwise normal, without renal calculi, or hydronephrosis. Bladder is unremarkable. Stomach/Bowel: Stomach is within normal limits. Appendix appears normal. No evidence of bowel wall thickening, distention, or inflammatory changes. Lymphatic: No enlarged abdominopelvic. Reproductive: Uterus and bilateral adnexa are unremarkable. Other: No abdominal wall hernia or abnormality. No abdominopelvic ascites. Musculoskeletal: No acute or significant osseous findings. Mild degenerative changes of the imaged spine, greatest at L4-5 with moderate facet arthropathy. IMPRESSION: VASCULAR 1. No CTA evidence of active contrast extravasation.  NON-VASCULAR 1. No acute abdominopelvic findings. 2. Hepatic steatosis. Additional chronic and senescent changes as above. Roanna Banning, MD [facility] Electronically Signed   By: Roanna Banning M.D.   On: 11/28/2020 12:40     Impression / Plan:   Impression: 1.  Melena: Hemoglobin 9 (normal 3 years ago), CT angio negative, BUN mildly elevated, patient reports starting with black stool yesterday morning 2022/12/13, has passed about 6-7 stools since then with the last around 2/230 this afternoon, scant amount of melenic stool on rectal exam today, Hemoccult positive, history of reflux; consider gastritis versus PUD versus other upper GI source of bleeding 2.  Anemia: Hemoglobin 9 (13.5 on 05/14/2017), from above 3.  Hypertension 4.  Osteoarthritis: Describes using Tylenol only, no NSAIDs  Plan: 1.  Plan for EGD tomorrow with Dr. Leone Payor.  Did discuss risks, benefits, limitations and alternatives with the patient and she agrees to proceed. 2.  Patient will be on clear liquid diet today and n.p.o. at midnight 3.  We will start patient on Pantoprazole 40 mg p.o. twice daily 4.  Continue to monitor hemoglobin q. 6-8 hours with transfusion as needed less than 7 5.  Did discuss with patient that if we do not find an upper GI source for bleeding we may need to do a colonoscopy this admission, if we do not end up needing a colonoscopy this admission she will have to follow-up with [place] as an outpatient as she is overdue for screening  Thank you for your kind consultation, we will continue to follow.  Violet Baldy Lemmon  11/28/2020, 2:27 PM     [facility] Attending   I have taken an interval history, reviewed the chart and examined the patient. I agree with the Advanced Practitioner's note, impression and recommendations.  Majority the medical decision-making in the formulation of the assessment and plan were performed by me.  I saw the patient  yesterday.  Iva Boop, MD, [facility] 11/29/2020 8:10 AM

## 2020-11-28 NOTE — ED Notes (Signed)
Refused labs at this time. IV won't pull back. Phlebotomist tried to talk to pt. Will continue to monitor.

## 2020-11-28 NOTE — ED Notes (Signed)
Pt reports have 2 diarrhea episodes with bright bloody stool since last night, she reports dizziness when changing positions, and mild lower back pain

## 2020-11-28 NOTE — ED Provider Notes (Signed)
[facility] EMERGENCY DEPARTMENT Provider Note   CSN: [id number] Arrival date & time: 11/28/20  1443     History No chief complaint on file.   Kayla Moody is a 55 y.o. female.  Presenting to the emergency room with concern for bright red blood per rectum.  Since yesterday has had multiple episodes of bloody stools.  Initially bright red, then darker red.  Also having some lower abdominal pain.  Aching, moderate, nonradiating.  Had some associated lightheadedness, nausea but no vomiting.  No episodes of syncope.  She denies any history of GI bleed, has not seen GI previously.  Not on blood thinners.  HPI     Past Medical History:  Diagnosis Date   Allergy    Hypertension     Patient Active Problem List   Diagnosis Date Noted   Symptomatic anemia 11/28/2020   Bilateral primary osteoarthritis of knee 03/06/2018   Primary localized osteoarthritis of knees, bilateral 04/23/2017   Chronic tension-type headache, intractable 10/22/2016   Essential hypertension 06/28/2016   Heart murmur 06/28/2016    Past Surgical History:  Procedure Laterality Date   CHOLECYSTECTOMY     HERNIA REPAIR       OB History   No obstetric history on file.     No family history on file.  Social History   Tobacco Use   Smoking status: Never   Smokeless tobacco: Never  Substance Use Topics   Alcohol use: No    Alcohol/week: 0.0 standard drinks   Drug use: No    Home Medications Prior to Admission medications   Medication Sig Start Date End Date Taking? Authorizing Provider  acetaminophen (TYLENOL) 650 MG CR tablet Take 650-1,300 mg by mouth 2 (two) times daily as needed (for arthritic pain).   Yes [provider]  acetaminophen-codeine (TYLENOL #3) 300-30 MG tablet Take 1 tablet by mouth every 8 (eight) hours as needed for moderate pain. 11/03/20  Yes Cristie Hem, PA-C  amLODipine (NORVASC) 10 MG tablet TAKE 1 TABLET BY MOUTH DAILY Patient taking  differently: Take 10 mg by mouth daily. 05/19/18  Yes Sagardia, Eilleen Kempf, MD  cloNIDine (CATAPRES) 0.2 MG tablet Take 0.2 mg by mouth daily as needed (as directed for elevated B/P).   Yes [provider]  diclofenac (VOLTAREN) 75 MG EC tablet Take 1 tablet (75 mg total) by mouth 2 (two) times daily. Patient taking differently: Take 75 mg by mouth 2 (two) times daily as needed (for pain). 03/15/20  Yes Tarry Kos, MD  Lansoprazole (HEARTBURN RELIEF 24 HOUR PO) Take 1 tablet by mouth daily as needed (for heartburn or reflux).   Yes [provider]  olmesartan-hydrochlorothiazide (BENICAR HCT) 40-12.5 MG tablet Take 1 tablet by mouth daily. 11/10/20  Yes [provider]  diclofenac Sodium (VOLTAREN) 1 % GEL Apply 2 g topically 4 (four) times daily. Patient not taking: No sig reported 06/11/20   Gailen Shelter, PA  hydrochlorothiazide (HYDRODIURIL) 25 MG tablet Take 1 tablet (25 mg total) by mouth daily. Patient not taking: No sig reported 06/28/16 11/28/20  Georgina Quint, MD  lisinopril (PRINIVIL,ZESTRIL) 40 MG tablet Take 1 tablet (40 mg total) by mouth daily. Patient not taking: No sig reported 05/14/17   Georgina Quint, MD  meloxicam (MOBIC) 7.5 MG tablet Take 1 tablet (7.5 mg total) by mouth daily. Patient not taking: No sig reported 06/11/20   Gailen Shelter, PA  methocarbamol (ROBAXIN) 500 MG tablet Take 1 tablet (  500 mg total) by mouth 2 (two) times daily. Patient not taking: No sig reported 06/11/20   Gailen Shelter, PA  traMADol (ULTRAM) 50 MG tablet Take 1-2 tablets (50-100 mg total) by mouth at bedtime as needed. Patient not taking: No sig reported 02/12/20   Cristie Hem, PA-C    Allergies    Penicillins  Review of Systems   Review of Systems  Constitutional:  Negative for chills and fever.  HENT:  Negative for ear pain and sore throat.   Eyes:  Negative for pain and visual disturbance.  Respiratory:  Negative for cough and shortness of  breath.   Cardiovascular:  Negative for chest pain and palpitations.  Gastrointestinal:  Positive for abdominal pain and blood in stool. Negative for vomiting.  Genitourinary:  Negative for dysuria and hematuria.  Musculoskeletal:  Negative for arthralgias and back pain.  Skin:  Negative for color change and rash.  Neurological:  Negative for seizures and syncope.  All other systems reviewed and are negative.  Physical Exam Updated Vital Signs BP 123/61 (BP Location: Right Arm)   Pulse 90   Temp 98.1 F (36.7 C) (Oral)   Resp 16   Ht 5\' 5"  (1.651 m)   Wt 99.8 kg   SpO2 100%   BMI 36.61 kg/m   Physical Exam Vitals and nursing note reviewed. Exam conducted with a chaperone present.  Constitutional:      General: She is not in acute distress.    Appearance: She is well-developed.  HENT:     Head: Normocephalic and atraumatic.  Eyes:     Conjunctiva/sclera: Conjunctivae normal.  Cardiovascular:     Rate and Rhythm: Normal rate and regular rhythm.     Heart sounds: No murmur heard. Pulmonary:     Effort: Pulmonary effort is normal. No respiratory distress.     Breath sounds: Normal breath sounds.  Abdominal:     Palpations: Abdomen is soft.     Tenderness: There is no abdominal tenderness.  Genitourinary:    Comments: RN chaperone present  No hemorrhoids or fissures, stool is dark red Musculoskeletal:     Cervical back: Neck supple.  Skin:    General: Skin is warm and dry.  Neurological:     Mental Status: She is alert.    ED Results / Procedures / Treatments   Labs (all labs ordered are listed, but only abnormal results are displayed) Labs Reviewed  CBC - Abnormal; Notable for the following components:      Result Value   RBC 3.01 (*)    Hemoglobin 9.0 (*)    HCT 27.5 (*)    All other components within normal limits  COMPREHENSIVE METABOLIC PANEL - Abnormal; Notable for the following components:   Glucose, Bld 148 (*)    BUN 25 (*)    Total Protein 6.2 (*)     All other components within normal limits  POC OCCULT BLOOD, ED - Abnormal; Notable for the following components:   Fecal Occult Bld POSITIVE (*)    All other components within normal limits  RESP PANEL BY RT-PCR (FLU A&B, COVID) ARPGX2  PROTIME-INR  TYPE AND SCREEN  ABO/RH    EKG EKG Interpretation  Date/Time:  Monday November 28 2020 10:13:04 EDT Ventricular Rate:  129 PR Interval:  136 QRS Duration: 68 QT Interval:  296 QTC Calculation: 433 R Axis:   34 Text Interpretation: Sinus tachycardia Septal infarct , age undetermined Abnormal ECG Confirmed by [id number] (Marianna Fuss) on  11/28/2020 11:28:21 AM  Radiology CT Angio Abd/Pel W and/or Wo Contrast  Result Date: 11/28/2020 CLINICAL DATA:  GI bleed.  Lower abdominal pain. EXAM: CTA ABDOMEN AND PELVIS WITHOUT AND WITH CONTRAST TECHNIQUE: Multidetector CT imaging of the abdomen and pelvis was performed using the standard protocol during bolus administration of intravenous contrast. Multiplanar reconstructed images and MIPs were obtained and reviewed to evaluate the vascular anatomy. CONTRAST:  OMNIPAQUE IOHEXOL 350 MG/ML SOLN COMPARISON:  None. FINDINGS: VASCULAR Aorta: Moderate calcified and noncalcified atheromatous disease abdominal aorta without aneurysmal dilation. No dissection, vasculitis or significant stenosis. Celiac: Patent without evidence of aneurysm, dissection, vasculitis or significant stenosis. No CTA evidence of active contrast extravasation. SMA: Patent without evidence of aneurysm, dissection, vasculitis or significant stenosis. No CTA evidence of active contrast extravasation. Renals: Both renal arteries are patent without evidence of aneurysm, dissection, vasculitis, fibromuscular dysplasia or significant stenosis. IMA: Patent without evidence of aneurysm, dissection, vasculitis or significant stenosis. No CTA evidence of active contrast extravasation. Inflow: Patent without evidence of aneurysm, dissection,  vasculitis or significant stenosis. Proximal Outflow: Bilateral common femoral and visualized portions of the superficial and profunda femoral arteries are patent without evidence of aneurysm, dissection, vasculitis or significant stenosis. Veins: No obvious venous abnormality within the limitations of this arterial phase study. Review of the MIP images confirms the above findings. NON-VASCULAR Lower chest: No acute abnormality. Hepatobiliary: Diffuse hypoattenuation of the liver, consistent with hepatic steatosis. No focal liver abnormality is seen. Status post cholecystectomy. No biliary dilatation. Pancreas: Unremarkable. No pancreatic ductal dilatation or surrounding inflammatory changes. Spleen: Normal in size without focal abnormality. Perihilar accessory spleens. Adrenals/Urinary Tract: Adrenal glands are unremarkable. LEFT inferior renal pole exophytic cyst. Additional subcentimeter RIGHT hypodense renal cortical lesion is too small to characterize likely a small cyst. Kidneys are otherwise normal, without renal calculi, or hydronephrosis. Bladder is unremarkable. Stomach/Bowel: Stomach is within normal limits. Appendix appears normal. No evidence of bowel wall thickening, distention, or inflammatory changes. Lymphatic: No enlarged abdominopelvic. Reproductive: Uterus and bilateral adnexa are unremarkable. Other: No abdominal wall hernia or abnormality. No abdominopelvic ascites. Musculoskeletal: No acute or significant osseous findings. Mild degenerative changes of the imaged spine, greatest at L4-5 with moderate facet arthropathy. IMPRESSION: VASCULAR 1. No CTA evidence of active contrast extravasation. NON-VASCULAR 1. No acute abdominopelvic findings. 2. Hepatic steatosis. Additional chronic and senescent changes as above. Roanna Banning, MD [facility] Radiology Electronically Signed   By: Roanna Banning M.D.   On: 11/28/2020 12:40    Procedures Procedures    Medications Ordered in ED Medications  sodium chloride 0.9 % bolus 1,000 mL (0 mLs Intravenous Stopped 11/28/20 1241)  iohexol (OMNIPAQUE) 350 MG/ML injection 100 mL (100 mLs Intravenous Contrast Given 11/28/20 1205)  fentaNYL (SUBLIMAZE) injection 50 mcg (50 mcg Intravenous Given 11/28/20 1239)    ED Course  I have reviewed the triage vital signs and the nursing notes.  Pertinent labs & imaging results that were available during my care of the patient were reviewed by me and considered in my medical decision making (see chart for details).    MDM Rules/Calculators/A&P                           55 year old presented to the emergency room with concern for blood in stool.  On exam she appears well in no distress.  Initially tachycardic but this resolved with some fluids and rest.  Blood work noted for significant  drop in hemoglobin, baseline 13, down to 9 today.  Given hemodynamic stability, will defer blood transfusion for now.  Given significant drop, discussed case with GI who recommends admitting to medicine for further management.  They will come see as consult.   After the discussed management above, the patient was determined to be safe for discharge.  The patient was in agreement with this plan and all questions regarding their care were answered.  ED return precautions were discussed and the patient will return to the ED with any significant worsening of condition.  Final Clinical Impression(s) / ED Diagnoses Final diagnoses:  Gastrointestinal hemorrhage, unspecified gastrointestinal hemorrhage type  Acute blood loss anemia    Rx / DC Orders ED Discharge Orders     None        Milagros Loll, MD 11/28/20 1503

## 2020-11-28 NOTE — Consult Note (Addendum)
Consultation  Referring Provider: Dr. Stevie Kern    Primary Care Physician:  Patient, No Pcp Per (Inactive) Primary Gastroenterologist:   Gentry Fitz      Reason for Consultation:   Hematochezia         HPI:   Kayla Moody is a 55 y.o. female as listed below as well as osteoarthritis, who presented to the ED on 9/19 with a complaint of hematochezia.    Today, the patient explains that on Friday she started to feel a little bit different and had a decrease in appetite, then Sunday morning when getting out of bed she felt fairly dizzy and passed a small black stool and then continued to have about 4 further bowel movements which were very dark in coloration and looked like blood.  She came to the ED yesterday evening and since then has passed about 2 or 3 more with her last one about 45 minutes ago per her which are still dark in coloration and look like blood.  Along with this describes starting with an epigastric/upper abdominal pain around the same time she started passing the black stools rated as a constant 6-09/2008 and just kind of "achy".  Describes history of reflux symptoms and having to use over-the-counter acid reduction products once daily for a few days every couple of months to get rid of the symptoms.  Denies any recent NSAID use but has been using Tylenol arthritis due to osteoarthritis in her knees.  Associated symptoms include dizziness.    Her husband passed away a year ago.  Daughter very concerned about her.  No previous GI bleeds or endoscopic evaluation.    Denies fever, chills, weight loss, nausea or vomiting.  ED course: Hemoccult positive, hemoglobin 9 (3 years ago 13.5), BUN 25 (3 years ago 11), CT angio abdomen/pelvis with and or without contrast with no CT evidence of active contrast extravasation, no acute abdominal pelvic findings, hepatic steatosis  GI history: None  Past Medical History:  Diagnosis Date   Allergy    Hypertension     Past Surgical History:   Procedure Laterality Date   CHOLECYSTECTOMY     HERNIA REPAIR      Family History: No GI cancers  Social History   Tobacco Use   Smoking status: Never   Smokeless tobacco: Never  Substance Use Topics   Alcohol use: No    Alcohol/week: 0.0 standard drinks   Drug use: No    Prior to Admission medications   Medication Sig Start Date End Date Taking? Authorizing Provider  acetaminophen (TYLENOL) 650 MG CR tablet Take 650-1,300 mg by mouth 2 (two) times daily as needed (for arthritic pain).   Yes [provider]  acetaminophen-codeine (TYLENOL #3) 300-30 MG tablet Take 1 tablet by mouth every 8 (eight) hours as needed for moderate pain. 11/03/20  Yes Cristie Hem, PA-C  amLODipine (NORVASC) 10 MG tablet TAKE 1 TABLET BY MOUTH DAILY Patient taking differently: Take 10 mg by mouth daily. 05/19/18  Yes Sagardia, Eilleen Kempf, MD  cloNIDine (CATAPRES) 0.2 MG tablet Take 0.2 mg by mouth daily as needed (as directed for elevated B/P).   Yes [provider]  diclofenac (VOLTAREN) 75 MG EC tablet Take 1 tablet (75 mg total) by mouth 2 (two) times daily. Patient taking differently: Take 75 mg by mouth 2 (two) times daily as needed (for pain). 03/15/20  Yes Tarry Kos, MD  Lansoprazole (HEARTBURN RELIEF 24 HOUR PO) Take 1 tablet by mouth  daily as needed (for heartburn or reflux).   Yes [provider]  olmesartan-hydrochlorothiazide (BENICAR HCT) 40-12.5 MG tablet Take 1 tablet by mouth daily. 11/10/20  Yes [provider]  diclofenac Sodium (VOLTAREN) 1 % GEL Apply 2 g topically 4 (four) times daily. Patient not taking: No sig reported 06/11/20   Gailen Shelter, PA  hydrochlorothiazide (HYDRODIURIL) 25 MG tablet Take 1 tablet (25 mg total) by mouth daily. Patient not taking: No sig reported 06/28/16 11/28/20  Georgina Quint, MD  lisinopril (PRINIVIL,ZESTRIL) 40 MG tablet Take 1 tablet (40 mg total) by mouth daily. Patient not taking: No sig reported  05/14/17   Georgina Quint, MD  meloxicam (MOBIC) 7.5 MG tablet Take 1 tablet (7.5 mg total) by mouth daily. Patient not taking: No sig reported 06/11/20   Gailen Shelter, PA  methocarbamol (ROBAXIN) 500 MG tablet Take 1 tablet (500 mg total) by mouth 2 (two) times daily. Patient not taking: No sig reported 06/11/20   Gailen Shelter, PA  traMADol (ULTRAM) 50 MG tablet Take 1-2 tablets (50-100 mg total) by mouth at bedtime as needed. Patient not taking: No sig reported 02/12/20   Cristie Hem, PA-C    No current facility-administered medications for this encounter.   Current Outpatient Medications  Medication Sig Dispense Refill   acetaminophen (TYLENOL) 650 MG CR tablet Take 650-1,300 mg by mouth 2 (two) times daily as needed (for arthritic pain).     acetaminophen-codeine (TYLENOL #3) 300-30 MG tablet Take 1 tablet by mouth every 8 (eight) hours as needed for moderate pain. 30 tablet 0   amLODipine (NORVASC) 10 MG tablet TAKE 1 TABLET BY MOUTH DAILY (Patient taking differently: Take 10 mg by mouth daily.) 42 tablet 0   cloNIDine (CATAPRES) 0.2 MG tablet Take 0.2 mg by mouth daily as needed (as directed for elevated B/P).     diclofenac (VOLTAREN) 75 MG EC tablet Take 1 tablet (75 mg total) by mouth 2 (two) times daily. (Patient taking differently: Take 75 mg by mouth 2 (two) times daily as needed (for pain).) 30 tablet 2   Lansoprazole (HEARTBURN RELIEF 24 HOUR PO) Take 1 tablet by mouth daily as needed (for heartburn or reflux).     olmesartan-hydrochlorothiazide (BENICAR HCT) 40-12.5 MG tablet Take 1 tablet by mouth daily.     diclofenac Sodium (VOLTAREN) 1 % GEL Apply 2 g topically 4 (four) times daily. (Patient not taking: No sig reported) 150 g 0   hydrochlorothiazide (HYDRODIURIL) 25 MG tablet Take 1 tablet (25 mg total) by mouth daily. (Patient not taking: No sig reported) 30 tablet 5   lisinopril (PRINIVIL,ZESTRIL) 40 MG tablet Take 1 tablet (40 mg total) by mouth daily.  (Patient not taking: No sig reported) 90 tablet 3   meloxicam (MOBIC) 7.5 MG tablet Take 1 tablet (7.5 mg total) by mouth daily. (Patient not taking: No sig reported) 7 tablet 0   methocarbamol (ROBAXIN) 500 MG tablet Take 1 tablet (500 mg total) by mouth 2 (two) times daily. (Patient not taking: No sig reported) 20 tablet 0   traMADol (ULTRAM) 50 MG tablet Take 1-2 tablets (50-100 mg total) by mouth at bedtime as needed. (Patient not taking: No sig reported) 30 tablet 0    Allergies as of 11/28/2020 - Review Complete 11/28/2020  Allergen Reaction Noted   Penicillins Anaphylaxis and Other (See Comments) 06/08/2015     Review of Systems:    Constitutional: No weight loss, fever or chills Skin: No  rash Cardiovascular: No chest pain Respiratory: No SOB  Gastrointestinal: See HPI and otherwise negative Genitourinary: No dysuria  Neurological: +dizziness Musculoskeletal: No new muscle or joint pain Hematologic: No bruising Psychiatric: No history of depression or anxiety    Physical Exam:  Vital signs in last 24 hours: Temp:  [98.1 F (36.7 C)-99.5 F (37.5 C)] 98.1 F (36.7 C) (09/19 1101) Pulse Rate:  [87-126] 91 (09/19 1333) Resp:  [15-19] 16 (09/19 1333) BP: (118-147)/(54-104) 123/61 (09/19 1333) SpO2:  [99 %-100 %] 100 % (09/19 1333) Weight:  [99.8 kg] 99.8 kg (09/19 1033)   General:   Pleasant Overweight AA female appears to be in NAD, Well developed, Well nourished, alert and cooperative Head:  Normocephalic and atraumatic. Eyes:   PEERL, EOMI. No icterus. Conjunctiva pink. Ears:  Normal auditory acuity. Neck:  Supple Throat: Oral cavity and pharynx without inflammation, swelling or lesion. Lungs: Respirations even and unlabored. Lungs clear to auscultation bilaterally.   No wheezes, crackles, or rhonchi.  Heart: Normal S1, S2. +murmur. Regular rate and rhythm. No peripheral edema, cyanosis or pallor.  Abdomen:  Soft, nondistended, moderate upper abdominal ttp with  involuntary guarding. Normal bowel sounds. No appreciable masses or hepatomegaly. Rectal: External: No abnormality; internal: Scant amount of melenic stool, no mass Msk:  Symmetrical without gross deformities. Peripheral pulses intact.  Extremities:  Without edema, no deformity or joint abnormality.  Neurologic:  Alert and  oriented x4;  grossly normal neurologically.  Skin:   Dry and intact without significant lesions or rashes. Psychiatric: Demonstrates good judgement and reason without abnormal affect or behaviors.   LAB RESULTS: Recent Labs    11/28/20 1021  WBC 10.2  HGB 9.0*  HCT 27.5*  PLT 323   BMET Recent Labs    11/28/20 1021  NA 140  K 3.6  CL 105  CO2 25  GLUCOSE 148*  BUN 25*  CREATININE 0.69  CALCIUM 8.9   LFT Recent Labs    11/28/20 1021  PROT 6.2*  ALBUMIN 3.5  AST 16  ALT 21  ALKPHOS 50  BILITOT 0.5   PT/INR Recent Labs    11/28/20 1118  LABPROT 14.8  INR 1.2    STUDIES: CT Angio Abd/Pel W and/or Wo Contrast  Result Date: 11/28/2020 CLINICAL DATA:  GI bleed.  Lower abdominal pain. EXAM: CTA ABDOMEN AND PELVIS WITHOUT AND WITH CONTRAST TECHNIQUE: Multidetector CT imaging of the abdomen and pelvis was performed using the standard protocol during bolus administration of intravenous contrast. Multiplanar reconstructed images and MIPs were obtained and reviewed to evaluate the vascular anatomy. CONTRAST:  OMNIPAQUE IOHEXOL 350 MG/ML SOLN COMPARISON:  None. FINDINGS: VASCULAR Aorta: Moderate calcified and noncalcified atheromatous disease abdominal aorta without aneurysmal dilation. No dissection, vasculitis or significant stenosis. Celiac: Patent without evidence of aneurysm, dissection, vasculitis or significant stenosis. No CTA evidence of active contrast extravasation. SMA: Patent without evidence of aneurysm, dissection, vasculitis or significant stenosis. No CTA evidence of active contrast extravasation. Renals: Both renal arteries are  patent without evidence of aneurysm, dissection, vasculitis, fibromuscular dysplasia or significant stenosis. IMA: Patent without evidence of aneurysm, dissection, vasculitis or significant stenosis. No CTA evidence of active contrast extravasation. Inflow: Patent without evidence of aneurysm, dissection, vasculitis or significant stenosis. Proximal Outflow: Bilateral common femoral and visualized portions of the superficial and profunda femoral arteries are patent without evidence of aneurysm, dissection, vasculitis or significant stenosis. Veins: No obvious venous abnormality within the limitations of this arterial phase study. Review of the MIP images  confirms the above findings. NON-VASCULAR Lower chest: No acute abnormality. Hepatobiliary: Diffuse hypoattenuation of the liver, consistent with hepatic steatosis. No focal liver abnormality is seen. Status post cholecystectomy. No biliary dilatation. Pancreas: Unremarkable. No pancreatic ductal dilatation or surrounding inflammatory changes. Spleen: Normal in size without focal abnormality. Perihilar accessory spleens. Adrenals/Urinary Tract: Adrenal glands are unremarkable. LEFT inferior renal pole exophytic cyst. Additional subcentimeter RIGHT hypodense renal cortical lesion is too small to characterize likely a small cyst. Kidneys are otherwise normal, without renal calculi, or hydronephrosis. Bladder is unremarkable. Stomach/Bowel: Stomach is within normal limits. Appendix appears normal. No evidence of bowel wall thickening, distention, or inflammatory changes. Lymphatic: No enlarged abdominopelvic. Reproductive: Uterus and bilateral adnexa are unremarkable. Other: No abdominal wall hernia or abnormality. No abdominopelvic ascites. Musculoskeletal: No acute or significant osseous findings. Mild degenerative changes of the imaged spine, greatest at L4-5 with moderate facet arthropathy. IMPRESSION: VASCULAR 1. No CTA evidence of active contrast extravasation.  NON-VASCULAR 1. No acute abdominopelvic findings. 2. Hepatic steatosis. Additional chronic and senescent changes as above. Jon Mugweru, MD [facility] Electronically Signed   By: Jon  Mugweru M.D.   On: 11/28/2020 12:40     Impression / Plan:   Impression: 1.  Melena: Hemoglobin 9 (normal 3 years ago), CT angio negative, BUN mildly elevated, patient reports starting with black stool yesterday morning 9/18, has passed about 6-7 stools since then with the last around 2/230 this afternoon, scant amount of melenic stool on rectal exam today, Hemoccult positive, history of reflux; consider gastritis versus PUD versus other upper GI source of bleeding 2.  Anemia: Hemoglobin 9 (13.5 on 05/14/2017), from above 3.  Hypertension 4.  Osteoarthritis: Describes using Tylenol only, no NSAIDs  Plan: 1.  Plan for EGD tomorrow with Dr. Welborn Keena.  Did discuss risks, benefits, limitations and alternatives with the patient and she agrees to proceed. 2.  Patient will be on clear liquid diet today and n.p.o. at midnight 3.  We will start patient on Pantoprazole 40 mg p.o. twice daily 4.  Continue to monitor hemoglobin q. 6-8 hours with transfusion as needed less than 7 5.  Did discuss with patient that if we do not find an upper GI source for bleeding we may need to do a colonoscopy this admission, if we do not end up needing a colonoscopy this admission she will have to follow-up with us as an outpatient as she is overdue for screening  Thank you for your kind consultation, we will continue to follow.  Jennifer Lynne Lemmon  11/28/2020, 2:27 PM     [facility] Attending   I have taken an interval history, reviewed the chart and examined the patient. I agree with the Advanced Practitioner's note, impression and recommendations.  Majority the medical decision-making in the formulation of the assessment and plan were performed by me.  I saw the patient  yesterday.  Latausha Flamm E. Anett Ranker, MD, FACG [facility] 11/29/2020 8:10 AM  

## 2020-11-28 NOTE — ED Notes (Signed)
MD paged at this time for diet order. Pt remains NPO until MD orders a diet at this time.

## 2020-11-28 NOTE — ED Notes (Signed)
Phlebotomist is coming to draw remaining lab orders. Will continue to monitor.

## 2020-11-28 NOTE — ED Triage Notes (Signed)
Pt here with c/o Gi bleed that started last night , bright red blood , with c/o  light headed

## 2020-11-28 NOTE — H&P (Addendum)
[facility] Admission History and Physical Service Pager: [phone]  Patient name: Kayla Moody Medical record number: [id number] Date of birth: February 15, 1966 Age: 55 y.o. Gender: female  Primary Care Provider: Patient, No Pcp Per (Inactive) Consultants: GI Code Status: Full  Preferred Emergency Contact: Daughter Daniel Nones [phone]  Chief Complaint: Bloody Stools  Assessment and Plan: Kayla Moody is a 55 y.o. female presenting with bloody stools . PMH is significant for HTN, GERD, OA, Heart Murmur  GI Bleed  Symptomatic anemia  Patient presented to the ED with a history of having bloody stools since yesterday night. She said it started as a dark bowel movement which then progressed to more maroon color. She has had about 6 stools that are like this bloody diarrhea. (See media below).  She says it does not hurt her when she goes to the bathroom.  She has had history of straining, but has not had that recently. She has also been feeling lightheaded since yesterday and says she has not been able to eat very much besides a Gyro at a festival that she had gone to and half of a peanut butter sandwich since Friday. She says she feels sick when she is going to eat since Friday, but has not thrown up. She took 2 aspirin from her friend who works in a nursing home, but she is unsure of whether it actually was aspirin. She was prescribed diclofenac tablets which she had taken once a day. She denied any Goody powders or ibuprofen use.  She had been taking Tylenol for her knee osteoarthritis about 1-2 times a day. She does not take any blood thinners. She has a history of having alternating constipation and diarrhea for the past several years. She has never seen a GI doctor before.  Has never had any endoscopy or colonoscopies. She said she was afraid of going to her colonoscopy 3 years ago and never rescheduled.  Denies any recent travels or sick symptoms.  In the ED  she was tachycardic to 126, and was given 1 L normal saline bolus.Was also given 1 time dose of fentanyl for pain in ED. She had a Hgb of 9.0 in the ED with previous hemoglobin of 13.5 in 2019.  Electrolytes were normal.  Normal platelets.  PT/INR was normal at 14.8 and 1.2. FOBT positive. Initially tachycardic to 126, improved to 80s to 90s with 1L NS bolus.  MAPs are greater than 79, no hypotension. CTA was performed which showed no active contrast extravasation or acute abdominal pelvic issues.  On examination had mild tenderness to palpation midline and epigastric region.  Described feeling "sore" all over her abdomen, but not as intense pain. Ddx most likely symptoms related to upper GI bleed given darker red colored stools, history of GERD and epigastric pain on palpation. H pylori ordered as well. Infectious hemorrhagic diarrhea possible such as EHEC or Shigella possible although less likely, denies any sick symptoms or recent travel but has had some festival food. IBD such as ulcerative colitis possible but less likely given seems more like an isolated event, no family history she knows of GI issues. Mallory weiss tear possible but less likely given no vomiting she has had and alcohol use was only 1 glass of wine a month in her past. Very possible that bleed is from gastric or duodenal ulcer.  -Admit to FPTS attending Dr. Leveda Anna -GI consulted, appreciate recommendations -Vital signs per floor protocol -f/u repeat CBC at 6pm -f/u am CBC -continuous  cardiac telemetry -Pulse oximetry, keep oxygen levels above 92% -Monitor hemoglobin on CBC's -GI pathogen panel -clear fluid diet  -NPO at midnight for EGD tomorrow morning -Hemoglobin threshold of 7, transfuse less than this -Iron and TIBC -H. pylori antigen stool -Ferritin -Protonix twice daily -MiraLAX daily -SCDs for DVT prophylaxis -2 large-bore IVs -transfusion threshold of 7 -PT/OT to evaluate and treat -Enteric  precautions  HTN Last took blood pressure medications yesterday morning, home meds include lisinopril-HCTZ combo and amlodipine. -Hold in the setting of blood loss and soft BPs -Monitor blood pressures -restart home antihypertensives as able  GERD Says that she takes over-the-counter acid blockers as needed. -Protonix 40 mg twice daily while in hospital  OA Home medications include meloxicam and diclofenac. Has had osteoarthritis in the knees, bilaterally.  She is being seen by orthopedics, and has gotten cortisone injections for this before.  Currently taking Tylenol for this.  Hyperglycemia Glucose on admission 148, most recent A1c 6.2 in 2019.  -Monitor CBGs -f/u A1c   Heart Murmur 2/6 systolic murmur in aortic region, patient aware of this and states that she has had it for 2 years now. This has been noted since 06/2016. Referral was placed to cardiology outpatient however unsure if patient followed up. -Echo ordered  Hepatic steatosis Noted on CTA on admission. Reassuringly AST and ALT are normalized. -monitor CMP as appropriate -diet and exercise counseling outpatient   FEN/GI: clear fluids then NPO at midnight pending EGD Prophylaxis: SCDs  Disposition: Med telemetry, admit to attending Dr. Leveda Anna   History of Present Illness:  Kayla Moody is a 55 y.o. female presenting with bloody stools.  Yesterday evening when she was getting ready to work she noticed she was lightheaded and then went to the restroom and noticed blood when she wiped. It was darker at the first and then progressed to a more maroon red (see media). She denies any changes except that she went to the fair with her children and noticed she was overheated. She went back home, and got a few hours of sleep before this bleeding started. She had a headache yesterday and her coworker gave her two aspirin pills although she is unsure if this was actually aspirin. She denies any pain but does have some soreness in  her epigastric region. Consistency of stools is watery and has been runny since yesterday evening. Noticed blood with each bowel movement since it started and has been around 6 bloody BM. Prior to this episode, her BM pattern ranges between normal, soft, formed stools, diarrhea and constipation.  She denies fever, chills, nausea, and vomiting. She is only endorsing lightheadedness and no pain during BM. She takes tylenol for arthritis, takes it about 1-2 times a day. Denies ibuprofen or goody powder consumption. Denies any recent travel. Denies dyspnea and chest pain.  Denies any recent abdominal or other surgical procedures. Denies sick contacts and no one else has symptoms.   She has not eaten since Friday but staying hydrated. Bought a gyro at the fair but was not able to eat it during the fair because she felt sick. Walked to the kitchen and got dizzy so she just ate half of a peanut butter sandwich. Dizziness also started last night, never had this before. She denies vision changes and never had a bleed before. She denies hematemesis or vomiting.   She has never had a colonoscopy or EGD. She denies history of colon cancer in family and denies abdominal pain. She has a history of  irregular BM, sometimes ranges between multiple times a day to once every 4 days. She strains at times and takes a fiber gummy to help with this. She denies having hemorrhoids before. She has not had any dysuria or urinary symptoms. She has never seen GI before. She was anemic years ago but was told that it resolved with her hemoglobin being normal.  Denies any current tobacco use, alcohol use and illicit drug.  Has had mild alcohol use in the past with only drinking one glass of wine each month.  Review Of Systems: Per HPI with the following additions:   Review of Systems  Constitutional:  Positive for appetite change. Negative for chills and fever.  HENT:  Negative for sore throat and trouble swallowing.   Eyes:   Negative for visual disturbance.  Respiratory:  Negative for chest tightness and shortness of breath.   Cardiovascular:  Negative for chest pain.  Gastrointestinal:  Positive for blood in stool, diarrhea and nausea. Negative for abdominal distention.  Genitourinary:  Negative for difficulty urinating and dysuria.  Neurological:  Positive for dizziness.    Patient Active Problem List   Diagnosis Date Noted   Symptomatic anemia 11/28/2020   Bilateral primary osteoarthritis of knee 03/06/2018   Primary localized osteoarthritis of knees, bilateral 04/23/2017   Chronic tension-type headache, intractable 10/22/2016   Essential hypertension 06/28/2016   Heart murmur 06/28/2016    Past Medical History: Past Medical History:  Diagnosis Date   Allergy    Hypertension     Past Surgical History: Past Surgical History:  Procedure Laterality Date   CHOLECYSTECTOMY     HERNIA REPAIR      Social History: Social History   Tobacco Use   Smoking status: Never   Smokeless tobacco: Never  Substance Use Topics   Alcohol use: No    Alcohol/week: 0.0 standard drinks   Drug use: No   Additional social history:   Please also refer to relevant sections of EMR.  Family History: No family history on file. (If not completed, MUST add something in)  Allergies and Medications: Allergies  Allergen Reactions   Penicillins Anaphylaxis and Other (See Comments)    Family history of severe allergic reactions to this, so the patient does not attempt it   No current facility-administered medications on file prior to encounter.   Current Outpatient Medications on File Prior to Encounter  Medication Sig Dispense Refill   acetaminophen (TYLENOL) 650 MG CR tablet Take 650-1,300 mg by mouth 2 (two) times daily as needed (for arthritic pain).     acetaminophen-codeine (TYLENOL #3) 300-30 MG tablet Take 1 tablet by mouth every 8 (eight) hours as needed for moderate pain. 30 tablet 0   amLODipine  (NORVASC) 10 MG tablet TAKE 1 TABLET BY MOUTH DAILY (Patient taking differently: Take 10 mg by mouth daily.) 42 tablet 0   cloNIDine (CATAPRES) 0.2 MG tablet Take 0.2 mg by mouth daily as needed (as directed for elevated B/P).     diclofenac (VOLTAREN) 75 MG EC tablet Take 1 tablet (75 mg total) by mouth 2 (two) times daily. (Patient taking differently: Take 75 mg by mouth 2 (two) times daily as needed (for pain).) 30 tablet 2   Lansoprazole (HEARTBURN RELIEF 24 HOUR PO) Take 1 tablet by mouth daily as needed (for heartburn or reflux).     olmesartan-hydrochlorothiazide (BENICAR HCT) 40-12.5 MG tablet Take 1 tablet by mouth daily.     diclofenac Sodium (VOLTAREN) 1 % GEL Apply 2 g topically  4 (four) times daily. (Patient not taking: No sig reported) 150 g 0   hydrochlorothiazide (HYDRODIURIL) 25 MG tablet Take 1 tablet (25 mg total) by mouth daily. (Patient not taking: No sig reported) 30 tablet 5   lisinopril (PRINIVIL,ZESTRIL) 40 MG tablet Take 1 tablet (40 mg total) by mouth daily. (Patient not taking: No sig reported) 90 tablet 3   meloxicam (MOBIC) 7.5 MG tablet Take 1 tablet (7.5 mg total) by mouth daily. (Patient not taking: No sig reported) 7 tablet 0   methocarbamol (ROBAXIN) 500 MG tablet Take 1 tablet (500 mg total) by mouth 2 (two) times daily. (Patient not taking: No sig reported) 20 tablet 0   traMADol (ULTRAM) 50 MG tablet Take 1-2 tablets (50-100 mg total) by mouth at bedtime as needed. (Patient not taking: No sig reported) 30 tablet 0    Objective: BP 123/61 (BP Location: Right Arm)   Pulse 90   Temp 98.1 F (36.7 C) (Oral)   Resp 16   Ht 5\' 5"  (1.651 m)   Wt 99.8 kg   SpO2 100%   BMI 36.61 kg/m  Exam: General: NAD, laying down Head: Normocephalic atraumatic Neck: ROM intact Cardiovascular: 2/6 systolic murmur in aortic region, NSR Respiratory: CTAB no iWOB Gastrointestinal: Mild tenderness to palpation of epigastric region, soft, non-distended MSK: Good tone Derm: No  rashes visualized Neuro: No focal deficits Psych: Mood appropriate   Labs and Imaging: CBC BMET  Recent Labs  Lab 11/28/20 1021  WBC 10.2  HGB 9.0*  HCT 27.5*  PLT 323   Recent Labs  Lab 11/28/20 1021  NA 140  K 3.6  CL 105  CO2 25  BUN 25*  CREATININE 0.69  GLUCOSE 148*  CALCIUM 8.9     EKG: sinus tachycardia   CT Angio Abd/Pel W and/or Wo Contrast  Result Date: 11/28/2020 CLINICAL DATA:  GI bleed.  Lower abdominal pain. EXAM: CTA ABDOMEN AND PELVIS WITHOUT AND WITH CONTRAST TECHNIQUE: Multidetector CT imaging of the abdomen and pelvis was performed using the standard protocol during bolus administration of intravenous contrast. Multiplanar reconstructed images and MIPs were obtained and reviewed to evaluate the vascular anatomy. CONTRAST:  11/30/2020 OMNIPAQUE IOHEXOL 350 MG/ML SOLN COMPARISON:  None. FINDINGS: VASCULAR Aorta: Moderate calcified and noncalcified atheromatous disease abdominal aorta without aneurysmal dilation. No dissection, vasculitis or significant stenosis. Celiac: Patent without evidence of aneurysm, dissection, vasculitis or significant stenosis. No CTA evidence of active contrast extravasation. SMA: Patent without evidence of aneurysm, dissection, vasculitis or significant stenosis. No CTA evidence of active contrast extravasation. Renals: Both renal arteries are patent without evidence of aneurysm, dissection, vasculitis, fibromuscular dysplasia or significant stenosis. IMA: Patent without evidence of aneurysm, dissection, vasculitis or significant stenosis. No CTA evidence of active contrast extravasation. Inflow: Patent without evidence of aneurysm, dissection, vasculitis or significant stenosis. Proximal Outflow: Bilateral common femoral and visualized portions of the superficial and profunda femoral arteries are patent without evidence of aneurysm, dissection, vasculitis or significant stenosis. Veins: No obvious venous abnormality within the limitations of  this arterial phase study. Review of the MIP images confirms the above findings. NON-VASCULAR Lower chest: No acute abnormality. Hepatobiliary: Diffuse hypoattenuation of the liver, consistent with hepatic steatosis. No focal liver abnormality is seen. Status post cholecystectomy. No biliary dilatation. Pancreas: Unremarkable. No pancreatic ductal dilatation or surrounding inflammatory changes. Spleen: Normal in size without focal abnormality. Perihilar accessory spleens. Adrenals/Urinary Tract: Adrenal glands are unremarkable. LEFT inferior renal pole exophytic cyst. Additional subcentimeter RIGHT hypodense renal cortical lesion  is too small to characterize likely a small cyst. Kidneys are otherwise normal, without renal calculi, or hydronephrosis. Bladder is unremarkable. Stomach/Bowel: Stomach is within normal limits. Appendix appears normal. No evidence of bowel wall thickening, distention, or inflammatory changes. Lymphatic: No enlarged abdominopelvic. Reproductive: Uterus and bilateral adnexa are unremarkable. Other: No abdominal wall hernia or abnormality. No abdominopelvic ascites. Musculoskeletal: No acute or significant osseous findings. Mild degenerative changes of the imaged spine, greatest at L4-5 with moderate facet arthropathy. IMPRESSION: VASCULAR 1. No CTA evidence of active contrast extravasation. NON-VASCULAR 1. No acute abdominopelvic findings. 2. Hepatic steatosis. Additional chronic and senescent changes as above. Roanna Banning, MD [facility] Radiology Electronically Signed   By: Roanna Banning M.D.   On: 11/28/2020 12:40     Levin Erp, MD 11/28/2020, 3:54 PM PGY-1, [facility] FPTS Intern pager: [phone], text pages welcome   I was personally present and performed or re-performed the history, physical exam and medical decision making activities of this service and have verified that the service and findings are  accurately documented in the resident's note. My edits are noted within the note, please also see attending's attestation.   Reece Leader, DO                  11/28/2020, 7:45 PM  PGY-2, [facility]

## 2020-11-28 NOTE — ED Notes (Addendum)
Pt returned from CT. On monitors 

## 2020-11-29 ENCOUNTER — Inpatient Hospital Stay (HOSPITAL_COMMUNITY): Payer: 59 | Admitting: Anesthesiology

## 2020-11-29 ENCOUNTER — Encounter (HOSPITAL_COMMUNITY): Payer: Self-pay | Admitting: Family Medicine

## 2020-11-29 ENCOUNTER — Inpatient Hospital Stay (HOSPITAL_COMMUNITY): Payer: 59

## 2020-11-29 ENCOUNTER — Encounter (HOSPITAL_COMMUNITY)
Admission: EM | Disposition: A | Discharge status: Home / Self Care | Payer: Self-pay | Source: Home / Self Care | Attending: Family Medicine

## 2020-11-29 DIAGNOSIS — K2981 Duodenitis with bleeding: Secondary | ICD-10-CM | POA: Diagnosis present

## 2020-11-29 DIAGNOSIS — K921 Melena: Secondary | ICD-10-CM | POA: Diagnosis present

## 2020-11-29 DIAGNOSIS — K922 Gastrointestinal hemorrhage, unspecified: Secondary | ICD-10-CM

## 2020-11-29 DIAGNOSIS — R011 Cardiac murmur, unspecified: Secondary | ICD-10-CM | POA: Diagnosis not present

## 2020-11-29 HISTORY — PX: ESOPHAGOGASTRODUODENOSCOPY (EGD) WITH PROPOFOL: SHX5813

## 2020-11-29 HISTORY — PX: BIOPSY: SHX5522

## 2020-11-29 LAB — CBC WITH DIFFERENTIAL/PLATELET
Abs Immature Granulocytes: 0.1 10*3/uL — ABNORMAL HIGH (ref 0.00–0.07)
Basophils Absolute: 0.1 10*3/uL (ref 0.0–0.1)
Basophils Relative: 1 %
Eosinophils Absolute: 0.7 10*3/uL — ABNORMAL HIGH (ref 0.0–0.5)
Eosinophils Relative: 6 %
HCT: 21.2 % — ABNORMAL LOW (ref 36.0–46.0)
Hemoglobin: 7 g/dL — ABNORMAL LOW (ref 12.0–15.0)
Immature Granulocytes: 1 %
Lymphocytes Relative: 48 %
Lymphs Abs: 5 10*3/uL — ABNORMAL HIGH (ref 0.7–4.0)
MCH: 30 pg (ref 26.0–34.0)
MCHC: 33 g/dL (ref 30.0–36.0)
MCV: 91 fL (ref 80.0–100.0)
Monocytes Absolute: 0.7 10*3/uL (ref 0.1–1.0)
Monocytes Relative: 7 %
Neutro Abs: 3.8 10*3/uL (ref 1.7–7.7)
Neutrophils Relative %: 37 %
Platelets: 266 10*3/uL (ref 150–400)
RBC: 2.33 MIL/uL — ABNORMAL LOW (ref 3.87–5.11)
RDW: 12.4 % (ref 11.5–15.5)
WBC: 10.3 10*3/uL (ref 4.0–10.5)
nRBC: 0 % (ref 0.0–0.2)

## 2020-11-29 LAB — HEMOGLOBIN A1C
Hgb A1c MFr Bld: 5.7 % — ABNORMAL HIGH (ref 4.8–5.6)
Mean Plasma Glucose: 116.89 mg/dL

## 2020-11-29 LAB — COMPREHENSIVE METABOLIC PANEL
ALT: 17 U/L (ref 0–44)
AST: 15 U/L (ref 15–41)
Albumin: 3 g/dL — ABNORMAL LOW (ref 3.5–5.0)
Alkaline Phosphatase: 40 U/L (ref 38–126)
Anion gap: 6 (ref 5–15)
BUN: 12 mg/dL (ref 6–20)
CO2: 25 mmol/L (ref 22–32)
Calcium: 8.2 mg/dL — ABNORMAL LOW (ref 8.9–10.3)
Chloride: 108 mmol/L (ref 98–111)
Creatinine, Ser: 0.54 mg/dL (ref 0.44–1.00)
GFR, Estimated: >60 mL/min (ref >60)
Glucose, Bld: 113 mg/dL — ABNORMAL HIGH (ref 70–99)
Potassium: 3.7 mmol/L (ref 3.5–5.1)
Sodium: 139 mmol/L (ref 135–145)
Total Bilirubin: 0.4 mg/dL (ref 0.3–1.2)
Total Protein: 5.5 g/dL — ABNORMAL LOW (ref 6.5–8.1)

## 2020-11-29 LAB — ECHOCARDIOGRAM COMPLETE
AR max vel: 1.57 cm2
AV Area VTI: 1.56 cm2
AV Area mean vel: 1.52 cm2
AV Mean grad: 11 mmHg
AV Peak grad: 19 mmHg
Ao pk vel: 2.18 m/s
Area-P 1/2: 3.93 cm2
Height: 65 in
MV VTI: 1.83 cm2
S' Lateral: 2.3 cm
Weight: 3841.3 oz

## 2020-11-29 LAB — HEMOGLOBIN
Hemoglobin: 8.7 g/dL — ABNORMAL LOW (ref 12.0–15.0)
Hemoglobin: 8.9 g/dL — ABNORMAL LOW (ref 12.0–15.0)
Hemoglobin: 9.2 g/dL — ABNORMAL LOW (ref 12.0–15.0)

## 2020-11-29 LAB — PREPARE RBC (CROSSMATCH)

## 2020-11-29 LAB — CLOTEST (H. PYLORI), BIOPSY: Helicobacter screen: POSITIVE — AB

## 2020-11-29 SURGERY — ESOPHAGOGASTRODUODENOSCOPY (EGD) WITH PROPOFOL
Anesthesia: Monitor Anesthesia Care

## 2020-11-29 MED ORDER — AMLODIPINE BESYLATE 10 MG PO TABS
10.0000 mg | ORAL_TABLET | Freq: Every day | ORAL | Status: DC
  Administered 2020-11-29 – 2020-11-30 (×2): 10 mg via ORAL
  Filled 2020-11-29 (×2): qty 1

## 2020-11-29 MED ORDER — LIDOCAINE 2% (20 MG/ML) 5 ML SYRINGE
INTRAMUSCULAR | Status: DC | PRN
  Administered 2020-11-29: 60 mg via INTRAVENOUS
  Administered 2020-11-29: 40 mg via INTRAVENOUS

## 2020-11-29 MED ORDER — IRBESARTAN 300 MG PO TABS
300.0000 mg | ORAL_TABLET | Freq: Every day | ORAL | Status: DC
  Administered 2020-11-29 – 2020-11-30 (×2): 300 mg via ORAL
  Filled 2020-11-29 (×2): qty 1

## 2020-11-29 MED ORDER — PANTOPRAZOLE SODIUM 40 MG PO TBEC: 40.0000 mg | DELAYED_RELEASE_TABLET | Freq: Two times a day (BID) | ORAL | Status: DC

## 2020-11-29 MED ORDER — HYDROCHLOROTHIAZIDE 12.5 MG PO CAPS
12.5000 mg | ORAL_CAPSULE | Freq: Every day | ORAL | Status: DC
  Administered 2020-11-29 – 2020-11-30 (×2): 12.5 mg via ORAL
  Filled 2020-11-29 (×2): qty 1

## 2020-11-29 MED ORDER — PANTOPRAZOLE SODIUM 40 MG IV SOLR: 40.0000 mg | Freq: Two times a day (BID) | INTRAVENOUS | Status: DC

## 2020-11-29 MED ORDER — PROPOFOL 500 MG/50ML IV EMUL
INTRAVENOUS | Status: DC | PRN
  Administered 2020-11-29: 75 ug/kg/min via INTRAVENOUS

## 2020-11-29 MED ORDER — MIDAZOLAM HCL 2 MG/2ML IJ SOLN
INTRAMUSCULAR | Status: DC | PRN
Start: 2020-11-29 — End: 2020-11-29
  Administered 2020-11-29: 2 mg via INTRAVENOUS

## 2020-11-29 MED ORDER — PROPOFOL 10 MG/ML IV BOLUS
INTRAVENOUS | Status: DC | PRN
  Administered 2020-11-29: 20 mg via INTRAVENOUS
  Administered 2020-11-29: 30 mg via INTRAVENOUS
  Administered 2020-11-29: 20 mg via INTRAVENOUS

## 2020-11-29 MED ORDER — PANTOPRAZOLE SODIUM 40 MG PO TBEC
40.0000 mg | DELAYED_RELEASE_TABLET | Freq: Two times a day (BID) | ORAL | Status: DC
  Administered 2020-11-29 – 2020-11-30 (×3): 40 mg via ORAL
  Filled 2020-11-29 (×3): qty 1

## 2020-11-29 MED ORDER — OLMESARTAN MEDOXOMIL-HCTZ 40-12.5 MG PO TABS: 1.0000 | ORAL_TABLET | Freq: Every day | ORAL | Status: DC

## 2020-11-29 MED ORDER — SODIUM CHLORIDE 0.9% IV SOLUTION: Freq: Once | INTRAVENOUS | Status: AC

## 2020-11-29 MED ORDER — LACTATED RINGERS IV SOLN: INTRAVENOUS | Status: DC

## 2020-11-29 SURGICAL SUPPLY — 14 items

## 2020-11-29 NOTE — Anesthesia Postprocedure Evaluation (Signed)
Anesthesia Post Note  Patient: Kayla Moody  Procedure(s) Performed: ESOPHAGOGASTRODUODENOSCOPY (EGD) WITH PROPOFOL BIOPSY     Patient location during evaluation: PACU Anesthesia Type: MAC Level of consciousness: awake and alert and oriented Pain management: pain level controlled Vital Signs Assessment: post-procedure vital signs reviewed and stable Respiratory status: spontaneous breathing, nonlabored ventilation and respiratory function stable Cardiovascular status: stable and blood pressure returned to baseline Postop Assessment: no apparent nausea or vomiting Anesthetic complications: no   No notable events documented.  Last Vitals:  Vitals:   11/29/20 0924 11/29/20 1002  BP: (!) 146/79 (!) 141/80  Pulse:  (!) 115  Resp:  (!) 21  Temp:    SpO2:  100%    Last Pain:  Vitals:   11/29/20 1002  TempSrc:   PainSc: 0-No pain                 [name] A.

## 2020-11-29 NOTE — Op Note (Signed)
[facility] Patient Name: Kayla Moody Procedure Date : 11/29/2020 MRN: [id number] Attending MD: Iva Boop , MD Date of Birth: 12/15/1965 CSN: [id number] Age: 55 Admit Type: Inpatient Procedure:                Upper GI endoscopy Indications:              Melena, Suspected upper gastrointestinal bleeding Providers:                Iva Boop, MD, Norman Clay, RN, Alan Ripper Referring MD:              Medicines:                Propofol per Anesthesia, Monitored Anesthesia Care Complications:            No immediate complications. Estimated Blood Loss:     Estimated blood loss was minimal. Procedure:                Pre-Anesthesia Assessment:                           - Prior to the procedure, a History and Physical                            was performed, and patient medications and                            allergies were reviewed. The patient's tolerance of                            previous anesthesia was also reviewed. The risks                            and benefits of the procedure and the sedation                            options and risks were discussed with the patient.                            All questions were answered, and informed consent                            was obtained. Prior Anticoagulants: The patient has                            taken no previous anticoagulant or antiplatelet                            agents. ASA Grade Assessment: III - A patient with                            severe systemic disease. After reviewing the risks                            and benefits, the patient was deemed in  satisfactory condition to undergo the procedure.                           After obtaining informed consent, the endoscope was                            passed under direct vision. Throughout the                            procedure, the patient's blood pressure, pulse, and                            oxygen  saturations were monitored continuously. The                            [id number]-[id number] ([id number]) Olympus endoscope was introduced                            through the mouth, and advanced to the second part                            of duodenum. The upper GI endoscopy was                            accomplished without difficulty. The patient                            tolerated the procedure well. Scope In: Scope Out: Findings:      Multiple diffuse erosions with stigmata of recent bleeding were found in       the duodenal bulb. Biopsies were taken with a cold forceps for       histology. Verification of patient identification for the specimen was       done. Estimated blood loss was minimal.      Diffuse mild inflammation characterized by friability and granularity       was found in the gastric antrum. Biopsies were taken with a cold forceps       for Helicobacter pylori testing using CLOtest. Verification of patient       identification for the specimen was done. Estimated blood loss was       minimal.      The gastroesophageal flap valve was visualized endoscopically and       classified as Hill Grade III (minimal fold, loose to endoscope, hiatal       hernia likely).      A B ring was found in the lower third of the esophagus.      The exam was otherwise without abnormality.      The cardia and gastric fundus were normal on retroflexion. Impression:               - Duodenal erosions with stigmata of recent                            bleeding. Biopsied.                           - Mucosal  changes suspicious for gastritis.                            Biopsied.                           - Gastroesophageal flap valve classified as Hill                            Grade III (minimal fold, loose to endoscope, hiatal                            hernia likely).                           - B ring in the lower third of the esophagus.                            Non-obstructing                            - The examination was otherwise normal. Recommendation:           - Return patient to hospital ward for ongoing care.                           - Resume regular diet.                           - Changed PPI to oral high dose bid for now                           Await pathology - suspect benign disease, ? H                            pylori (said no NSAID's)                           Will f/u and arrange dc info tomorrow - if ok                            tomorrow possible dc Procedure Code(s):        --- Professional ---                           [phone], Esophagogastroduodenoscopy, flexible,                            transoral; with biopsy, single or multiple Diagnosis Code(s):        --- Professional ---                           K26.4, Chronic or unspecified duodenal ulcer with                            hemorrhage  K31.89, Other diseases of stomach and duodenum                           Q39.4, Esophageal web                           K92.1, Melena (includes Hematochezia) CPT copyright 2019 [employer]. All rights reserved. The codes documented in this report are preliminary and upon coder review may  be revised to meet current compliance requirements. Iva Boop, MD 11/29/2020 88:32:54 AM This report has been signed electronically. Number of Addenda: 0

## 2020-11-29 NOTE — Interval H&P Note (Signed)
History and Physical Interval Note:  11/29/2020 9:40 AM  Kayla Moody  has presented today for surgery, with the diagnosis of Melena.  The various methods of treatment have been discussed with the patient and family. After consideration of risks, benefits and other options for treatment, the patient has consented to  Procedure(s): ESOPHAGOGASTRODUODENOSCOPY (EGD) WITH PROPOFOL (N/A) as a surgical intervention.  The patient's history has been reviewed, patient examined, no change in status, stable for surgery.  I have reviewed the patient's chart and labs.  Questions were answered to the patient's satisfaction.     Stan Head

## 2020-11-29 NOTE — TOC Initial Note (Signed)
Transition of Care [facility]) - Initial/Assessment Note    Patient Details  Name: Kayla Moody MRN: [id number] Date of Birth: [birth year]  Transition of [facility]) CM/SW Contact:    Tom-Johnson, Renea Ee, RN Phone Number: 11/29/2020, 1:23 PM  Clinical Narrative:                 CM spoke with patient at bedside about TOC needs for post hospital transition. States she lives alone as her husband is deceased. Has two children, mother alive and never met dad. Independent prior to hospitalization.Drives self to and from appointments and does her own errands. Has a walker at home that belonged to her husband but dies not need one. Denies any financial needs at this time. Family supportive and will transport at discharge. CM will continue to follow with TOC needs.   Expected Discharge Plan: Home/Self Care Barriers to Discharge: Continued Medical Work up   Patient Goals and CMS Choice Patient states their goals for this hospitalization and ongoing recovery are:: To go home CMS Medicare.gov Compare Post Acute Care list provided to:: Patient    Expected Discharge Plan and Services Expected Discharge Plan: Home/Self Care   Discharge Planning Services: CM Consult   Living arrangements for the past 2 months: Apartment                 DME Arranged: N/A DME Agency: NA       HH Arranged: NA HH Agency: NA        Prior Living Arrangements/Services Living arrangements for the past 2 months: Apartment Lives with:: Self Patient language and need for interpreter reviewed:: Yes Do you feel safe going back to the place where you live?: Yes      Need for Family Participation in Patient Care: Yes (Comment) Care giver support system in place?: Yes (comment) Current home services: DME [facility]) Criminal Activity/Legal Involvement Pertinent to Current Situation/Hospitalization: No - Comment as needed  Activities of Daily Living Home Assistive Devices/Equipment: None ADL Screening (condition  at time of admission) Patient's cognitive ability adequate to safely complete daily activities?: Yes Is the patient deaf or have difficulty hearing?: No Does the patient have difficulty seeing, even when wearing glasses/contacts?: No Does the patient have difficulty concentrating, remembering, or making decisions?: No Patient able to express need for assistance with ADLs?: Yes Does the patient have difficulty dressing or bathing?: No Independently performs ADLs?: Yes (appropriate for developmental age) Does the patient have difficulty walking or climbing stairs?: No Weakness of Legs: Both Weakness of Arms/Hands: Both  Permission Sought/Granted Permission sought to share information with : Case Manager, Family Supports Permission granted to share information with : Yes, Verbal Permission Granted              Emotional Assessment Appearance:: Appears stated age Attitude/Demeanor/Rapport: Engaged Affect (typically observed): Accepting, Appropriate, Hopeful Orientation: : Oriented to Self, Oriented to Place, Oriented to  Time, Oriented to Situation Alcohol / Substance Use: Not Applicable Psych Involvement: No (comment)  Admission diagnosis:  Acute blood loss anemia [D62] Symptomatic anemia [D64.9] Gastrointestinal hemorrhage, unspecified gastrointestinal hemorrhage type [K92.2] Patient Active Problem List   Diagnosis Date Noted   Melena    Acute upper GI bleed    Duodenitis with hemorrhage    Symptomatic anemia 11/28/2020   Acute blood loss anemia    Bilateral primary osteoarthritis of knee 03/06/2018   Primary localized osteoarthritis of knees, bilateral 04/23/2017   Chronic tension-type headache, intractable 10/22/2016   Essential hypertension 06/28/2016  Heart murmur 06/28/2016   PCP:  Patient, No Pcp Per (Inactive) Pharmacy:   [id number] [place], [place] - [place] [address] [place] [address] Phone: [phone]  Fax: [place] [id number] [place], [place] - [address] [place] [address] [employer] [place] [address] Phone: [phone] Fax: [phone]     Social Determinants of Health (SDOH) Interventions    Readmission Risk Interventions No flowsheet data found.

## 2020-11-29 NOTE — Progress Notes (Signed)
FPTS Brief Progress Note  S: Sleeping in bed.    O: BP 129/64 (BP Location: Left Arm)   Pulse 94   Temp 98.3 F (36.8 C)   Resp 18   Ht 5\' 5"  (1.651 m)   Wt 108.9 kg   SpO2 99%   BMI 39.95 kg/m   General: Lying in bed sleeping soundly, no acute distress. Age appropriate. Respiratory: normal effort   A/P: 1. Gastrointestinal hemorrhage, unspecified gastrointestinal hemorrhage type  2. Acute blood loss anemia  - Orders reviewed. Labs for AM ordered, which was adjusted as needed.   , DO 11/29/2020, 11:48 PM PGY-3, [facility] Night Resident  Please page [phone] with questions.

## 2020-11-29 NOTE — Hospital Course (Addendum)
GI Bleed  Symptomatic anemia Patient presented with symptomatic anemia, likely secondary to GI bleed with EGD showcasing duodenal erosions with stigmata of recent bleeding and mucosal changes which may be gastritis. Endoscopy biopsy positive for H. Pylori, will initiate quadruple therapy omeprazole 20 mg BID x 14 days, Pepto Bismol 2 tab (262 mg each) four times a day x 14 days, Metronidazole 250 mg 4 times a day x 14 days, and Doxycycline 100 mg BID x 14 days. During the hospital course the patient had a hemoglobin of 7, transfused 1 unit pRBC. Hemoglobin trending upwards with 8.8 on discharge. PT/OT evaluated patient and had no further recommendations.    HTN Home meds including Lisinopril-HCTZ combo 40 mg and Amlodipine 10 mg were held in the context of acute GI bleed. Home blood pressures resumed post EGD.     GERD Chronic and stable. Continued Protonix 40 mg PO BID.    Hyperglycemia Glucose on admission 148, improved to 113. Hemoglobin A1c on 9/20 5.7.  Recommended outpatient follow up with PCP.    Heart Murmur Presence of 2/6 systolic murmur in aortic region on physical exam. ECHO conducted on 9/20 showed LVEF 60-65%, mitral valve calcification, aortic valve regurgitation, and mild concentric left ventricular hypertrophy. Left ventricular diastolic parameters are consistent with Grade I diastolic dysfunction (impaired relaxation). Patient will need to have aortic stenosis monitored over time.   Patient's PCP is Dr. Leilani Able at [facility] in [place].

## 2020-11-29 NOTE — Evaluation (Signed)
Occupational Therapy Evaluation Patient Details Name: Kayla Moody MRN: [id number] DOB: December 23, 1965 Today's Date: 11/29/2020   History of Present Illness 55 y.o. female presenting to ED with bloody/loose stools x6, dizziness and mild low back pain. Hemoccult positive. Patient admitted with unspecified GI hemorrhage and symptomatic acute blood loss anemia. Plan for EGD 9/20. PMHx significant for HTN and OA.   Clinical Impression   PTA patient was living alone in a private residence and was grossly I with ADLs/IADLs. Patient currently functioning at baseline demonstrating observed ADLs including toileting and grooming standing at sink level with I. Patient able to ascend/descend 1 full flight of steps +rail (further mobility limited 2/2 elevated HR of 140bpm, RN aware). Patient does not require continued acute occupational therapy services with OT to sign off at this time. Patient safe to d/c home. Has daughter that can assist with IADLs as needed.      Recommendations for follow up therapy are one component of a multi-disciplinary discharge planning process, led by the attending physician.  Recommendations may be updated based on patient status, additional functional criteria and insurance authorization.   Follow Up Recommendations  No OT follow up    Equipment Recommendations  None recommended by OT    Recommendations for Other Services       Precautions / Restrictions Precautions Precautions: Fall Precaution Comments: Low fall risk Restrictions Weight Bearing Restrictions: No      Mobility Bed Mobility               General bed mobility comments: Patient up to commode in bathroom upon entry.    Transfers Overall transfer level: Independent Equipment used:  (Able to manage IV pole independently)                  Balance Overall balance assessment: Independent                                         ADL either performed or assessed with  clinical judgement   ADL Overall ADL's : Independent                                             Vision Patient Visual Report: No change from baseline Vision Assessment?: No apparent visual deficits     Perception     Praxis      Pertinent Vitals/Pain Pain Assessment: Faces Faces Pain Scale: Hurts little more Pain Location: Bilateral knees (chronic 2/2 OA) Pain Descriptors / Indicators: Aching;Sore Pain Intervention(s): Limited activity within patient's tolerance;Monitored during session     Hand Dominance Right   Extremity/Trunk Assessment Upper Extremity Assessment Upper Extremity Assessment: Overall WFL for tasks assessed   Lower Extremity Assessment Lower Extremity Assessment: Overall WFL for tasks assessed   Cervical / Trunk Assessment Cervical / Trunk Assessment: Normal   Communication Communication Communication: No difficulties   Cognition Arousal/Alertness: Awake/alert Behavior During Therapy: WFL for tasks assessed/performed Overall Cognitive Status: Within Functional Limits for tasks assessed                                     General Comments  HR 99bpm at rest. HR 140 max with mobility and stairs. RN aware.  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        OT Problem List:        OT Treatment/Interventions:      OT Goals(Current goals can be found in  the care plan section) Acute Rehab OT Goals Patient Stated Goal: To return home. OT Goal Formulation: With patient  OT Frequency:     Barriers to D/C:            Co-evaluation              AM-PAC OT "6 Clicks" Daily Activity     Outcome Measure Help from another person eating meals?: None Help from another person taking care of personal grooming?: None Help from another person toileting, which includes using toliet, bedpan, or urinal?: None Help from another person bathing (including washing, rinsing, drying)?: None Help from another person to put on and taking off regular upper body clothing?: None Help from another person to put on and taking off regular lower body clothing?: None 6 Click Score: 24   End of Session Nurse Communication: Mobility status;Other (comment) (HR 140 max with stair negotiation)  Activity Tolerance: Patient tolerated treatment well Patient left: in chair;with call bell/phone within reach  OT Visit Diagnosis: Muscle weakness (generalized) (M62.81)                Time: 0174-9449 OT Time Calculation (min): 12 min Charges:  OT General Charges $OT Visit: 1 Visit OT Evaluation $OT Eval Low Complexity: 1 Low  Emmory Solivan H. OTR/L Supplemental OT, Department of rehab services [phone]  Alessia Gonsalez R H. 11/29/2020, 8:26 AM

## 2020-11-29 NOTE — Transfer of Care (Signed)
Immediate Anesthesia Transfer of Care Note  Patient: Kayla Moody  Procedure(s) Performed: ESOPHAGOGASTRODUODENOSCOPY (EGD) WITH PROPOFOL BIOPSY  Patient Location: PACU and Endoscopy Unit  Anesthesia Type:MAC  Level of Consciousness: drowsy, patient cooperative and responds to stimulation  Airway & Oxygen Therapy: Patient Spontanous Breathing  Post-op Assessment: Report given to RN and Post -op Vital signs reviewed and stable  Post vital signs: Reviewed and stable  Last Vitals:  Vitals Value Taken Time  BP    Temp    Pulse    Resp    SpO2      Last Pain:  Vitals:   11/29/20 0915  TempSrc: Oral  PainSc: 0-No pain      Patients Stated Pain Goal: 2 (83/38/25 0539)  Complications: No notable events documented.

## 2020-11-29 NOTE — Anesthesia Preprocedure Evaluation (Signed)
Anesthesia Evaluation  Patient identified by MRN, date of birth, ID band Patient awake    Reviewed: Allergy & Precautions, H&P , NPO status , Patient's Chart, lab work & pertinent test results, reviewed documented beta blocker date and time   Airway Mallampati: II  TM Distance: >3 FB Neck ROM: full    Dental no notable dental hx.    Pulmonary neg pulmonary ROS,    Pulmonary exam normal breath sounds clear to auscultation       Cardiovascular Exercise Tolerance: Good hypertension, + Valvular Problems/Murmurs AS  Rhythm:regular Rate:Normal     Neuro/Psych  Headaches, negative psych ROS   GI/Hepatic Neg liver ROS, GERD  Medicated,  Endo/Other  negative endocrine ROS  Renal/GU negative Renal ROS  negative genitourinary   Musculoskeletal  (+) Arthritis , Osteoarthritis,    Abdominal   Peds  Hematology  (+) Blood dyscrasia, anemia ,   Anesthesia Other Findings   Reproductive/Obstetrics negative OB ROS                             Anesthesia Physical Anesthesia Plan  ASA: 3  Anesthesia Plan: MAC   Post-op Pain Management:    Induction:   PONV Risk Score and Plan: 3  Airway Management Planned: Nasal Cannula, Natural Airway and Simple Face Mask  Additional Equipment: None  Intra-op Plan:   Post-operative Plan:   Informed Consent: I have reviewed the patients History and Physical, chart, labs and discussed the procedure including the risks, benefits and alternatives for the proposed anesthesia with the patient or authorized representative who has indicated his/her understanding and acceptance.     Dental Advisory Given  Plan Discussed with: CRNA and Anesthesiologist  Anesthesia Plan Comments:         Anesthesia Quick Evaluation

## 2020-11-29 NOTE — Progress Notes (Signed)
FPTS Brief Progress Note  S: Resting in bed watching TV. Recently went to the restroom and denies seeing blood in stool. Feeling lightheaded.    O: BP (!) 118/48 (BP Location: Left Arm)   Pulse 87   Temp 98.6 F (37 C) (Oral)   Resp 16   Ht 5\' 5"  (1.651 m)   Wt 108.9 kg   SpO2 100%   BMI 39.95 kg/m   General: No acute distress Respiratory: normal effort   A/P: 1. Gastrointestinal hemorrhage, unspecified gastrointestinal hemorrhage type  2. Acute blood loss anemia  - Orders reviewed. Labs for AM ordered, which was adjusted as needed.  - CBC now, consider need for transfusion if hgb below 7 - Manual BP check  Autry-Lott, , DO 11/29/2020, 1:11 AM PGY-3, [facility] Night Resident  Please page [phone] with questions.

## 2020-11-29 NOTE — Progress Notes (Signed)
  Echocardiogram 2D Echocardiogram has been performed.  Gerda Diss 11/29/2020, 5:31 PM

## 2020-11-29 NOTE — Progress Notes (Signed)
PT Cancellation Note  Patient Details Name: Marializ Ferrebee MRN: [id number] DOB: 1966-01-21   Cancelled Treatment:    Reason Eval/Treat Not Completed: PT screened, no needs identified, will sign off (Pt independent on OT evaluation with ADL's, mobility and stair negotiation).  Lillia Pauls, PT, DPT Acute Rehabilitation Services Pager [phone] Office [phone]    Norval Morton 11/29/2020, 9:36 AM

## 2020-11-29 NOTE — Progress Notes (Addendum)
Family Medicine Teaching Service Daily Progress Note Intern Pager: [phone]  Patient name: Kayla Moody Medical record number: [id number] Date of birth: 03/28/1965 Age: 55 y.o. Gender: female  Primary Care Provider: Patient, No Pcp Per (Inactive) Consultants: GI Code Status: Full  Pt Overview and Major Events to Date:  9/19: Admitted to Family Medicine  9/20: Hemoglobin of 7, transfused 1 unit pRBC  Assessment and Plan: Kayla Moody is a 55 y.o. female with past medical history significant for HTN, GERD, OA, and heart murmur presenting with occasional dizziness starting on 9/16 and six episodes of painless, loose hematochezia starting the evening of 9/18.   GI Bleed  Symptomatic anemia: Patient presented with symptomatic anemia, likely secondary to GI bleed. Admitted with Hgb of 9 with transfusion threshold of 7. Hgb down to 7 this morning. Ordered 1 unit pRBC. Follow up repeat Hgb appropriate at 8.7. Most recent labs show Iron (88), TIBC (340), and Ferritin (64) within normal limits. OT evaluated patient and had no further recommendations. Awaiting PT recommendations. EGD morning of 9/20 showed duodenal erosions with stigmata of recent bleeding and mucosal changes which may be gastritis. Biopsy positive for H. Pylori. Patient agreed to treatment. Patient reports dizziness with positional changes. Last bowel movement night of 9/19 with "two drops of blood." Will continue to monitor overnight with potential discharge tomorrow 9/21 pending improvement in dizziness.  - F/u pathology reports from EGD biopsies on 9/20  - Resume regular diet and d/c LR infusion as tolerated  - Recommend d/c NSAID use  - Patient agreed to outpatient colonoscopy  - F/u am CBC for 9/21  - H. pylori antigen stool positive, initiated  triple therapy Clarithromycin 500 mg PO BID x 14 days and Amoxicillin 1000 mg PO BID x 14 days and Protonix 40 mg BID PO daily  - MiraLAX 17 g packet PO daily - D/c continuous  cardiac telemetry - Continue SCDs for DVT prophylaxis - D/c enteric precautions  - F/u PT recommendations   HTN: Last took blood pressure medications 9/18 morning, home meds include lisinopril-HCTZ combo and amlodipine. Systolic blood pressures ranged from 125-162 and diastolic blood pressures ranged from 60-79 on 9/20.  - Restart home antihypertensives lisinopril-HCTZ combo 40 mg and amlodipine 10 mg.    GERD: Chronic and stable  - Continue Protonix 40 mg PO BID    Hyperglycemia: Glucose on admission 148, improved to 113 this morning. Hemoglobin A1c on 9/20 5.7.  - CBGs as appropriate  - Diet and exercise counseling outpatient - Outpatient f/u with PCP   Heart Murmur: Presence of 2/6 systolic murmur in aortic region. - F/u Echo   Hepatic steatosis: Chronic and stable   OA: Chronic and stable   FEN/GI: Resume regular diet.  PPx: SCDs Dispo:Home tomorrow. Barriers include clinical improvement.   Subjective:  Patient resting in bed, watching TV. She mentions her son just left and the visit went well. She continues to have positional dizziness. She was able to walk in her room to the bathroom, brush her teeth, and wash her face. She asks about returning to work. Tried to eat lunch and was able to eat a few bites of a hamburger and lettuce. Felt nauseous after eating but no vomiting. Last bowel movement overnight with "two drops of blood." No abdominal pain this afternoon. No further questions at this time.   Objective: Temp:  [98 F (36.7 C)-99.5 F (37.5 C)] 99.1 F (37.3 C) (09/20 0603) Pulse Rate:  [83-126] 86 (09/20 0603) Resp:  [  13-19] 18 (09/20 0603) BP: (101-151)/(48-104) 142/70 (09/20 0603) SpO2:  [98 %-100 %] 98 % (09/20 0603) Weight:  [99.8 kg-108.9 kg] 108.9 kg (09/19 2013) Physical Exam: Gen: age-appropriate, AAW, resting comfortably in bed, NAD, class II obesity HEENT: NCAT, anicteric sclerae CV: 2/6 systolic murmur in aortic region, RRR, 2+ radial  pulses Lung: CTAB, normal WOB Abd:  NT, soft, non-distended, +BS Neuro: No focal deficits, AOx3 Skin: warm, dry, no rashes noted  Ext: 2+ DP, no peripheral edema  Laboratory: Recent Labs  Lab 11/28/20 1021 11/28/20 2059 11/29/20 0053  WBC 10.2 10.0 10.3  HGB 9.0* 7.7* 7.0*  HCT 27.5* 22.8* 21.2*  PLT 323 272 266   Recent Labs  Lab 11/28/20 1021 11/29/20 0053  NA 140 139  K 3.6 3.7  CL 105 108  CO2 25 25  BUN 25* 12  CREATININE 0.69 0.54  CALCIUM 8.9 8.2*  PROT 6.2* 5.5*  BILITOT 0.5 0.4  ALKPHOS 50 40  ALT 21 17  AST 16 15  GLUCOSE 148* 113*    Imaging/Diagnostic Tests: Reviewed images and diagnostic tests over the last 24 hours.   Dorise Hiss [facility] SOM, MS-IV  11/29/2020, 7:02 AM FPTS Intern pager: [phone], text pages welcome   FPTS Upper-Level Resident Addendum   I have independently interviewed and examined the patient. I have discussed the above with the original author and agree with their documentation. I have made additional edits as necessary. Please see also any attending notes.   Shirlean Mylar, M.D. PGY-2, [facility] 11/29/2020 5:16 PM  FPTS Service pager: [phone] (text pages welcome through [facility])

## 2020-11-29 NOTE — Anesthesia Procedure Notes (Signed)
Procedure Name: MAC Date/Time: 11/29/2020 9:44 AM Performed by: Janace Litten, CRNA Pre-anesthesia Checklist: Emergency Drugs available, Patient identified, Suction available, Patient being monitored and Timeout performed Patient Re-evaluated:Patient Re-evaluated prior to induction Oxygen Delivery Method: Nasal cannula

## 2020-11-30 ENCOUNTER — Other Ambulatory Visit (HOSPITAL_COMMUNITY): Payer: Self-pay

## 2020-11-30 ENCOUNTER — Encounter (HOSPITAL_COMMUNITY): Payer: Self-pay | Admitting: Family Medicine

## 2020-11-30 DIAGNOSIS — K2970 Gastritis, unspecified, without bleeding: Secondary | ICD-10-CM

## 2020-11-30 DIAGNOSIS — B9681 Helicobacter pylori [H. pylori] as the cause of diseases classified elsewhere: Secondary | ICD-10-CM

## 2020-11-30 HISTORY — DX: Gastritis, unspecified, without bleeding: K29.70

## 2020-11-30 HISTORY — DX: Helicobacter pylori (H. pylori) as the cause of diseases classified elsewhere: B96.81

## 2020-11-30 LAB — TYPE AND SCREEN
ABO/RH(D): A POS
Antibody Screen: NEGATIVE
Unit division: 0
Unit division: 0

## 2020-11-30 LAB — CBC
HCT: 25.4 % — ABNORMAL LOW (ref 36.0–46.0)
Hemoglobin: 8.8 g/dL — ABNORMAL LOW (ref 12.0–15.0)
MCH: 31.1 pg (ref 26.0–34.0)
MCHC: 34.6 g/dL (ref 30.0–36.0)
MCV: 89.8 fL (ref 80.0–100.0)
Platelets: 275 10*3/uL (ref 150–400)
RBC: 2.83 MIL/uL — ABNORMAL LOW (ref 3.87–5.11)
RDW: 12.5 % (ref 11.5–15.5)
WBC: 8.9 10*3/uL (ref 4.0–10.5)
nRBC: 0.6 % — ABNORMAL HIGH (ref 0.0–0.2)

## 2020-11-30 LAB — BPAM RBC
Blood Product Expiration Date: 202210112359
Blood Product Expiration Date: 202210112359
ISSUE DATE / TIME: 202209200230
ISSUE DATE / TIME: 202209200319
Unit Type and Rh: 6200
Unit Type and Rh: 6200

## 2020-11-30 MED ORDER — POLYETHYLENE GLYCOL 3350 17 GM/SCOOP PO POWD
17.0000 g | Freq: Every day | ORAL | 0 refills | Status: DC
  Filled 2020-11-30: qty 510, 30d supply, fill #0

## 2020-11-30 MED ORDER — BISMUTH SUBSALICYLATE 262 MG PO CHEW
2.0000 | CHEWABLE_TABLET | Freq: Four times a day (QID) | ORAL | 0 refills | Status: AC
  Filled 2020-11-30: qty 112, 14d supply, fill #0

## 2020-11-30 MED ORDER — METRONIDAZOLE 250 MG PO TABS
250.0000 mg | ORAL_TABLET | Freq: Four times a day (QID) | ORAL | 0 refills | Status: AC
  Filled 2020-11-30: qty 56, 14d supply, fill #0

## 2020-11-30 MED ORDER — AMOXICILLIN 500 MG PO CAPS
1000.0000 mg | ORAL_CAPSULE | Freq: Two times a day (BID) | ORAL | Status: DC
  Administered 2020-11-30: 1000 mg via ORAL
  Filled 2020-11-30: qty 2

## 2020-11-30 MED ORDER — OMEPRAZOLE 20 MG PO CPDR
20.0000 mg | DELAYED_RELEASE_CAPSULE | Freq: Two times a day (BID) | ORAL | 0 refills | Status: AC
  Filled 2020-11-30 (×2): qty 28, 14d supply, fill #0

## 2020-11-30 MED ORDER — DOXYCYCLINE HYCLATE 100 MG PO TABS: 100.0000 mg | ORAL_TABLET | Freq: Two times a day (BID) | ORAL | Status: DC

## 2020-11-30 MED ORDER — CLARITHROMYCIN 500 MG PO TABS
500.0000 mg | ORAL_TABLET | Freq: Two times a day (BID) | ORAL | Status: DC
  Administered 2020-11-30: 500 mg via ORAL
  Filled 2020-11-30: qty 1

## 2020-11-30 MED ORDER — DOXYCYCLINE HYCLATE 100 MG PO TABS
100.0000 mg | ORAL_TABLET | Freq: Two times a day (BID) | ORAL | 0 refills | Status: DC
  Filled 2020-11-30: qty 28, 14d supply, fill #0

## 2020-11-30 NOTE — Discharge Instructions (Addendum)
You were hospitalized at [facility] due to blood in your stools.  We expect this is from a GI bleed which improved after a transfusion and EGD.  We are so glad you are feeling better.  Be sure to follow-up with your regularly scheduled appointments.  Please also be sure to follow-up with your PCP at your earliest convenience within 1-2 weeks for a hospital follow up. Please also follow up with GI outpatient. Please take all medications as prescribed, as we discussed you will take the medications below. Thank you for allowing [place] to be a part of your medical care.  Please take flagyl 4 times daily for 14 days Please take doxycycline twice daily for 14 days Please take PeptoBismol 4 times daily for 14 days. Please take omeprazole twice daily for 14 days and then decrease to once a day until you see the gastroenterologist Please follow up with your PCP in 10 days to have a recheck of your blood count Do not take any Goody powders, diclofenac tablets and NSAIDs including ibuprofen.   Take care, [facility] family medicine team

## 2020-11-30 NOTE — Discharge Summary (Signed)
Family Medicine Teaching [facility] Discharge Summary  Patient name: Kayla Moody Medical record number: [id number] Date of birth: 12-Aug-1965 Age: 55 y.o. Gender: female Date of Admission: 11/28/2020  Date of Discharge: 11/30/20 Admitting Physician: Reece Leader, DO  Primary Care Provider: Patient, No Pcp Per (Inactive) Consultants: GI  Indication for Hospitalization:  GI Bleed with melena   Discharge Diagnoses/Problem List:  H pylori gastritis  Duodenitis  Aortic Stenosis  HTN GERD Hyperglycemia   Disposition: Discharge to home   Discharge Condition: Stable  Discharge Exam:  Gen: age-appropriate, AAW, resting comfortably in bed, NAD, class II obesity HEENT: NCAT, anicteric sclerae CV: unchanged compared to previous exam 2/6 systolic murmur in aortic region, RRR, 2+ radial pulses Lung: CTAB, normal WOB Abd:  NT, soft, non-distended, +BS Neuro: No focal deficits, AOx4 Skin: warm, dry, no rashes noted  Ext: 2+ DP, no peripheral edema  Brief Hospital Course:  Kayla Moody is a 55 y.o. female with past medical history significant for HTN, GERD, OA, and heart murmur presenting with occasional dizziness starting on 9/16 and six episodes of painless, loose hematochezia starting the evening of 9/18.   GI Bleed  Symptomatic anemia Patient presented with symptomatic anemia, likely secondary to GI bleed with EGD showcasing duodenal erosions with stigmata of recent bleeding and mucosal changes which may be gastritis. Endoscopy biopsy positive for H. Pylori, will initiate quadruple therapy omeprazole 20 mg BID x 14 days, Pepto Bismol 2 tab (262 mg each) four times a day x 14 days, Metronidazole 250 mg 4 times a day x 14 days, and Doxycycline 100 mg BID x 14 days. During the hospital course the patient had a hemoglobin of 7, transfused 1 unit pRBC. Hemoglobin trending upwards with 8.8 on discharge. PT/OT evaluated patient during hospitalization and had no further recommendations.     HTN On admission, patient presented with multiple soft blood pressure. Home meds including Lisinopril-HCTZ combo 40 mg and Amlodipine 10 mg which were held in the context of acute GI bleed. Home antihypertensives were resumed prior to discharge with no changes to regimen. Patient instructed to follow up with PCP routinely to ensure appropriate changes are made as needed.    Heart Murmur Presence of 2/6 systolic murmur in aortic region on physical exam. ECHO conducted on 9/20 showed LVEF 60-65%, mitral valve calcification, aortic valve regurgitation, and mild concentric left ventricular hypertrophy. Left ventricular diastolic parameters are consistent with Grade I diastolic dysfunction (impaired relaxation). Patient will need to have aortic stenosis monitored over time with routine echo imaging.    Issues for Follow Up:  Follow up with GI outpatient 4-6 weeks for EGD biopsy results and to consider colonoscopy screening Ensure patient is not taking any NSAIDs or Goody powders Repeat CBC as Hgb on discharge was 8.8. Repeat CBC 7-10 days post discharge  ECHO showcased aortic stenosis and will need monitoring with future echo Patient started on quadruple therapy for H pylori treatment, please ensure she completes course. Consider repeat H pylori testing to ensure complete eradication.   Significant Procedures: Upper endoscopy on 9/20   Significant Labs and Imaging:  Recent Labs  Lab 11/28/20 2059 11/29/20 0053 11/29/20 1108 11/29/20 1837 11/29/20 2305 11/30/20 0657  WBC 10.0 10.3  --   --   --  8.9  HGB 7.7* 7.0*   < > 8.9* 9.2* 8.8*  HCT 22.8* 21.2*  --   --   --  25.4*  PLT 272 266  --   --   --  275   < > = values in this interval not displayed.   Recent Labs  Lab 11/28/20 1021 11/29/20 0053  NA 140 139  K 3.6 3.7  CL 105 108  CO2 25 25  GLUCOSE 148* 113*  BUN 25* 12  CREATININE 0.69 0.54  CALCIUM 8.9 8.2*  ALKPHOS 50 40  AST 16 15  ALT 21 17  ALBUMIN 3.5 3.0*    Results/Tests Pending at Time of Discharge:  Biopsy pathology from duodenum and stomach  Discharge Medications:  Allergies as of 11/30/2020       Reactions   Penicillins Anaphylaxis, Other (See Comments)   Family history of severe allergic reactions to this, so the patient does not attempt it. Tolerated amoxicillin 11/30/2020, no issues noted.         Medication List     STOP taking these medications    acetaminophen 650 MG CR tablet Commonly known as: TYLENOL   acetaminophen-codeine 300-30 MG tablet Commonly known as: TYLENOL #3   diclofenac 75 MG EC tablet Commonly known as: VOLTAREN   diclofenac Sodium 1 % Gel Commonly known as: Voltaren   HEARTBURN RELIEF 24 HOUR PO   hydrochlorothiazide 25 MG tablet Commonly known as: HYDRODIURIL   lisinopril 40 MG tablet Commonly known as: ZESTRIL   meloxicam 7.5 MG tablet Commonly known as: Mobic   methocarbamol 500 MG tablet Commonly known as: ROBAXIN   traMADol 50 MG tablet Commonly known as: ULTRAM       TAKE these medications    amLODipine 10 MG tablet Commonly known as: NORVASC TAKE 1 TABLET BY MOUTH DAILY   bismuth subsalicylate 262 MG chewable tablet Commonly known as: PEPTO BISMOL Chew 2 tablets (524 mg total) by mouth in the morning, at noon, in the evening, and at bedtime for 14 days.   cloNIDine 0.2 MG tablet Commonly known as: CATAPRES Take 0.2 mg by mouth daily as needed (as directed for elevated B/P).   doxycycline 100 MG tablet Commonly known as: VIBRA-TABS Take 1 tablet (100 mg total) by mouth 2 (two) times daily.   metroNIDAZOLE 250 MG tablet Commonly known as: Flagyl Take 1 tablet (250 mg total) by mouth 4 (four) times daily for 14 days.   olmesartan-hydrochlorothiazide 40-12.5 MG tablet Commonly known as: BENICAR HCT Take 1 tablet by mouth daily.   omeprazole 20 MG tablet Commonly known as: PriLOSEC OTC Take 1 tablet (20 mg total) by mouth in the morning and at bedtime for 14  days.   polyethylene glycol powder 17 GM/SCOOP powder Commonly known as: GLYCOLAX/MIRALAX Take 17 g by mouth daily.        Discharge Instructions: Please refer to Patient Instructions section of EMR for full details.  Patient was counseled important signs and symptoms that should prompt return to medical care, changes in medications, dietary instructions, activity restrictions, and follow up appointments. Note to return to work for 9/23.   Follow-Up Appointments:  Follow-up Information      Gastroenterology. Schedule an appointment as soon as possible for a visit.   Specialty: Gastroenterology Why: Please make an appointment at your earliest convenience for GI follow up. Contact information: [address] [place] [address] [phone]        Leilani Able, MD. Schedule an appointment as soon as possible for a visit.   Specialty: Family Medicine Why: Please make an appointment to be seen in 10 days for blood work and hospital follow up. Contact information: [address] [facility] [facility]  [place] [address] [phone]                 Dorise Hiss, Medical Student [facility]   I was personally present and performed or re-performed the history, physical exam and medical decision making activities of this service and have verified that the service and findings are accurately documented in the student's note. My edits are noted within the note, please also see any notes and attestation of the attending.   Reece Leader, DO                  11/30/2020, 4:28 PM  PGY-2, [facility]

## 2020-11-30 NOTE — Progress Notes (Addendum)
Daily Rounding Note  11/30/2020, 11:04 AM  LOS: 2 days   SUBJECTIVE:   Chief complaint:   Feels tired, did not sleep well last night.  Eating solid food without problems.  No dizziness.  No weakness.  Feels well.  Last bowel movement was still very dark a couple of days ago.  OBJECTIVE:         Vital signs in last 24 hours:    Temp:  [98.1 F (36.7 C)-99.4 F (37.4 C)] 98.1 F (36.7 C) (09/21 0841) Pulse Rate:  [78-103] 78 (09/21 0841) Resp:  [16-18] 16 (09/21 0841) BP: (110-152)/(62-76) 152/67 (09/21 0841) SpO2:  [96 %-100 %] 100 % (09/21 0841) Last BM Date: 11/28/20 Filed Weights   11/28/20 1033 11/28/20 2013 11/29/20 0915  Weight: 99.8 kg 108.9 kg 108.9 kg   General: Comfortable, does not look ill. Heart: RRR. Chest: Clear bilaterally without labored breathing Abdomen: Not tender, not distended.  Soft.  Active bowel sounds. Extremities: No CCE. Neuro/Psych: Pleasant, calm, cooperative.  No confusion.  Fluid speech.  No gross deficits.  No tremors.  Intake/Output from previous day: 09/20 0701 - 09/21 0700 In: 1509.7 [P.O.:600; I.V.:909.7] Out: 0   Intake/Output this shift: Total I/O In: 240 [P.O.:240] Out: -   Lab Results: Recent Labs    11/28/20 2059 11/29/20 0053 11/29/20 1108 11/29/20 1837 11/29/20 2305 11/30/20 0657  WBC 10.0 10.3  --   --   --  8.9  HGB 7.7* 7.0*   < > 8.9* 9.2* 8.8*  HCT 22.8* 21.2*  --   --   --  25.4*  PLT 272 266  --   --   --  275   < > = values in this interval not displayed.   BMET Recent Labs    11/28/20 1021 11/29/20 0053  NA 140 139  K 3.6 3.7  CL 105 108  CO2 25 25  GLUCOSE 148* 113*  BUN 25* 12  CREATININE 0.69 0.54  CALCIUM 8.9 8.2*   LFT Recent Labs    11/28/20 1021 11/29/20 0053  PROT 6.2* 5.5*  ALBUMIN 3.5 3.0*  AST 16 15  ALT 21 17  ALKPHOS 50 40  BILITOT 0.5 0.4   PT/INR Recent Labs    11/28/20 1118  LABPROT 14.8  INR 1.2      ASSESMENT:     GIB, melena.  11/29/20 EGD: Duodenal erosions, gastritis, nonobstructing distal esophageal ring.  Awaiting biopsy pathology from duodenum and stomach.  CLOtest positive.  On bid PPI, amoxicillin, Biaxin     Normocytic anemia.  Hgb 9 >> 7 >> 1 PRBC  >> 8.2.  No evidence of iron deficiency on anemia panel.  Hepatic steatosis per CT  with normal LFTs   PLAN     Complete 14 d of abx, bid PPI.  Will clarify duration of bid PPI , but eventually drop dose to 1 x daily.     Await path report.     Jennye Moccasin  11/30/2020, 11:04 AM Phone [phone]      [facility] Attending   I have taken an interval history, reviewed the chart and examined the patient. I agree with the Advanced Practitioner's note, impression and recommendations.  Majority the medical decision-making in the formulation of the assessment and plan were performed by me.   H pylori gastritis and duodenitis is dx She is improved   I have dced the abx Rxed as bismuth-based  quadruple tx is preferred (see what I pasted from UTD below)  Here is my recommendation for H pylori tx - any PPi is fine but this is often cheapest  1) Omeprazole 20 mg 2 times a day x 14 d 2) Pepto Bismol 2 tabs (262 mg each) 4 times a day x 14 d 3) Metronidazole 250 mg 4 times a day x 14 d 4) doxycycline 100 mg 2 times a day x 14 d (or tetracycline 500 mg qid)  After 14 d stop omeprazole also  In 4 weeks after treatment completed do H. Pylori stool antigen - dx H. Pylori gastritis  I have my staff working on GI f/u in 4-6 weeks and we can discuss a screening colonoscopy then. I will f/u path and contact her as well and arrange additional labs  OK to dc today  I think daily OTC iron supplement sensible also  Iva Boop, MD, [facility] 11/30/2020 11:56 AM  Bismuth quadruple therapy consists of bismuth, metronidazole, tetracycline, and a PPI. Clarithromycin based triple therapy with amoxicillin  consists of clarithromycin, amoxicillin, and a PPI. Clarithromycin based triple therapy with metronidazole consists of clarithromycin, metronidazole, and a PPI. * In the [place], given the limited information on antimicrobial resistance rates, we generally assume clarithromycin resistance rates are =15% unless local data indicate otherwise.  Alternative first-line antibiotic regimens include bismuth quadruple therapy and clarithromycin based concomitant therapy. Other potential treatment regimens include clarithromycin based sequential or hybrid therapy. However, hybrid therapy has not been universally endorsed as an option for first-line therapy and some [place] guidelines do not support the use of sequential therapy. Refer to UpToDate topic on treatment regimens for H. pylori for additional details.

## 2020-12-01 LAB — SURGICAL PATHOLOGY

## 2020-12-02 ENCOUNTER — Encounter: Payer: Self-pay | Admitting: Internal Medicine

## 2020-12-02 DIAGNOSIS — B9681 Helicobacter pylori [H. pylori] as the cause of diseases classified elsewhere: Secondary | ICD-10-CM

## 2020-12-02 HISTORY — DX: Helicobacter pylori (H. pylori) as the cause of diseases classified elsewhere: B96.81

## 2021-01-03 ENCOUNTER — Ambulatory Visit (INDEPENDENT_AMBULATORY_CARE_PROVIDER_SITE_OTHER): Payer: 59 | Admitting: Physician Assistant

## 2021-01-03 ENCOUNTER — Other Ambulatory Visit (INDEPENDENT_AMBULATORY_CARE_PROVIDER_SITE_OTHER): Payer: 59

## 2021-01-03 ENCOUNTER — Encounter: Payer: Self-pay | Admitting: Physician Assistant

## 2021-01-03 VITALS — BP 150/90 | HR 82 | Ht 65.0 in | Wt 235.0 lb

## 2021-01-03 DIAGNOSIS — Z8719 Personal history of other diseases of the digestive system: Secondary | ICD-10-CM

## 2021-01-03 DIAGNOSIS — K2980 Duodenitis without bleeding: Secondary | ICD-10-CM

## 2021-01-03 DIAGNOSIS — D649 Anemia, unspecified: Secondary | ICD-10-CM

## 2021-01-03 DIAGNOSIS — Z1211 Encounter for screening for malignant neoplasm of colon: Secondary | ICD-10-CM | POA: Diagnosis not present

## 2021-01-03 DIAGNOSIS — B9681 Helicobacter pylori [H. pylori] as the cause of diseases classified elsewhere: Secondary | ICD-10-CM

## 2021-01-03 DIAGNOSIS — K2970 Gastritis, unspecified, without bleeding: Secondary | ICD-10-CM

## 2021-01-03 LAB — CBC WITH DIFFERENTIAL/PLATELET
Basophils Absolute: 0 10*3/uL (ref 0.0–0.1)
Basophils Relative: 0.6 % (ref 0.0–3.0)
Eosinophils Absolute: 0.1 10*3/uL (ref 0.0–0.7)
Eosinophils Relative: 2.3 % (ref 0.0–5.0)
HCT: 37.8 % (ref 36.0–46.0)
Hemoglobin: 12.6 g/dL (ref 12.0–15.0)
Lymphocytes Relative: 37.4 % (ref 12.0–46.0)
Lymphs Abs: 2.4 10*3/uL (ref 0.7–4.0)
MCHC: 33.2 g/dL (ref 30.0–36.0)
MCV: 89.2 fl (ref 78.0–100.0)
Monocytes Absolute: 0.6 10*3/uL (ref 0.1–1.0)
Monocytes Relative: 9.5 % (ref 3.0–12.0)
Neutro Abs: 3.2 10*3/uL (ref 1.4–7.7)
Neutrophils Relative %: 50.2 % (ref 43.0–77.0)
Platelets: 456 10*3/uL — ABNORMAL HIGH (ref 150.0–400.0)
RBC: 4.24 Mil/uL (ref 3.87–5.11)
RDW: 13.6 % (ref 11.5–15.5)
WBC: 6.3 10*3/uL (ref 4.0–10.5)

## 2021-01-03 MED ORDER — PANTOPRAZOLE SODIUM 40 MG PO TBEC: 40.0000 mg | DELAYED_RELEASE_TABLET | Freq: Every day | ORAL | 0 refills | Status: DC

## 2021-01-03 NOTE — Patient Instructions (Addendum)
If you are age 55 or younger, your body mass index should be between 19-25. Your Body mass index is 39.11 kg/m. If this is out of the aformentioned range listed, please consider follow up with your Primary Care Provider.  ________________________________________________________  The [facility] providers would like to encourage you to use [facility] to communicate with providers for non-urgent requests or questions.  Due to long hold times on the telephone, sending your provider a message by [facility] may be a faster and more efficient way to get a response.  Please allow 48 business hours for a response.  Please remember that this is for non-urgent requests.   You have been scheduled for a colonoscopy. Please follow written instructions given to you at your visit today.  Please pick up your prep supplies at the pharmacy within the next 1-3 days. If you use inhalers (even only as needed), please bring them with you on the day of your procedure.  A 1 month supply has been sent to your pharmacy.  Your provider has requested that you go to the basement level for lab work before leaving today. Press "B" on the elevator. The lab is located at the first door on the left as you exit the elevator. ** 2-3 weeks after you have completed your Pantoprazole, complete the H. Pylori stool test.  Avoid Aleve, Ibuprofen and other NSAIDs, use Tylenol for pain.  Follow up pending at this time.  Thank you for entrusting me with your care and choosing [facility].  Amy Esterwood, PA-C

## 2021-01-03 NOTE — Progress Notes (Signed)
Subjective:    Patient ID: Kayla Moody, female    DOB: Jun 03, 1965, 55 y.o.   MRN: [id number]  HPI Kayla Moody is a pleasant 55 year old female, who comes in today for post hospital follow-up.  She had been admitted 11/28/2020 with complaints of rectal bleeding/melena, and was noted to have hemoglobin of 9.  She underwent CT angio of the abdomen which was negative other than showing some hepatic steatosis.  She was evaluated by Dr. Leone Payor in consultation and underwent EGD on 11/29/2020 with finding of multiple diffuse erosions in the duodenal bulb and mild diffuse gastritis.  Biopsies showed peptic duodenitis with ulceration and CLO testing was positive for H. pylori.  She was discharged on a regimen of Pepto-Bismol/amoxicillin/Biaxin and twice daily Protonix. Patient says all the medications she was given were completed in about 2 weeks and she has not been on anything over the past couple of weeks. She did have a drift in her hemoglobin during admission down to 7 and was transfused 1 unit of packed RBCs with hemoglobin of 8.2 on discharge.  She was not iron deficient. She relates that she had been taking quite a bit of various over-the-counter medicines including Tylenol, extra strength Excedrin and Aleve, mostly for joint pains and headaches.  She has not been using any of these medications since discharge from the hospital. She says she has been feeling pretty well, energy level has gradually been improving, she is not having any difficulty eating and denies any problems with abdominal pain or nausea, no heartburn or indigestion currently but says she has been more gassy.  Stools for most part are back to normal she has not noted any further melena. Other medical problems include hypertension, osteoarthritis, obesity with BMI of 39 Family history is negative for colon cancer and polyps as far she is aware.  Review of Systems Pertinent positive and negative review of systems were noted in the above  HPI section.  All other review of systems was otherwise negative.  Outpatient Encounter Medications as of 01/03/2021  Medication Sig   amLODipine (NORVASC) 10 MG tablet TAKE 1 TABLET BY MOUTH DAILY (Patient taking differently: Take 10 mg by mouth daily.)   diclofenac Sodium (VOLTAREN) 1 % GEL Apply topically.   olmesartan-hydrochlorothiazide (BENICAR HCT) 40-12.5 MG tablet Take 1 tablet by mouth daily.   pantoprazole (PROTONIX) 40 MG tablet Take 1 tablet (40 mg total) by mouth daily.   [DISCONTINUED] cloNIDine (CATAPRES) 0.2 MG tablet Take 0.2 mg by mouth daily as needed (as directed for elevated B/P).   [DISCONTINUED] doxycycline (VIBRA-TABS) 100 MG tablet Take 1 tablet (100 mg total) by mouth 2 (two) times daily.   [DISCONTINUED] polyethylene glycol powder (GLYCOLAX/MIRALAX) 17 GM/SCOOP powder Take 17 g by mouth daily.   No facility-administered encounter medications on file as of 01/03/2021.   Allergies  Allergen Reactions   Penicillins Anaphylaxis and Other (See Comments)    Family history of severe allergic reactions to this, so the patient does not attempt it. Tolerated amoxicillin 11/30/2020, no issues noted.    Patient Active Problem List   Diagnosis Date Noted   H. pylori duodenitis 12/02/2020   Helicobacter pylori gastritis 11/30/2020   Melena    Gastrointestinal hemorrhage    Duodenitis with hemorrhage    Symptomatic anemia 11/28/2020   Acute blood loss anemia    Bilateral primary osteoarthritis of knee 03/06/2018   Primary localized osteoarthritis of knees, bilateral 04/23/2017   Chronic tension-type headache, intractable 10/22/2016   Essential hypertension 06/28/2016  Heart murmur 06/28/2016   Social History   Socioeconomic History   Marital status: Widowed    Spouse name: Not on file   Number of children: 2   Years of education: Not on file   Highest education level: Not on file  Occupational History   Not on file  Tobacco Use   Smoking status: Never    Smokeless tobacco: Never  Vaping Use   Vaping Use: Never used  Substance and Sexual Activity   Alcohol use: No    Alcohol/week: 0.0 standard drinks   Drug use: No   Sexual activity: Never  Other Topics Concern   Not on file  Social History Narrative   Not on file   Social Determinants of Health   Financial Resource Strain: Not on file  Food Insecurity: Not on file  Transportation Needs: Not on file  Physical Activity: Not on file  Stress: Not on file  Social Connections: Not on file  Intimate Partner Violence: Not on file    Kayla Moody family history is not on file.      Objective:    Vitals:   01/03/21 1001  BP: (!) 150/90  Pulse: 82    Physical Exam Well-developed well-nourished AA female  in no acute distress.  Height, Weight, 235 BMI 39.1  HEENT; nontraumatic normocephalic, EOMI, PE R LA, sclera anicteric. Oropharynx; not examined today Neck; supple, no JVD Cardiovascular; regular rate and rhythm with S1-S2, no murmur rub or gallop Pulmonary; Clear bilaterally Abdomen; soft, nontender, nondistended, no palpable mass or hepatosplenomegaly, bowel sounds are active Rectal; not done today Skin; benign exam, no jaundice rash or appreciable lesions Extremities; no clubbing cyanosis or edema skin warm and dry Neuro/Psych; alert and oriented x4, grossly nonfocal mood and affect appropriate        Assessment & Plan:   #73 55 year old female with recent admission with melena about 1 month ago and found to have diffuse duodenal bulb erosions and mild diffuse gastritis. Patient H. pylori positive and has completed antibiotic regimen She also had been taking NSAIDs and aspirin suspect this may have been the cause of the duodenopathy.  She is currently relatively asymptomatic the only completed 2 weeks of PPI total  #2 anemia secondary to acute blood loss, no iron deficiency #3 colon cancer screening-no prior colonoscopy, average risk #4 obesity/BMI 39 5.   Osteoarthritis 6.  Hypertension  Plan; will restart Protonix 40 mg p.o. every morning x1 month Patient aware she should stay off of aspirin and NSAIDs, Tylenol use is okay Repeat CBC today We will plan to check H. pylori stool antigen in 6 to 7 weeks  Patient will be scheduled for colonoscopy with Dr. Leone Payor.  Procedure was discussed in detail with the patient including indications risk and benefits and she is agreeable to proceed. As of today's visit patient is appropriate for colonoscopy in the ambulatory care setting.  Jina Olenick S Sohail Capraro PA-C 01/03/2021   Cc: No ref. provider found

## 2021-01-18 ENCOUNTER — Encounter: Payer: Self-pay | Admitting: Orthopaedic Surgery

## 2021-01-18 ENCOUNTER — Ambulatory Visit: Payer: Self-pay

## 2021-01-18 ENCOUNTER — Other Ambulatory Visit: Payer: Self-pay

## 2021-01-18 ENCOUNTER — Ambulatory Visit (INDEPENDENT_AMBULATORY_CARE_PROVIDER_SITE_OTHER): Payer: 59 | Admitting: Orthopaedic Surgery

## 2021-01-18 DIAGNOSIS — M1711 Unilateral primary osteoarthritis, right knee: Secondary | ICD-10-CM

## 2021-01-18 DIAGNOSIS — M1712 Unilateral primary osteoarthritis, left knee: Secondary | ICD-10-CM

## 2021-01-18 NOTE — Progress Notes (Signed)
Office Visit Note   Patient: Kayla Moody           Date of Birth: [birth year]           MRN: [id number] Visit Date: 01/18/2021              Requested by: No referring provider defined for this encounter. PCP: Patient, No Pcp Per (Inactive)   Assessment & Plan: Visit Diagnoses:  1. Primary osteoarthritis of right knee   2. Primary osteoarthritis of left knee     Plan: Impression is bilateral knee end-stage DJD.  At this point conservative management no longer provides relief.  In the last 4 years we have tried injections, home exercise program, weight loss, knee sleeves, oral NSAIDs which she is no longer able to take due to recent hospitalization for H. pylori ulcers, activity modifications.  These are no longer effective therefore her last remaining option is to undergo a total knee replacement which she has elected to move forward with in the near future.  She would like to start with the right knee first.  Denies history of DVT, nickel allergy.  Questions encouraged and answered.  Risk benefits rehab recovery of the surgery also reviewed in detail.  Follow-Up Instructions: No follow-ups on file.   Orders:  Orders Placed This Encounter  Procedures   XR KNEE 3 VIEW LEFT   XR KNEE 3 VIEW RIGHT   No orders of the defined types were placed in this encounter.     Procedures: No procedures performed   Clinical Data: No additional findings.   Subjective: Chief Complaint  Patient presents with   Right Knee - Follow-up   Left Knee - Follow-up    HPI  Kayla Moody is a 55 year old female who follows up today for chronic severe bilateral knee pain due to end-stage DJD.  I been treating her with conservative management for the last 4 years.  She has gotten to the point where she feels nothing provides relief anymore.  She has had cortisone injections with very temporary relief.  She use Voltaren gel on a daily basis.  Knee brace is only helped a little bit.  She is having a lot  of difficulty working and standing and has start up pain and stiffness every morning getting out of bed.  She feels that she has no quality of life and is severely limited.  Review of Systems  Constitutional: Negative.   HENT: Negative.    Eyes: Negative.   Respiratory: Negative.    Cardiovascular: Negative.   Endocrine: Negative.   Musculoskeletal: Negative.   Neurological: Negative.   Hematological: Negative.   Psychiatric/Behavioral: Negative.    All other systems reviewed and are negative.   Objective: Vital Signs: There were no vitals taken for this visit.  Physical Exam Vitals and nursing note reviewed.  Constitutional:      Appearance: She is well-developed.  Pulmonary:     Effort: Pulmonary effort is normal.  Skin:    General: Skin is warm.     Capillary Refill: Capillary refill takes less than 2 seconds.  Neurological:     Mental Status: She is alert and oriented to person, place, and time.  Psychiatric:        Behavior: Behavior normal.        Thought Content: Thought content normal.        Judgment: Judgment normal.    Ortho Exam  Bilateral knees show slight varus alignment.  Trace effusion.  Severe patellofemoral  crepitus throughout arc of motion.  Collaterals and cruciates are stable.  Range of motion is limited secondary to pain.  Specialty Comments:  No specialty comments available.  Imaging: No results found.   PMFS History: Patient Active Problem List   Diagnosis Date Noted   H. pylori duodenitis 12/02/2020   Helicobacter pylori gastritis 11/30/2020   Melena    Gastrointestinal hemorrhage    Duodenitis with hemorrhage    Symptomatic anemia 11/28/2020   Acute blood loss anemia    Bilateral primary osteoarthritis of knee 03/06/2018   Primary localized osteoarthritis of knees, bilateral 04/23/2017   Chronic tension-type headache, intractable 10/22/2016   Essential hypertension 06/28/2016   Heart murmur 06/28/2016   Past Medical History:   Diagnosis Date   Allergy    H. pylori duodenitis 12/02/2020   Helicobacter pylori gastritis 11/30/2020   Hypertension     Family History  Problem Relation Age of Onset   Colon polyps Neg Hx    Colon cancer Neg Hx    Stomach cancer Neg Hx    Esophageal cancer Neg Hx    Rectal cancer Neg Hx     Past Surgical History:  Procedure Laterality Date   BIOPSY  11/29/2020   Procedure: BIOPSY;  Surgeon: Iva Boop, MD;  Location: [facility] ENDOSCOPY;  Service: Endoscopy;;   CHOLECYSTECTOMY     ESOPHAGOGASTRODUODENOSCOPY (EGD) WITH PROPOFOL N/A 11/29/2020   Procedure: ESOPHAGOGASTRODUODENOSCOPY (EGD) WITH PROPOFOL;  Surgeon: Iva Boop, MD;  Location: [facility] ENDOSCOPY;  Service: Endoscopy;  Laterality: N/A;   HERNIA REPAIR     Social History   Occupational History   Not on file  Tobacco Use   Smoking status: Never   Smokeless tobacco: Never  Vaping Use   Vaping Use: Never used  Substance and Sexual Activity   Alcohol use: No    Alcohol/week: 0.0 standard drinks   Drug use: No   Sexual activity: Never

## 2021-01-27 ENCOUNTER — Encounter: Payer: Self-pay | Admitting: Internal Medicine

## 2021-01-27 ENCOUNTER — Ambulatory Visit (AMBULATORY_SURGERY_CENTER): Payer: 59 | Admitting: Internal Medicine

## 2021-01-27 VITALS — BP 139/88 | HR 63 | Temp 98.0°F | Resp 17 | Ht 65.0 in | Wt 235.0 lb

## 2021-01-27 DIAGNOSIS — Z1211 Encounter for screening for malignant neoplasm of colon: Secondary | ICD-10-CM

## 2021-01-27 MED ORDER — SODIUM CHLORIDE 0.9 % IV SOLN
500.0000 mL | Freq: Once | INTRAVENOUS | Status: DC
Start: 2021-01-27 — End: 2021-01-27

## 2021-01-27 NOTE — Op Note (Signed)
[facility] Patient Name: Kayla Moody Procedure Date: 01/27/2021 3:15 PM MRN: [id number] Endoscopist: Iva Boop , MD Age: 55 Referring MD:  Date of Birth: 15-Mar-1965 Gender: Female Account #: [id number] Procedure:                Colonoscopy Indications:              Screening for colorectal malignant neoplasm Medicines:                Propofol per Anesthesia, Monitored Anesthesia Care Procedure:                Pre-Anesthesia Assessment:                           - Prior to the procedure, a History and Physical                            was performed, and patient medications and                            allergies were reviewed. The patient's tolerance of                            previous anesthesia was also reviewed. The risks                            and benefits of the procedure and the sedation                            options and risks were discussed with the patient.                            All questions were answered, and informed consent                            was obtained. Prior Anticoagulants: The patient has                            taken no previous anticoagulant or antiplatelet                            agents. ASA Grade Assessment: II - A patient with                            mild systemic disease. After reviewing the risks                            and benefits, the patient was deemed in                            satisfactory condition to undergo the procedure.                           After obtaining informed consent, the colonoscope  was passed under direct vision. Throughout the                            procedure, the patient's blood pressure, pulse, and                            oxygen saturations were monitored continuously. The                            Olympus PCF-H190DL (#[id number]) Colonoscope was                            introduced through the anus and advanced to the the                             cecum, identified by appendiceal orifice and                            ileocecal valve. The appendiceal orifice and the                            rectum were photographed. The quality of the bowel                            preparation was good. The colonoscopy was performed                            without difficulty. The patient tolerated the                            procedure well. The bowel preparation used was                            Miralax via split dose instruction. Scope In: 3:30:12 PM Scope Out: 3:45:35 PM Scope Withdrawal Time: 0 hours 11 minutes 20 seconds  Total Procedure Duration: 0 hours 15 minutes 23 seconds  Findings:                 The perianal and digital rectal examinations were                            normal.                           The colon (entire examined portion) appeared normal.                           No additional abnormalities were found on                            retroflexion. Complications:            No immediate complications. Estimated blood loss:                            None. Estimated Blood Loss:  Estimated blood loss: none. Recommendation:           - Repeat colonoscopy in 10 years for screening                            purposes.                           - Patient has a contact number available for                            emergencies. The signs and symptoms of potential                            delayed complications were discussed with the                            patient. Return to normal activities tomorrow.                            Written discharge instructions were provided to the                            patient.                           - Resume previous diet.                           - Continue present medications. Iva Boop, MD 01/27/2021 3:55:52 PM This report has been signed electronically.

## 2021-01-27 NOTE — Progress Notes (Signed)
[facility] History and Physical   Primary Care Physician:  Patient, No Pcp Per (Inactive)   Reason for Procedure:   Colon cancer screening  Plan:    colonoscopy     HPI: Kayla Moody is a 55 y.o. female here for screening colonoscopy. Recently admitted w/ upper GI bleed and had H pylori gastritis.     Past Medical History:  Diagnosis Date   Allergy    H. pylori duodenitis 12/02/2020   Helicobacter pylori gastritis 11/30/2020   Hypertension     Past Surgical History:  Procedure Laterality Date   BIOPSY  11/29/2020   Procedure: BIOPSY;  Surgeon: Iva Boop, MD;  Location: [facility] ENDOSCOPY;  Service: Endoscopy;;   CHOLECYSTECTOMY     ESOPHAGOGASTRODUODENOSCOPY (EGD) WITH PROPOFOL N/A 11/29/2020   Procedure: ESOPHAGOGASTRODUODENOSCOPY (EGD) WITH PROPOFOL;  Surgeon: Iva Boop, MD;  Location: [facility] ENDOSCOPY;  Service: Endoscopy;  Laterality: N/A;   HERNIA REPAIR      Prior to Admission medications   Medication Sig Start Date End Date Taking? Authorizing Provider  amLODipine (NORVASC) 10 MG tablet TAKE 1 TABLET BY MOUTH DAILY Patient taking differently: Take 10 mg by mouth daily. 05/19/18  Yes Sagardia, Eilleen Kempf, MD  diclofenac Sodium (VOLTAREN) 1 % GEL Apply topically. 12/21/20  Yes [provider]  pantoprazole (PROTONIX) 40 MG tablet Take 1 tablet (40 mg total) by mouth daily. 01/03/21  Yes Esterwood, Amy S, PA-C  olmesartan-hydrochlorothiazide (BENICAR HCT) 40-12.5 MG tablet Take 1 tablet by mouth daily. 11/10/20   [provider]    Current Outpatient Medications  Medication Sig Dispense Refill   amLODipine (NORVASC) 10 MG tablet TAKE 1 TABLET BY MOUTH DAILY (Patient taking differently: Take 10 mg by mouth daily.) 42 tablet 0   diclofenac Sodium (VOLTAREN) 1 % GEL Apply topically.     pantoprazole (PROTONIX) 40 MG tablet Take 1 tablet (40 mg total) by mouth daily. 30 tablet 0   olmesartan-hydrochlorothiazide (BENICAR HCT) 40-12.5 MG  tablet Take 1 tablet by mouth daily.     Current Facility-Administered Medications  Medication Dose Route Frequency Provider Last Rate Last Admin   0.9 %  sodium chloride infusion  500 mL Intravenous Once Iva Boop, MD        Allergies as of 01/27/2021 - Review Complete 01/27/2021  Allergen Reaction Noted   Penicillins Anaphylaxis and Other (See Comments) 06/08/2015    Family History  Problem Relation Age of Onset   Colon polyps Neg Hx    Colon cancer Neg Hx    Stomach cancer Neg Hx    Esophageal cancer Neg Hx    Rectal cancer Neg Hx     Social History   Socioeconomic History   Marital status: Widowed    Spouse name: Not on file   Number of children: 2   Years of education: Not on file   Highest education level: Not on file  Occupational History   Not on file  Tobacco Use   Smoking status: Never   Smokeless tobacco: Never  Vaping Use   Vaping Use: Never used  Substance and Sexual Activity   Alcohol use: No    Alcohol/week: 0.0 standard drinks   Drug use: No   Sexual activity: Never  Other Topics Concern   Review of Systems:  All other review of systems negative except as mentioned in the HPI.  Physical Exam: Vital signs BP 129/76   Pulse 68   Temp 98 F (36.7 C)   Ht 5'  5" (1.651 m)   Wt 235 lb (106.6 kg)   SpO2 100%   BMI 39.11 kg/m   General:   Alert,  Well-developed, well-nourished, pleasant and cooperative in NAD Lungs:  Clear throughout to auscultation.   Heart:  Regular rate and rhythm; no murmurs, clicks, rubs,  or gallops. Abdomen:  Soft, nontender and nondistended. Normal bowel sounds.   Neuro/Psych:  Alert and cooperative. Normal mood and affect. A and O x 3   @Myah Guynes  , MD, [facility] [phone] (pager) 01/27/2021 3:22 PM@

## 2021-01-27 NOTE — Patient Instructions (Addendum)
No polyps - colonoscopy was normal.  Please complete the treatment with pantoprazole and do the stool test for H pylori when planned as per Amy Esterwood, PA-C.  I appreciate the opportunity to care for you. Iva Boop, MD, FACG  YOU HAD AN ENDOSCOPIC PROCEDURE TODAY AT THE [facility]:   Refer to the procedure report that was given to you for any specific questions about what was found during the examination.  If the procedure report does not answer your questions, please call your gastroenterologist to clarify.  If you requested that your care partner not be given the details of your procedure findings, then the procedure report has been included in a sealed envelope for you to review at your convenience later.  YOU SHOULD EXPECT: Some feelings of bloating in the abdomen. Passage of more gas than usual.  Walking can help get rid of the air that was put into your GI tract during the procedure and reduce the bloating. If you had a lower endoscopy (such as a colonoscopy or flexible sigmoidoscopy) you may notice spotting of blood in your stool or on the toilet paper. If you underwent a bowel prep for your procedure, you may not have a normal bowel movement for a few days.  Please Note:  You might notice some irritation and congestion in your nose or some drainage.  This is from the oxygen used during your procedure.  There is no need for concern and it should clear up in a day or so.  SYMPTOMS TO REPORT IMMEDIATELY:  Following lower endoscopy (colonoscopy or flexible sigmoidoscopy):  Excessive amounts of blood in the stool  Significant tenderness or worsening of abdominal pains  Swelling of the abdomen that is new, acute  Fever of 100F or higher  For urgent or emergent issues, a gastroenterologist can be reached at any hour by calling [phone]. Do not use MyChart messaging for urgent concerns.    DIET:  We do recommend a small meal at first, but then you may proceed to  your regular diet.  Drink plenty of fluids but you should avoid alcoholic beverages for 24 hours.  ACTIVITY:  You should plan to take it easy for the rest of today and you should NOT DRIVE or use heavy machinery until tomorrow (because of the sedation medicines used during the test).    FOLLOW UP: Our staff will call the number listed on your records 48-72 hours following your procedure to check on you and address any questions or concerns that you may have regarding the information given to you following your procedure. If we do not reach you, we will leave a message.  We will attempt to reach you two times.  During this call, we will ask if you have developed any symptoms of COVID 19. If you develop any symptoms (ie: fever, flu-like symptoms, shortness of breath, cough etc.) before then, please call [phone].  If you test positive for Covid 19 in the 2 weeks post procedure, please call and report this information to [place].    If any biopsies were taken you will be contacted by phone or by letter within the next 1-3 weeks.  Please call us at [phone] if you have not heard about the biopsies in 3 weeks.    SIGNATURES/CONFIDENTIALITY: You and/or your care partner have signed paperwork which will be entered into your electronic medical record.  These signatures attest to the fact that that the information above on your After Visit  Summary has been reviewed and is understood.  Full responsibility of the confidentiality of this discharge information lies with you and/or your care-partner.  

## 2021-01-27 NOTE — Progress Notes (Signed)
PT taken to PACU. Monitors in place. VSS. Report given to RN. 

## 2021-01-31 ENCOUNTER — Telehealth: Payer: Self-pay | Admitting: *Deleted

## 2021-01-31 ENCOUNTER — Telehealth: Payer: Self-pay

## 2021-01-31 NOTE — Telephone Encounter (Signed)
  Follow up Call-  Call back number 01/27/2021  Post procedure Call Back phone  # [phone]  Permission to leave phone message Yes  Some recent data might be hidden     Left message

## 2021-01-31 NOTE — Telephone Encounter (Signed)
  Follow up Call-  Call back number 01/27/2021  Post procedure Call Back phone  # [phone]  Permission to leave phone message Yes  Some recent data might be hidden     Patient questions:  Do you have a fever, pain , or abdominal swelling? No. Pain Score  0 *  Have you tolerated food without any problems? Yes.    Have you been able to return to your normal activities? Yes.    Do you have any questions about your discharge instructions: Diet   No. Medications  No. Follow up visit  No.  Do you have questions or concerns about your Care? No.  Actions: * If pain score is 4 or above: No action needed, pain <4.  Have you developed a fever since your procedure? no  2.   Have you had an respiratory symptoms (SOB or cough) since your procedure? no  3.   Have you tested positive for COVID 19 since your procedure no  4.   Have you had any family members/close contacts diagnosed with the COVID 19 since your procedure?  no   If yes to any of these questions please route to Laverna Peace, RN and Karlton Lemon, RN

## 2021-03-23 ENCOUNTER — Emergency Department (HOSPITAL_COMMUNITY): Payer: Managed Care, Other (non HMO)

## 2021-03-23 ENCOUNTER — Encounter (HOSPITAL_COMMUNITY): Payer: Self-pay

## 2021-03-23 ENCOUNTER — Emergency Department (HOSPITAL_COMMUNITY)
Admission: EM | Admit: 2021-03-23 | Discharge: 2021-03-23 | Disposition: A | Discharge status: Home / Self Care | Payer: Managed Care, Other (non HMO) | Attending: Emergency Medicine | Admitting: Emergency Medicine

## 2021-03-23 ENCOUNTER — Other Ambulatory Visit: Payer: Self-pay

## 2021-03-23 DIAGNOSIS — R059 Cough, unspecified: Secondary | ICD-10-CM | POA: Diagnosis present

## 2021-03-23 DIAGNOSIS — J069 Acute upper respiratory infection, unspecified: Secondary | ICD-10-CM | POA: Diagnosis not present

## 2021-03-23 DIAGNOSIS — Z20822 Contact with and (suspected) exposure to covid-19: Secondary | ICD-10-CM | POA: Insufficient documentation

## 2021-03-23 DIAGNOSIS — J3489 Other specified disorders of nose and nasal sinuses: Secondary | ICD-10-CM | POA: Insufficient documentation

## 2021-03-23 LAB — RESP PANEL BY RT-PCR (FLU A&B, COVID) ARPGX2
Influenza A by PCR: NEGATIVE
Influenza B by PCR: NEGATIVE
SARS Coronavirus 2 by RT PCR: NEGATIVE

## 2021-03-23 MED ORDER — BENZONATATE 100 MG PO CAPS: 100.0000 mg | ORAL_CAPSULE | Freq: Three times a day (TID) | ORAL | 0 refills | Status: DC

## 2021-03-23 NOTE — Discharge Instructions (Addendum)
It was a pleasure taking care of you today!  Your COVID and flu swab was negative today. Your chest xray didn't show pneumonia. You may follow up with your primary care provider for evaluation of your blood pressure, continue to take your blood pressure medications as prescribed. You may continue taking the over the counter medications that you have been taking along with tea and cough drops. You will be sent a prescription for tessalon perles, take as prescribed. Ensure to maintain fluid intake.  You may follow-up with your primary care provider as needed.  Return to the Emergency Department if you are experiencing trouble breathing, worsening or increasing chest pain, decreased fluid intake or worsening symptoms.

## 2021-03-23 NOTE — ED Triage Notes (Signed)
Pt reports productive cough x 3 days associated with runny nose; covid tests done at her job are negative. Pt also expresses concern about her elevated BP - 163/102

## 2021-03-23 NOTE — ED Provider Triage Note (Signed)
Emergency Medicine Provider Triage Evaluation Note  Kayla Moody , a 56 y.o. female  was evaluated in triage.  Pt complains of dry cough for the past 3 days.  Patient also complains of mild headache, congestion, and sore throat that has since resolved.  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  She has taken multiple antigen COVID tests at work which have all returned negative.  She is vaccinated against COVID.  She did not receive her flu vaccine.  She has been taking over-the-counter medications without much relief.  She does admit that she is concerned about her blood pressure being slightly elevated at 163/102 today.  She has been compliant with her blood pressure medications.  Review of Systems  Positive: + cough, headache, congestion, sore throat Negative: - body aches, diarrhea, SOB  Physical Exam  BP (!) 163/102 (BP Location: Left Arm)    Pulse 79    Temp 99 F (37.2 C) (Oral)    Resp 16    Ht 5\' 6"  (1.676 m)    Wt 106.6 kg    SpO2 96%    BMI 37.93 kg/m  Gen:   Awake, no distress   Resp:  Normal effort  MSK:   Moves extremities without difficulty  Other:    Medical Decision Making  Medically screening exam initiated at 3:34 PM.  Appropriate orders placed.  Kayla Moody was informed that the remainder of the evaluation will be completed by another provider, this initial triage assessment does not replace that evaluation, and the importance of remaining in the ED until their evaluation is complete.     Jannet Askew, PA-C 03/23/21 1535

## 2021-03-23 NOTE — ED Notes (Signed)
Dc instructions and scripts reviewed with pt no questions or concerns at this time. Will follow up with pcp.  

## 2021-03-23 NOTE — ED Provider Notes (Signed)
[facility]-EMERGENCY DEPT Provider Note   CSN: [id number] Arrival date & time: 03/23/21  1409     History  Chief Complaint  Patient presents with   Cough   Kayla Moody is a 56 y.o. female who presents to the Emergency Department complaining of productive cough onset 3 days.  Patient had a negative COVID test on her.  Patient has associated sick contacts.  Patient has associated rhinorrhea.  She has tried OTC theraflu, tea, and cough drops with no relief of her symptoms.  Denies fever, chills, nasal congestion, sore throat, trouble swallowing, chest pain, shortness of breath, abdominal pain, nausea, vomiting.  Denies history of asthma or COPD.     The history is provided by the patient. No language interpreter was used.      Home Medications Prior to Admission medications   Medication Sig Start Date End Date Taking? Authorizing Provider  benzonatate (TESSALON) 100 MG capsule Take 1 capsule (100 mg total) by mouth every 8 (eight) hours. 03/23/21  Yes Amyrie Illingworth A, PA-C  amLODipine (NORVASC) 10 MG tablet TAKE 1 TABLET BY MOUTH DAILY Patient taking differently: Take 10 mg by mouth daily. 05/19/18   Georgina Quint, MD  diclofenac Sodium (VOLTAREN) 1 % GEL Apply topically. 12/21/20   [provider]  olmesartan-hydrochlorothiazide (BENICAR HCT) 40-12.5 MG tablet Take 1 tablet by mouth daily. 11/10/20   [provider]  pantoprazole (PROTONIX) 40 MG tablet Take 1 tablet (40 mg total) by mouth daily. 01/03/21   Esterwood, Amy S, PA-C      Allergies    Penicillins    Review of Systems   Review of Systems  Constitutional:  Negative for chills and fever.  HENT:  Positive for rhinorrhea. Negative for congestion, sore throat and trouble swallowing.   Respiratory:  Positive for cough. Negative for shortness of breath.   Cardiovascular:  Negative for chest pain.  Gastrointestinal:  Negative for abdominal pain, nausea and vomiting.  Skin:   Negative for rash.  All other systems reviewed and are negative.  Physical Exam Updated Vital Signs BP (!) 153/97 (BP Location: Left Arm)    Pulse 75    Temp 98.4 F (36.9 C) (Oral)    Resp 16    Ht 5\' 6"  (1.676 m)    Wt 106.6 kg    SpO2 98%    BMI 37.93 kg/m  Physical Exam Vitals and nursing note reviewed.  Constitutional:      General: She is not in acute distress.    Appearance: She is not diaphoretic.  HENT:     Head: Normocephalic and atraumatic.     Nose: Nose normal. No congestion or rhinorrhea.     Mouth/Throat:     Mouth: Mucous membranes are moist.     Pharynx: Oropharynx is clear. No oropharyngeal exudate or posterior oropharyngeal erythema.  Eyes:     General: No scleral icterus.    Conjunctiva/sclera: Conjunctivae normal.  Cardiovascular:     Rate and Rhythm: Normal rate and regular rhythm.     Pulses: Normal pulses.     Heart sounds: Normal heart sounds.  Pulmonary:     Effort: Pulmonary effort is normal. No respiratory distress.     Breath sounds: Normal breath sounds. No wheezing.  Chest:     Chest wall: No tenderness.  Abdominal:     General: Bowel sounds are normal.     Palpations: Abdomen is soft. There is no mass.     Tenderness: There is  no abdominal tenderness. There is no guarding or rebound.  Musculoskeletal:        General: Normal range of motion.     Cervical back: Normal range of motion and neck supple.  Skin:    General: Skin is warm and dry.  Neurological:     Mental Status: She is alert.  Psychiatric:        Behavior: Behavior normal.    ED Results / Procedures / Treatments   Labs (all labs ordered are listed, but only abnormal results are displayed) Labs Reviewed  RESP PANEL BY RT-PCR (FLU A&B, COVID) [id number]    EKG None  Radiology DG Chest 2 View  Result Date: 03/23/2021 CLINICAL DATA:  Productive cough for the past 3 days. Rhinorrhea. Negative COVID-19 tests. Hypertension. EXAM: CHEST - 2 VIEW COMPARISON:  None. FINDINGS:  Normal sized heart. Clear lungs with normal vascularity. Mild thoracic spine degenerative changes. Cholecystectomy clips. IMPRESSION: No acute abnormality. Electronically Signed   By: Beckie Salts M.D.   On: 03/23/2021 17:02    Procedures Procedures    Medications Ordered in ED Medications - No data to display  ED Course/ Medical Decision Making/ A&P Clinical Course as of 03/23/21 1740  Thu Mar 23, 2021  1712 Re-evaluated notified of chest xray findings. Discussed discharge treatment plan. Pt agreeable at this time. Pt appears safe for discharge. [SB]    Clinical Course User Index [SB] Lucendia Leard A, PA-C                           Medical Decision Making  Patient with productive cough and rhinorrhea x3 days.  Denies sick contacts.  Vital signs stable, patient afebrile, not hypoxic or tachycardic. On exam patient without acute cardiovascular, pulmonary, abdominal exam without acute findings. Differential diagnosis includes COVID, Flu, pneumonia, or viral URI with cough.  Labs:  I ordered, and personally interpreted labs.  The pertinent results include: COVID and flu swab, negative  Imaging: I ordered imaging studies including chest x-ray I independently visualized and interpreted imaging which showed no acute fracture, dislocation, consolidation I agree with the radiologist interpretation   Disposition: Patient presentation suspicious for viral URI with cough.  Doubt pneumonia, COVID, flu at this time. After consideration of the diagnostic results, I feel that the patent would benefit from discharge home with [facility]. Supportive care measures and strict return precautions discussed with patient at bedside. Pt acknowledges and verbalizes understanding. Pt appears safe for discharge. Follow up as indicated in discharge paperwork.   This chart was dictated using voice recognition software, Dragon. Despite the best efforts of this provider to proofread and correct errors,  errors may still occur which can change documentation meaning.   Final Clinical Impression(s) / ED Diagnoses Final diagnoses:  Viral URI with cough    Rx / DC Orders ED Discharge Orders          Ordered    benzonatate (TESSALON) 100 MG capsule  Every 8 hours        03/23/21 1712              Curtis Cain A, PA-C 03/23/21 1740    Mancel Bale, MD 03/24/21 1258

## 2021-04-24 ENCOUNTER — Telehealth: Payer: Self-pay | Admitting: Orthopaedic Surgery

## 2021-04-24 NOTE — Telephone Encounter (Signed)
Received medical records release form from patient  

## 2021-06-17 ENCOUNTER — Other Ambulatory Visit: Payer: Self-pay

## 2021-06-17 ENCOUNTER — Emergency Department (HOSPITAL_COMMUNITY): Payer: Managed Care, Other (non HMO)

## 2021-06-17 ENCOUNTER — Encounter (HOSPITAL_COMMUNITY): Payer: Self-pay

## 2021-06-17 ENCOUNTER — Emergency Department (HOSPITAL_COMMUNITY)
Admission: EM | Admit: 2021-06-17 | Discharge: 2021-06-18 | Disposition: A | Discharge status: Home / Self Care | Payer: Managed Care, Other (non HMO) | Attending: Emergency Medicine | Admitting: Emergency Medicine

## 2021-06-17 DIAGNOSIS — Z8679 Personal history of other diseases of the circulatory system: Secondary | ICD-10-CM

## 2021-06-17 DIAGNOSIS — Z79899 Other long term (current) drug therapy: Secondary | ICD-10-CM | POA: Diagnosis not present

## 2021-06-17 DIAGNOSIS — I10 Essential (primary) hypertension: Secondary | ICD-10-CM | POA: Diagnosis not present

## 2021-06-17 DIAGNOSIS — R072 Precordial pain: Secondary | ICD-10-CM | POA: Diagnosis not present

## 2021-06-17 DIAGNOSIS — R0789 Other chest pain: Secondary | ICD-10-CM | POA: Diagnosis present

## 2021-06-17 LAB — CBC
HCT: 43.2 % (ref 36.0–46.0)
Hemoglobin: 14.8 g/dL (ref 12.0–15.0)
MCH: 30.2 pg (ref 26.0–34.0)
MCHC: 34.3 g/dL (ref 30.0–36.0)
MCV: 88.2 fL (ref 80.0–100.0)
Platelets: 432 10*3/uL — ABNORMAL HIGH (ref 150–400)
RBC: 4.9 MIL/uL (ref 3.87–5.11)
RDW: 12.7 % (ref 11.5–15.5)
WBC: 7.3 10*3/uL (ref 4.0–10.5)
nRBC: 0 % (ref 0.0–0.2)

## 2021-06-17 NOTE — ED Triage Notes (Signed)
Patient having chest pain that started 2 days ago that is right in the center of her chest. No radiation.  ?

## 2021-06-17 NOTE — ED Notes (Signed)
Save tubes blue top and grey top on ice sent to main lab ?

## 2021-06-18 LAB — BASIC METABOLIC PANEL
Anion gap: 9 (ref 5–15)
BUN: 10 mg/dL (ref 6–20)
CO2: 25 mmol/L (ref 22–32)
Calcium: 9.2 mg/dL (ref 8.9–10.3)
Chloride: 108 mmol/L (ref 98–111)
Creatinine, Ser: 0.66 mg/dL (ref 0.44–1.00)
GFR, Estimated: >60 mL/min (ref >60)
Glucose, Bld: 165 mg/dL — ABNORMAL HIGH (ref 70–99)
Potassium: 3.3 mmol/L — ABNORMAL LOW (ref 3.5–5.1)
Sodium: 142 mmol/L (ref 135–145)

## 2021-06-18 LAB — D-DIMER, QUANTITATIVE: D-Dimer, Quant: 0.28 ug/mL-FEU (ref 0.00–0.50)

## 2021-06-18 LAB — TROPONIN I (HIGH SENSITIVITY)
Troponin I (High Sensitivity): 23 ng/L — ABNORMAL HIGH (ref <18)
Troponin I (High Sensitivity): 20 ng/L — ABNORMAL HIGH (ref <18)

## 2021-06-18 NOTE — ED Provider Notes (Signed)
?[facility]-EMERGENCY DEPT ?Provider Note ? ? ?CSN: [id number] ?Arrival date & time: 06/17/21  2231 ? ?  ? ?History ? ?Chief Complaint  ?Patient presents with  ? Chest Pain  ? ? ?Kayla Moody is a 56 y.o. female here for evaluation of pleuritic chest pain.  Located center of chest.  Does not radiate into back, left arm or jaw.  No abdominal pain.  No associate diaphoresis, nausea, vomiting, shortness of breath.  No cough, congestion, rhinorrhea.  No history of PE or DVT.  No unilateral leg swelling, redness, warmth, recent surgery, immobilization, malignancy.  She does not get chest pain at baseline with exertion and is able to complete her ADLs without any DOE or chest pain.  She is not followed by cardiology.  She does have history of hypertension.  No meds PTA.  Pain is intermittent in nature. ? ?HPI ? ?  ? ?Home Medications ?Prior to Admission medications   ?Medication Sig Start Date End Date Taking? Authorizing Provider  ?amLODipine (NORVASC) 10 MG tablet TAKE 1 TABLET BY MOUTH DAILY ?Patient taking differently: Take 10 mg by mouth daily. 05/19/18  Yes Sagardia, Eilleen KempfMiguel Jose, MD  ?cloNIDine (CATAPRES) 0.2 MG tablet Take 0.2 mg by mouth 2 (two) times daily as needed (high blood pressure). 06/16/21  Yes [provider]  ?olmesartan-hydrochlorothiazide (BENICAR HCT) 40-12.5 MG tablet Take 1 tablet by mouth daily. 11/10/20  Yes [provider]  ?OVER THE COUNTER MEDICATION Apply 1 application. topically every 8 (eight) hours. Multivitamin Patch   Yes [provider]  ?benzonatate (TESSALON) 100 MG capsule Take 1 capsule (100 mg total) by mouth every 8 (eight) hours. ?Patient not taking: Reported on 06/18/2021 03/23/21   Blue, Soijett A, PA-C  ?pantoprazole (PROTONIX) 40 MG tablet Take 1 tablet (40 mg total) by mouth daily. ?Patient not taking: Reported on 06/18/2021 01/03/21   Sammuel CooperEsterwood, Amy S, PA-C  ?   ? ?Allergies    ?Penicillins   ? ?Review of Systems   ?Review of Systems   ?HENT: Negative.    ?Respiratory: Negative.    ?Cardiovascular:  Positive for chest pain. Negative for palpitations and leg swelling.  ?Gastrointestinal: Negative.   ?Genitourinary: Negative.   ?Musculoskeletal: Negative.   ?Skin: Negative.   ?Neurological: Negative.   ?All other systems reviewed and are negative. ? ?Physical Exam ?Updated Vital Signs ?BP 138/79   Pulse 65   Temp 97.7 ?F (36.5 ?C) (Oral)   Resp 15   Ht 5\' 6"  (1.676 m)   Wt 99.8 kg   SpO2 97%   BMI 35.51 kg/m?  ?Physical Exam ?Vitals and nursing note reviewed.  ?Constitutional:   ?   General: She is not in acute distress. ?   Appearance: She is well-developed. She is not ill-appearing, toxic-appearing or diaphoretic.  ?HENT:  ?   Head: Atraumatic.  ?Eyes:  ?   Pupils: Pupils are equal, round, and reactive to light.  ?Cardiovascular:  ?   Rate and Rhythm: Normal rate.  ?   Pulses:     ?     Radial pulses are 2+ on the right side and 2+ on the left side.  ?     Dorsalis pedis pulses are 2+ on the right side and 2+ on the left side.  ?   Heart sounds: Normal heart sounds.  ?Pulmonary:  ?   Effort: Pulmonary effort is normal. No respiratory distress.  ?   Breath sounds: Normal breath sounds.  ?   Comments:  Clear bilaterally, speaks in full sentences without difficulty. ?Chest:  ?   Comments: Nontender, no crepitus or step-off. ?Abdominal:  ?   General: Bowel sounds are normal. There is no distension.  ?   Palpations: Abdomen is soft.  ?   Comments: Soft, nontender without rebound or guarding  ?Musculoskeletal:     ?   General: Normal range of motion.  ?   Cervical back: Normal range of motion.  ?   Right lower leg: No tenderness. No edema.  ?   Left lower leg: No tenderness. No edema.  ?   Comments: Full range of motion, no bony tenderness, compartments soft, no palpable cords, nontender posterior calves.  Denna Haggard' sign negative  ?Skin: ?   General: Skin is warm and dry.  ?   Capillary Refill: Capillary refill takes less than 2 seconds.  ?    Comments: No edema, erythema or warmth.  No fluctuance or induration.  ?Neurological:  ?   General: No focal deficit present.  ?   Mental Status: She is alert.  ?   Comments: Intact sensation, equal grip, cranial nerves II through XII grossly intact, ambulatory without difficulty or gait changes  ?Psychiatric:     ?   Mood and Affect: Mood normal.  ? ? ?ED Results / Procedures / Treatments   ?Labs ?(all labs ordered are listed, but only abnormal results are displayed) ?Labs Reviewed  ?BASIC METABOLIC PANEL - Abnormal; Notable for the following components:  ?    Result Value  ? Potassium 3.3 (*)   ? Glucose, Bld 165 (*)   ? All other components within normal limits  ?CBC - Abnormal; Notable for the following components:  ? Platelets 432 (*)   ? All other components within normal limits  ?TROPONIN I (HIGH SENSITIVITY) - Abnormal; Notable for the following components:  ? Troponin I (High Sensitivity) 23 (*)   ? All other components within normal limits  ?TROPONIN I (HIGH SENSITIVITY) - Abnormal; Notable for the following components:  ? Troponin I (High Sensitivity) 20 (*)   ? All other components within normal limits  ?D-DIMER, QUANTITATIVE  ? ? ?EKG ?EKG Interpretation ? ?Date/Time:  Saturday June 17 2021 22:37:40 EDT ?Ventricular Rate:  82 ?PR Interval:  152 ?QRS Duration: 82 ?QT Interval:  378 ?QTC Calculation: 442 ?R Axis:   34 ?Text Interpretation: Sinus rhythm Low voltage, precordial leads Confirmed by Kennis Carina [phone]) on 06/18/2021 2:06:32 AM ? ?Radiology ?DG Chest 2 View ? ?Result Date: 06/17/2021 ?CLINICAL DATA:  Chest pain. EXAM: CHEST - 2 VIEW COMPARISON:  None. FINDINGS: The heart size and mediastinal contours are within normal limits. Both lungs are clear. Radiopaque surgical clips are seen within the right upper quadrant. Radiopaque surgical screws are seen within the right humeral head. The visualized skeletal structures are otherwise unremarkable. IMPRESSION: No active cardiopulmonary disease.  Electronically Signed   By: Aram Candela M.D.   On: 06/17/2021 23:02   ? ?Procedures ?Procedures  ? ? ?Medications Ordered in ED ?Medications - No data to display ? ?ED Course/ Medical Decision Making/ A&P ?  ? ?56 year old here for evaluation of pleuritic chest pain over the last few days.  Afebrile, nonseptic, not ill-appearing.  No radiation of pain.  She has never had anything like this previously.  She has no exertional chest pain.  No chest pain with ADLs.  No associate diaphoresis, nausea, vomiting or shortness of breath.  No abdominal pain GERD type symptoms.  No clinical evidence  of VTE on exam.  No cough.  Pain is nonreproducible.  Not taking any meds for this at home.  Reviewed her prior history, had CTA AP study September of last year that any evidence of AAA.  Denies prior history of dissection.  Plan on labs, imaging and reassess.  She does not anything for pain at this time. ? ?Labs and imaging personally viewed and interpreted: ? ?CBC without leukocytosis ?BMP potassium 3.3, glucose 165 ?Ddimer 0.28 ?Trop 23, 20.Marland Kitchen No prior to compare ?Dg chest without cardiomegaly, edema, pneumothorax, infiltrates ? ?Patient reassessed.  Chest pain-free.  Discussed her labs and imaging.  History sounds atypical for ACS.  Reassuring D-dimer 0.28.  Overall low suspicion for PE.  She has no back pain, abdominal pain is neurovascular intact low suspicion for dissection.  No cough, shortness of breath low suspicion for infectious process.  She does not appear clinically fluid overloaded and has normal vital signs.  Heart score 2, age, Trop.  Will DC home with strict return precautions, or follow-up with PCP, return for any worsening symptoms.  Patient agreeable. ? ?The patient has been appropriately medically screened and/or stabilized in the ED. I have low suspicion for any other emergent medical condition which would require further screening, evaluation or treatment in the ED or require inpatient  management. ? ?Patient is hemodynamically stable and in no acute distress.  Patient able to ambulate in department prior to ED.  Evaluation does not show acute pathology that would require ongoing or additional emergent interventions while

## 2021-06-18 NOTE — Discharge Instructions (Addendum)
Your work-up here in the emergency department is reassuring. ? ?If you develop worsening chest pain, associated shortness of breath, chest pain with radiation to back, arm or jaw, lower remedy swelling please seek reevaluation in the emergency department ? ?Otherwise follow-up with your primary care provider ? ?Return for new or worsening symptoms. ?

## 2021-06-18 NOTE — ED Notes (Signed)
Discharge instructions reviewed, questions answered. OTC rx education provided. Pt encouraged to return for worsening symptoms. Pt states understanding and no further questions. Pt ambulatory with steady gait upon discharge. No s/s of distress noted. ? ?

## 2022-02-12 ENCOUNTER — Telehealth: Payer: Self-pay | Admitting: Orthopaedic Surgery

## 2022-02-12 NOTE — Telephone Encounter (Signed)
Sure we can apply for them.  Thanks.

## 2022-02-12 NOTE — Telephone Encounter (Signed)
Patient would like gel injections in knees has not been seen since 01/2021 advised patient she may have to come in for appt first

## 2022-02-13 NOTE — Telephone Encounter (Signed)
Ok she'll have to come back then.  Thanks.

## 2022-02-13 NOTE — Telephone Encounter (Signed)
Tried to call patient to schedule appointment. Call couldn't be completed as dialed.

## 2022-02-13 NOTE — Telephone Encounter (Signed)
I would need an updated office note to send to the insurance for gel injection to be approved since she hasn't been seen since the last one.

## 2022-05-07 LAB — LAB REPORT - SCANNED: EGFR: 108

## 2022-12-05 ENCOUNTER — Other Ambulatory Visit: Payer: Self-pay | Admitting: Nurse Practitioner

## 2022-12-05 ENCOUNTER — Ambulatory Visit (INDEPENDENT_AMBULATORY_CARE_PROVIDER_SITE_OTHER): Payer: 59 | Admitting: Nurse Practitioner

## 2022-12-05 ENCOUNTER — Encounter: Payer: Self-pay | Admitting: Nurse Practitioner

## 2022-12-05 VITALS — BP 153/84 | HR 65 | Wt 231.4 lb

## 2022-12-05 DIAGNOSIS — M170 Bilateral primary osteoarthritis of knee: Secondary | ICD-10-CM

## 2022-12-05 DIAGNOSIS — M19031 Primary osteoarthritis, right wrist: Secondary | ICD-10-CM

## 2022-12-05 DIAGNOSIS — I10 Essential (primary) hypertension: Secondary | ICD-10-CM

## 2022-12-05 DIAGNOSIS — R7303 Prediabetes: Secondary | ICD-10-CM | POA: Diagnosis not present

## 2022-12-05 DIAGNOSIS — M25512 Pain in left shoulder: Secondary | ICD-10-CM

## 2022-12-05 DIAGNOSIS — M25511 Pain in right shoulder: Secondary | ICD-10-CM

## 2022-12-05 DIAGNOSIS — Z1231 Encounter for screening mammogram for malignant neoplasm of breast: Secondary | ICD-10-CM | POA: Diagnosis not present

## 2022-12-05 DIAGNOSIS — Z1329 Encounter for screening for other suspected endocrine disorder: Secondary | ICD-10-CM

## 2022-12-05 DIAGNOSIS — Z130 Encounter for screening for diseases of the blood and blood-forming organs and certain disorders involving the immune mechanism: Secondary | ICD-10-CM

## 2022-12-05 DIAGNOSIS — Z13228 Encounter for screening for other metabolic disorders: Secondary | ICD-10-CM

## 2022-12-05 DIAGNOSIS — R011 Cardiac murmur, unspecified: Secondary | ICD-10-CM

## 2022-12-05 DIAGNOSIS — Z1321 Encounter for screening for nutritional disorder: Secondary | ICD-10-CM

## 2022-12-05 DIAGNOSIS — G8929 Other chronic pain: Secondary | ICD-10-CM | POA: Insufficient documentation

## 2022-12-05 HISTORY — DX: Bilateral primary osteoarthritis of knee: M17.0

## 2022-12-05 HISTORY — DX: Primary osteoarthritis, right wrist: M19.031

## 2022-12-05 LAB — POCT GLYCOSYLATED HEMOGLOBIN (HGB A1C): Hemoglobin A1C: 6.3 % — AB (ref 4.0–5.6)

## 2022-12-05 MED ORDER — CHLORTHALIDONE 25 MG PO TABS
25.0000 mg | ORAL_TABLET | Freq: Every day | ORAL | 0 refills | Status: DC
Start: 2022-12-05 — End: 2022-12-05

## 2022-12-05 MED ORDER — AMLODIPINE BESYLATE 10 MG PO TABS
10.0000 mg | ORAL_TABLET | Freq: Every day | ORAL | 1 refills | Status: DC
Start: 2022-12-05 — End: 2023-07-03

## 2022-12-05 MED ORDER — OLMESARTAN MEDOXOMIL 40 MG PO TABS: 40.0000 mg | ORAL_TABLET | Freq: Every day | ORAL | 0 refills | Status: DC

## 2022-12-05 MED ORDER — CHLORTHALIDONE 25 MG PO TABS
25.0000 mg | ORAL_TABLET | Freq: Every day | ORAL | 0 refills | Status: DC
Start: 2022-12-05 — End: 2023-01-02

## 2022-12-05 MED ORDER — OLMESARTAN MEDOXOMIL 40 MG PO TABS
40.0000 mg | ORAL_TABLET | Freq: Every day | ORAL | 0 refills | Status: DC
Start: 2022-12-05 — End: 2023-01-02

## 2022-12-05 NOTE — Assessment & Plan Note (Addendum)
BP Readings from Last 3 Encounters:  12/05/22 (!) 153/84  06/18/21 140/73  03/23/21 (!) 153/97   HTN Chronic uncontrolled .  Start Benicar 40 mg daily, chlorthalidone 25 mg daily, continue amlodipine 10 mg daily Monitor blood pressure at home keep a log and bring to next visit in 4 weeks, bmp in 2 weeks.  Discussed DASH diet and dietary sodium restrictions Continue to increase dietary efforts and exercise.  CMP today

## 2022-12-05 NOTE — Assessment & Plan Note (Signed)
Continue Tylenol as needed

## 2022-12-05 NOTE — Progress Notes (Signed)
New Patient Office Visit  Subjective:  Patient ID: Kayla Moody, female    DOB: 1965-09-27  Age: 57 y.o. MRN: [id number]  CC:  Chief Complaint  Patient presents with   Establish Care    fasting    HPI Kayla Moody is a 57 y.o. female  has a past medical history of Chronic tension-type headache, intractable (10/22/2016), Essential hypertension (06/28/2016), H. pylori duodenitis (12/02/2020), Helicobacter pylori gastritis (11/30/2020), Hypertension, Prediabetes, and Symptomatic anemia (11/28/2020).  Patient presents to establish care for her chronic medical conditions Previous PCP Leilani Able MD , at [facility] , last visit was a couple of months ago.  Chronic pain.  Patient complains of chronic pain of bilateral shoulders, right wrist, bilateral knees.  Stated that she has had rotator cuff repair of both shoulders, she was told she would need bilateral knee replacement.  She was taking cortisone shots in the past that has stopped working.  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  She currently has aching pain rated 6/10 in both shoulders and right wrist.   Hypertension. Currently on olmesartan-hydrochlorothiazide 40-12.5 mg daily.  Stated that she has been following a low-salt heart healthy diet, does a lot of walking at work.  Patient reports home blood pressure readings of  150s to 160s over 90s.  Takes clonidine 0.2 mg twice daily as needed.  She denies chest pain, dizziness, edema  Mammogram ordered, patient declined flu vaccine.  She was encouraged to get the Tdap and shingles vaccine.       Past Medical History:  Diagnosis Date   Chronic tension-type headache, intractable 10/22/2016   Essential hypertension 06/28/2016   H. pylori duodenitis 12/02/2020   Helicobacter pylori gastritis 11/30/2020   Hypertension    Prediabetes    Symptomatic anemia 11/28/2020    Past Surgical History:  Procedure Laterality Date    BIOPSY  11/29/2020   Procedure: BIOPSY;  Surgeon: Iva Boop, MD;  Location: [facility] ENDOSCOPY;  Service: Endoscopy;;   CHOLECYSTECTOMY     ESOPHAGOGASTRODUODENOSCOPY (EGD) WITH PROPOFOL N/A 11/29/2020   Procedure: ESOPHAGOGASTRODUODENOSCOPY (EGD) WITH PROPOFOL;  Surgeon: Iva Boop, MD;  Location: [facility] ENDOSCOPY;  Service: Endoscopy;  Laterality: N/A;   HERNIA REPAIR      Family History  Problem Relation Age of Onset   Diabetes Mother    Hypertension Mother    Colon polyps Neg Hx    Colon cancer Neg Hx    Stomach cancer Neg Hx    Esophageal cancer Neg Hx    Rectal cancer Neg Hx     Social History   Socioeconomic History   Marital status: Widowed    Spouse name: Not on file   Number of children: 2   Years of education: Not on file   Highest education level: Not on file  Occupational History   Not on file  Tobacco Use   Smoking status: Never   Smokeless tobacco: Never  Vaping Use   Vaping status: Never Used  Substance and Sexual Activity   Alcohol use: No    Alcohol/week: 0.0 standard drinks of alcohol   Drug use: No   Sexual activity: Not Currently  Other Topics Concern   Not on file  Social History Narrative   Lives home alone.    Social Determinants of Health   Financial Resource Strain: Not on file  Food Insecurity: Not on file  Transportation Needs: Not on file  Physical Activity: Not  on file  Stress: Not on file  Social Connections: Not on file  Intimate Partner Violence: Low Risk  (08/08/2020)   Received from [facility]   Intimate Partner Violence    Insults You: Not on file    Threatens You: Not on file    Screams at You: Not on file    Physically Hurt: Not on file    Intimate Partner Violence Score: Not on file    ROS Review of Systems  Constitutional:  Negative for activity change, appetite change, chills, fatigue and fever.  HENT:  Negative for congestion, dental problem, ear discharge, ear pain, hearing loss, rhinorrhea, sinus  pressure, sinus pain, sneezing and sore throat.   Respiratory:  Negative for cough, chest tightness, shortness of breath and wheezing.   Cardiovascular:  Negative for chest pain, palpitations and leg swelling.  Gastrointestinal:  Negative for abdominal distention, abdominal pain, anal bleeding, blood in stool, constipation and diarrhea.  Genitourinary:  Negative for difficulty urinating, dysuria, flank pain, frequency, hematuria, menstrual problem, pelvic pain and vaginal bleeding.  Musculoskeletal:  Positive for arthralgias. Negative for back pain, gait problem, joint swelling and myalgias.  Skin:  Negative for color change, pallor, rash and wound.  Allergic/Immunologic: Negative for food allergies.  Neurological:  Negative for dizziness, tremors, facial asymmetry, weakness and headaches.  Hematological:  Negative for adenopathy. Does not bruise/bleed easily.  Psychiatric/Behavioral:  Negative for agitation, behavioral problems, confusion, decreased concentration, hallucinations, self-injury and suicidal ideas.     Objective:   Today's Vitals: BP (!) 153/84   Pulse 65   Wt 231 lb 6.4 oz (105 kg)   SpO2 100%   BMI 37.35 kg/m   Physical Exam Vitals and nursing note reviewed.  Constitutional:      General: She is not in acute distress.    Appearance: Normal appearance. She is obese. She is not ill-appearing, toxic-appearing or diaphoretic.  HENT:     Mouth/Throat:     Mouth: Mucous membranes are moist.     Pharynx: Oropharynx is clear. No oropharyngeal exudate or posterior oropharyngeal erythema.  Eyes:     General: No scleral icterus.       Right eye: No discharge.        Left eye: No discharge.     Extraocular Movements: Extraocular movements intact.     Conjunctiva/sclera: Conjunctivae normal.  Cardiovascular:     Rate and Rhythm: Normal rate and regular rhythm.     Pulses: Normal pulses.     Heart sounds: Murmur heard.     No friction rub. No gallop.  Pulmonary:      Effort: Pulmonary effort is normal. No respiratory distress.     Breath sounds: Normal breath sounds. No stridor. No wheezing, rhonchi or rales.  Chest:     Chest wall: No tenderness.  Abdominal:     General: There is no distension.     Palpations: Abdomen is soft.     Tenderness: There is no abdominal tenderness. There is no right CVA tenderness, left CVA tenderness or guarding.  Musculoskeletal:        General: Tenderness present. No swelling, deformity or signs of injury.     Right lower leg: No edema.     Left lower leg: No edema.     Comments: Tenderness on ROM of bilateral shoulders and right wrist. No redness or swelling noted  Skin:    General: Skin is warm and dry.     Capillary Refill: Capillary refill takes less than  2 seconds.     Coloration: Skin is not jaundiced or pale.     Findings: No bruising, erythema or lesion.  Neurological:     Mental Status: She is alert and oriented to person, place, and time.     Motor: No weakness.     Coordination: Coordination normal.     Gait: Gait normal.  Psychiatric:        Mood and Affect: Mood normal.        Behavior: Behavior normal.        Thought Content: Thought content normal.        Judgment: Judgment normal.     Assessment & Plan:   Problem List Items Addressed This Visit       Cardiovascular and Mediastinum   Essential hypertension - Primary    BP Readings from Last 3 Encounters:  12/05/22 (!) 153/84  06/18/21 140/73  03/23/21 (!) 153/97   HTN Chronic uncontrolled .  Start Benicar 40 mg daily, chlorthalidone 25 mg daily, continue amlodipine 10 mg daily Monitor blood pressure at home keep a log and bring to next visit in 4 weeks, bmp in 2 weeks.  Discussed DASH diet and dietary sodium restrictions Continue to increase dietary efforts and exercise.  CMP today        Relevant Medications   olmesartan (BENICAR) 40 MG tablet   chlorthalidone (HYGROTON) 25 MG tablet   Other Relevant Orders   CBC   CMP14+EGFR    Lipid panel   Basic Metabolic Panel     Musculoskeletal and Integument   Osteoarthritis of right wrist    Continue Tylenol as needed       Osteoarthritis of both knees    Patient will benefit from a total knee replacement Continue Tylenol as needed Patient does not want to see the orthopedics at this time         Other   Heart murmur    Echocardiogram done in 2022 showed aortic stenosis there was a recommendation for monitoring with future echocardiogram.  At next visit will discuss getting an echocardiogram Not seeing the cardiologist currently      Prediabetes    Lab Results  Component Value Date   HGBA1C 6.3 (A) 12/05/2022  Avoid sugar sweets soda, lose weight      Relevant Orders   POCT glycosylated hemoglobin (Hb A1C) (Completed)   Chronic pain of both shoulders    Continue Tylenol as needed She does not want to see the orthopedics at this time      Other Visit Diagnoses     Screening mammogram for breast cancer       Relevant Orders   MM Digital Screening   Screening for endocrine, nutritional, metabolic and immunity disorder       Relevant Orders   CBC   CMP14+EGFR   Lipid panel       Outpatient Encounter Medications as of 12/05/2022  Medication Sig   amLODipine (NORVASC) 10 MG tablet TAKE 1 TABLET BY MOUTH DAILY (Patient taking differently: Take 10 mg by mouth daily.)   chlorthalidone (HYGROTON) 25 MG tablet Take 1 tablet (25 mg total) by mouth daily.   cloNIDine (CATAPRES) 0.2 MG tablet Take 0.2 mg by mouth 2 (two) times daily as needed (high blood pressure).   olmesartan (BENICAR) 40 MG tablet Take 1 tablet (40 mg total) by mouth daily.   [DISCONTINUED] olmesartan-hydrochlorothiazide (BENICAR HCT) 40-12.5 MG tablet Take 1 tablet by mouth daily.   pantoprazole (PROTONIX) 40 MG  tablet Take 1 tablet (40 mg total) by mouth daily. (Patient not taking: Reported on 06/18/2021)   [DISCONTINUED] benzonatate (TESSALON) 100 MG capsule Take 1 capsule (100 mg  total) by mouth every 8 (eight) hours. (Patient not taking: Reported on 06/18/2021)   [DISCONTINUED] OVER THE COUNTER MEDICATION Apply 1 application. topically every 8 (eight) hours. Multivitamin Patch   No facility-administered encounter medications on file as of 12/05/2022.    Follow-up: Return in about 4 weeks (around 01/02/2023) for HTN.   Donell Beers, FNP

## 2022-12-05 NOTE — Assessment & Plan Note (Addendum)
Echocardiogram done in 2022 showed aortic stenosis there was a recommendation for monitoring with future echocardiogram.  At next visit will discuss getting an echocardiogram Not seeing the cardiologist currently

## 2022-12-05 NOTE — Assessment & Plan Note (Signed)
Lab Results  Component Value Date   HGBA1C 6.3 (A) 12/05/2022  Avoid sugar sweets soda, lose weight

## 2022-12-05 NOTE — Patient Instructions (Addendum)
1. Screening mammogram for breast cancer  - MM Digital Screening; Future  2. Essential hypertension  - olmesartan (BENICAR) 40 MG tablet; Take 1 tablet (40 mg total) by mouth daily.  Dispense: 90 tablet; Refill: 0 - chlorthalidone (HYGROTON) 25 MG tablet; Take 1 tablet (25 mg total) by mouth daily.  Dispense: 90 tablet; Refill: 0   The 2 medications above replaces your olmesartan-hydrochlorothiazide.   Please get your shingles vaccine, Tdap vaccine at the pharmacy   Call [phone] to schedule your mammogram    Around 3 times per week, check your blood pressure 2 times per day. once in the morning and once in the evening. The readings should be at least one minute apart. Write down these values and bring them to your next nurse visit/appointment.  When you check your BP, make sure you have been doing something calm/relaxing 5 minutes prior to checking. Both feet should be flat on the floor and you should be sitting. Use your left arm and make sure it is in a relaxed position (on a table), and that the cuff is at the approximate level/height of your heart.

## 2022-12-05 NOTE — Assessment & Plan Note (Signed)
Continue Tylenol as needed She does not want to see the orthopedics at this time

## 2022-12-05 NOTE — Assessment & Plan Note (Addendum)
Patient will benefit from a total knee replacement Continue Tylenol as needed Patient does not want to see the orthopedics at this time

## 2022-12-06 ENCOUNTER — Telehealth: Payer: Self-pay | Admitting: Nurse Practitioner

## 2022-12-06 LAB — CBC
Hematocrit: 43.9 % (ref 34.0–46.6)
Hemoglobin: 14.2 g/dL (ref 11.1–15.9)
MCH: 29.7 pg (ref 26.6–33.0)
MCHC: 32.3 g/dL (ref 31.5–35.7)
MCV: 92 fL (ref 79–97)
Platelets: 358 10*3/uL (ref 150–450)
RBC: 4.78 x10E6/uL (ref 3.77–5.28)
RDW: 12.1 % (ref 11.7–15.4)
WBC: 5.3 10*3/uL (ref 3.4–10.8)

## 2022-12-06 LAB — LIPID PANEL
Chol/HDL Ratio: 3.9 ratio (ref 0.0–4.4)
Cholesterol, Total: 217 mg/dL — ABNORMAL HIGH (ref 100–199)
HDL: 55 mg/dL (ref >39)
LDL Chol Calc (NIH): 151 mg/dL — ABNORMAL HIGH (ref 0–99)
Triglycerides: 65 mg/dL (ref 0–149)
VLDL Cholesterol Cal: 11 mg/dL (ref 5–40)

## 2022-12-06 LAB — CMP14+EGFR
ALT: 20 IU/L (ref 0–32)
AST: 17 IU/L (ref 0–40)
Albumin: 4.6 g/dL (ref 3.8–4.9)
Alkaline Phosphatase: 74 IU/L (ref 44–121)
BUN/Creatinine Ratio: 15 (ref 9–23)
BUN: 9 mg/dL (ref 6–24)
Bilirubin Total: 0.2 mg/dL (ref 0.0–1.2)
CO2: 24 mmol/L (ref 20–29)
Calcium: 9.6 mg/dL (ref 8.7–10.2)
Chloride: 103 mmol/L (ref 96–106)
Creatinine, Ser: 0.62 mg/dL (ref 0.57–1.00)
Globulin, Total: 2.7 g/dL (ref 1.5–4.5)
Glucose: 118 mg/dL — ABNORMAL HIGH (ref 70–99)
Potassium: 3.9 mmol/L (ref 3.5–5.2)
Sodium: 144 mmol/L (ref 134–144)
Total Protein: 7.3 g/dL (ref 6.0–8.5)
eGFR: 104 mL/min/{1.73_m2} (ref >59)

## 2022-12-06 NOTE — Telephone Encounter (Signed)
Pt LVM on nurse line 12/05/22 @ 10:35am requesting a call back from provider or nurse to discuss questions she has on medications prescribed during her appt on 12/05/22.  [phone]

## 2022-12-07 ENCOUNTER — Other Ambulatory Visit: Payer: Self-pay | Admitting: Nurse Practitioner

## 2022-12-13 ENCOUNTER — Telehealth: Payer: Self-pay

## 2022-12-13 NOTE — Telephone Encounter (Signed)
Pt was advise of labs . She stated with the new blood pressure medicine she felt like she was going to pass out.  Pt stated she had taken all three.  Please advise if she can take them at different times or if she can stop the new medicine. Kh

## 2022-12-14 NOTE — Telephone Encounter (Signed)
Pt was advised Kh 

## 2022-12-15 ENCOUNTER — Emergency Department (HOSPITAL_COMMUNITY): Payer: 59

## 2022-12-15 ENCOUNTER — Other Ambulatory Visit: Payer: Self-pay

## 2022-12-15 ENCOUNTER — Emergency Department (HOSPITAL_COMMUNITY)
Admission: EM | Admit: 2022-12-15 | Discharge: 2022-12-15 | Disposition: A | Discharge status: Home / Self Care | Payer: 59 | Attending: Emergency Medicine | Admitting: Emergency Medicine

## 2022-12-15 ENCOUNTER — Encounter (HOSPITAL_COMMUNITY): Payer: Self-pay

## 2022-12-15 DIAGNOSIS — R0789 Other chest pain: Secondary | ICD-10-CM | POA: Diagnosis present

## 2022-12-15 DIAGNOSIS — R079 Chest pain, unspecified: Secondary | ICD-10-CM

## 2022-12-15 LAB — BASIC METABOLIC PANEL
Anion gap: 9 (ref 5–15)
BUN: 14 mg/dL (ref 6–20)
CO2: 28 mmol/L (ref 22–32)
Calcium: 9.3 mg/dL (ref 8.9–10.3)
Chloride: 99 mmol/L (ref 98–111)
Creatinine, Ser: 0.56 mg/dL (ref 0.44–1.00)
GFR, Estimated: >60 mL/min (ref >60)
Glucose, Bld: 163 mg/dL — ABNORMAL HIGH (ref 70–99)
Potassium: 3.7 mmol/L (ref 3.5–5.1)
Sodium: 136 mmol/L (ref 135–145)

## 2022-12-15 LAB — D-DIMER, QUANTITATIVE: D-Dimer, Quant: 0.3 ug{FEU}/mL (ref 0.00–0.50)

## 2022-12-15 LAB — CBC
HCT: 41.4 % (ref 36.0–46.0)
Hemoglobin: 13.7 g/dL (ref 12.0–15.0)
MCH: 29.7 pg (ref 26.0–34.0)
MCHC: 33.1 g/dL (ref 30.0–36.0)
MCV: 89.8 fL (ref 80.0–100.0)
Platelets: 330 10*3/uL (ref 150–400)
RBC: 4.61 MIL/uL (ref 3.87–5.11)
RDW: 12.1 % (ref 11.5–15.5)
WBC: 6 10*3/uL (ref 4.0–10.5)
nRBC: 0 % (ref 0.0–0.2)

## 2022-12-15 LAB — TROPONIN I (HIGH SENSITIVITY)
Troponin I (High Sensitivity): 4 ng/L (ref <18)
Troponin I (High Sensitivity): 4 ng/L (ref <18)

## 2022-12-15 MED ORDER — ACETAMINOPHEN 325 MG PO TABS
650.0000 mg | ORAL_TABLET | Freq: Once | ORAL | Status: AC
  Administered 2022-12-15: 650 mg via ORAL
  Filled 2022-12-15: qty 2

## 2022-12-15 NOTE — ED Provider Notes (Signed)
[place] EMERGENCY DEPARTMENT AT [facility] Provider Note   CSN: [id number] Arrival date & time: 12/15/22  9562     History  Chief Complaint  Patient presents with   Chest Pain    Kayla Moody is a 57 y.o. female.  Patient here with left-sided chest pain.  Recurrent for the last 2 days.  Does do manual labor for work.  Denies any cough sputum production.  Pain seems to be worse with movement or deep breathing.  No history of blood clots.  No recent surgery or travel.  History of high blood pressure..  Patient denies any weakness numbness tingling.  Nothing makes it better.  Pain fairly constant.  Denies any black or bloody stools.  Denies any abdominal pain.  The history is provided by the patient.       Home Medications Prior to Admission medications   Medication Sig Start Date End Date Taking? Authorizing Provider  amLODipine (NORVASC) 10 MG tablet Take 1 tablet (10 mg total) by mouth daily. 12/05/22   Donell Beers, FNP  chlorthalidone (HYGROTON) 25 MG tablet Take 1 tablet (25 mg total) by mouth daily. 12/05/22   Donell Beers, FNP  cloNIDine (CATAPRES) 0.2 MG tablet Take 0.2 mg by mouth 2 (two) times daily as needed (high blood pressure). 06/16/21   [provider]  olmesartan (BENICAR) 40 MG tablet Take 1 tablet (40 mg total) by mouth daily. 12/05/22   Paseda, Baird Kay, FNP  pantoprazole (PROTONIX) 40 MG tablet Take 1 tablet (40 mg total) by mouth daily. Patient not taking: Reported on 06/18/2021 01/03/21   Sammuel Cooper, PA-C      Allergies    Penicillins    Review of Systems   Review of Systems  Cardiovascular:  Positive for chest pain.    Physical Exam Updated Vital Signs BP 130/83   Pulse 68   Temp 98.3 F (36.8 C) (Oral)   Resp 16   Ht 5\' 5"  (1.651 m)   Wt 99.8 kg   SpO2 99%   BMI 36.61 kg/m  Physical Exam Vitals and nursing note reviewed.  Constitutional:      General: She is not in acute distress.     Appearance: She is well-developed. She is not ill-appearing.  HENT:     Head: Normocephalic and atraumatic.  Eyes:     Extraocular Movements: Extraocular movements intact.     Conjunctiva/sclera: Conjunctivae normal.     Pupils: Pupils are equal, round, and reactive to light.  Cardiovascular:     Rate and Rhythm: Normal rate and regular rhythm.     Pulses:          Radial pulses are 2+ on the right side and 2+ on the left side.     Heart sounds: Normal heart sounds. No murmur heard. Pulmonary:     Effort: Pulmonary effort is normal. No respiratory distress.     Breath sounds: Normal breath sounds. No decreased breath sounds, wheezing, rhonchi or rales.  Chest:     Chest wall: Tenderness present.  Abdominal:     Palpations: Abdomen is soft.     Tenderness: There is no abdominal tenderness.  Musculoskeletal:        General: No swelling. Normal range of motion.     Cervical back: Normal range of motion and neck supple.     Right lower leg: No edema.     Left lower leg: No edema.  Skin:    General: Skin  is warm and dry.     Capillary Refill: Capillary refill takes less than 2 seconds.  Neurological:     General: No focal deficit present.     Mental Status: She is alert.  Psychiatric:        Mood and Affect: Mood normal.     ED Results / Procedures / Treatments   Labs (all labs ordered are listed, but only abnormal results are displayed) Labs Reviewed  BASIC METABOLIC PANEL - Abnormal; Notable for the following components:      Result Value   Glucose, Bld 163 (*)    All other components within normal limits  CBC  D-DIMER, QUANTITATIVE  TROPONIN I (HIGH SENSITIVITY)  TROPONIN I (HIGH SENSITIVITY)    EKG EKG Interpretation Date/Time:  Saturday December 15 2022 09:43:36 EDT Ventricular Rate:  63 PR Interval:  180 QRS Duration:  81 QT Interval:  420 QTC Calculation: 430 R Axis:   46  Text Interpretation: Sinus rhythm Low voltage, precordial leads Confirmed by  Virgina Norfolk [phone]) on 12/15/2022 9:49:46 AM  Radiology DG Chest 2 View  Result Date: 12/15/2022 CLINICAL DATA:  Chest pain for 3 days. EXAM: CHEST - 2 VIEW COMPARISON:  07/07/2021 FINDINGS: The heart size and mediastinal contours are within normal limits. Both lungs are clear. The visualized skeletal structures are unremarkable. IMPRESSION: No active cardiopulmonary disease. Electronically Signed   By: Danae Orleans M.D.   On: 12/15/2022 09:07    Procedures Procedures    Medications Ordered in ED Medications  acetaminophen (TYLENOL) tablet 650 mg (650 mg Oral Given 12/15/22 1014)    ED Course/ Medical Decision Making/ A&P                                 Medical Decision Making Amount and/or Complexity of Data Reviewed Labs: ordered. Radiology: ordered.  Risk OTC drugs.   Kayla Moody is here with chest pain for the last few days.  History of hypertension.  Vital signs overall unremarkable.  Somewhat reproducible pain on exam.  She does have some chronic issues with both shoulders.  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  Overall differential diagnosis ACS versus PE versus MSK versus reflux, less likely infectious process.  Have no concern for dissection.  Seems unlikely to be other acute process.  Will check CBC BMP troponin D-dimer chest x-ray EKG.  EKG per my review interpretation shows sinus rhythm.  No ischemic changes.  Chest x-ray per my review interpretation shows no evidence of pneumonia or pneumothorax.  Remaining lab work is pending.  Tylenol provided.  Per my review and interpretation of labs no significant anemia electrolyte abnormality or kidney injury or leukocytosis.  D-dimer normal.  Troponin normal.  Per my review and interpretation of chest x-ray there is no evidence of pneumonia.  Overall troponins normal.  Atypical story for ACS.  No concern for PE or dissection or other acute process.  I do think that this is musculoskeletal.   Recommend lidocaine patches and Tylenol.  Discharged in good condition.  Recommend follow-up with primary care doctor.  Understands return precautions.  This chart was dictated using voice recognition software.  Despite best efforts to proofread,  errors can occur which can change the documentation meaning.         Final Clinical Impression(s) / ED Diagnoses Final diagnoses:  Nonspecific chest pain    Rx / DC  Orders ED Discharge Orders     None         Virgina Norfolk, DO 12/15/22 1235

## 2022-12-15 NOTE — ED Triage Notes (Signed)
Pt complaining of chest pain/discomfort that has been ongoing for apprx 3 days. Denies any n/v/d. Describes it as pain/pressure, and difficult to take a breath.

## 2022-12-15 NOTE — Discharge Instructions (Signed)
I do suspect that your pain is muscular in nature.  However please return if symptoms worsen or become more frequent.  Recommend taking Tylenol 650 mg every 6 hours as needed for pain.  Consider buying over-the-counter lidocaine patches as well.  Follow-up with your primary care doctor.

## 2022-12-19 ENCOUNTER — Other Ambulatory Visit: Payer: 59

## 2022-12-19 DIAGNOSIS — I10 Essential (primary) hypertension: Secondary | ICD-10-CM

## 2022-12-20 LAB — BASIC METABOLIC PANEL
BUN/Creatinine Ratio: 23 (ref 9–23)
BUN: 16 mg/dL (ref 6–24)
CO2: 23 mmol/L (ref 20–29)
Calcium: 10 mg/dL (ref 8.7–10.2)
Chloride: 101 mmol/L (ref 96–106)
Creatinine, Ser: 0.71 mg/dL (ref 0.57–1.00)
Glucose: 114 mg/dL — ABNORMAL HIGH (ref 70–99)
Potassium: 3.6 mmol/L (ref 3.5–5.2)
Sodium: 142 mmol/L (ref 134–144)
eGFR: 99 mL/min/{1.73_m2} (ref >59)

## 2023-01-02 ENCOUNTER — Ambulatory Visit (INDEPENDENT_AMBULATORY_CARE_PROVIDER_SITE_OTHER): Payer: 59 | Admitting: Nurse Practitioner

## 2023-01-02 ENCOUNTER — Encounter: Payer: Self-pay | Admitting: Nurse Practitioner

## 2023-01-02 VITALS — BP 132/75 | HR 62 | Temp 97.0°F | Wt 229.0 lb

## 2023-01-02 DIAGNOSIS — R079 Chest pain, unspecified: Secondary | ICD-10-CM | POA: Insufficient documentation

## 2023-01-02 DIAGNOSIS — R7303 Prediabetes: Secondary | ICD-10-CM

## 2023-01-02 DIAGNOSIS — E66812 Obesity, class 2: Secondary | ICD-10-CM

## 2023-01-02 DIAGNOSIS — I10 Essential (primary) hypertension: Secondary | ICD-10-CM | POA: Diagnosis not present

## 2023-01-02 DIAGNOSIS — Z8639 Personal history of other endocrine, nutritional and metabolic disease: Secondary | ICD-10-CM | POA: Diagnosis not present

## 2023-01-02 DIAGNOSIS — Z6838 Body mass index (BMI) 38.0-38.9, adult: Secondary | ICD-10-CM

## 2023-01-02 HISTORY — DX: Obesity, class 2: E66.812

## 2023-01-02 HISTORY — DX: Personal history of other endocrine, nutritional and metabolic disease: Z86.39

## 2023-01-02 MED ORDER — OLMESARTAN MEDOXOMIL 40 MG PO TABS
40.0000 mg | ORAL_TABLET | Freq: Every day | ORAL | 0 refills | Status: DC
Start: 2023-01-02 — End: 2023-09-04

## 2023-01-02 MED ORDER — CHLORTHALIDONE 25 MG PO TABS
25.0000 mg | ORAL_TABLET | Freq: Every day | ORAL | 0 refills | Status: DC
Start: 2023-01-02 — End: 2023-09-05

## 2023-01-02 NOTE — Patient Instructions (Signed)
Please consider getting Shingrix, Influenza  and Tdap vaccine at local pharmacy.    Goal for fasting blood sugar ranges from 80 to 120 and 2 hours after any meal or at bedtime should be between 130 to 170.     Essential hypertension  - chlorthalidone (HYGROTON) 25 MG tablet; Take 1 tablet (25 mg total) by mouth daily.  Dispense: 90 tablet; Refill: 0 - olmesartan (BENICAR) 40 MG tablet; Take 1 tablet (40 mg total) by mouth daily.  Dispense: 90 tablet; Refill: 0    It is important that you exercise regularly at least 30 minutes 5 times a week as tolerated  Think about what you will eat, plan ahead. Choose " clean, green, fresh or frozen" over canned, processed or packaged foods which are more sugary, salty and fatty. 70 to 75% of food eaten should be vegetables and fruit. Three meals at set times with snacks allowed between meals, but they must be fruit or vegetables. Aim to eat over a 12 hour period , example 7 am to 7 pm, and STOP after  your last meal of the day. Drink water,generally about 64 ounces per day, no other drink is as healthy. Fruit juice is best enjoyed in a healthy way, by EATING the fruit.  Thanks for choosing [facility] we consider it a privelige to serve you.

## 2023-01-02 NOTE — Assessment & Plan Note (Signed)
Lab Results  Component Value Date   HGBA1C 6.3 (A) 12/05/2022  Patient counseled on low-carb modified diet Encouraged to engage in regular moderate exercises at least  150 minutes weekly as tolerated

## 2023-01-02 NOTE — Progress Notes (Signed)
Established Patient Office Visit  Subjective:  Patient ID: Kayla Moody, female    DOB: 18-Feb-1966  Age: 57 y.o. MRN: [id number]  CC:  Chief Complaint  Patient presents with   Hypertension    HPI Kayla Moody is a 57 y.o. female  has a past medical history of Chronic tension-type headache, intractable (10/22/2016), Class 2 severe obesity due to excess calories with serious comorbidity and body mass index (BMI) of 38.0 to 38.9 in adult [facility]) (01/02/2023), Essential hypertension (06/28/2016), H. pylori duodenitis (12/02/2020), Helicobacter pylori gastritis (11/30/2020), History of hypercholesterolemia (01/02/2023), Hypertension, Osteoarthritis of both knees (12/05/2022), Osteoarthritis of right wrist (12/05/2022), Prediabetes, and Symptomatic anemia (11/28/2020).  Patient seen for follow-up for hypertension  Hypertension.  Currently on amlodipine 10 mg daily, chlorthalidone 25 mg daily, olmesartan 40 mg daily.  Patient stated that she has been trying to eat better and also does some exercises at home.  She checks her blood pressure on some days, last reading was 131/82.  She denies chest pain, shortness of breath, edema  Patient denies any adverse reactions to current medications    Patient encouraged to consider getting your flu vaccine, Tdap vaccine and shingles vaccine She was also encouraged to get her mammogram done.  Past Medical History:  Diagnosis Date   Chronic tension-type headache, intractable 10/22/2016   Class 2 severe obesity due to excess calories with serious comorbidity and body mass index (BMI) of 38.0 to 38.9 in adult [facility]) 01/02/2023   Essential hypertension 06/28/2016   H. pylori duodenitis 12/02/2020   Helicobacter pylori gastritis 11/30/2020   History of hypercholesterolemia 01/02/2023   Hypertension    Osteoarthritis of both knees 12/05/2022   Osteoarthritis of right wrist 12/05/2022   Prediabetes    Symptomatic anemia 11/28/2020    Past Surgical  History:  Procedure Laterality Date   BIOPSY  11/29/2020   Procedure: BIOPSY;  Surgeon: Iva Boop, MD;  Location: [facility] [place] ENDOSCOPY;  Service: Endoscopy;;   CHOLECYSTECTOMY     ESOPHAGOGASTRODUODENOSCOPY (EGD) WITH PROPOFOL N/A 11/29/2020   Procedure: ESOPHAGOGASTRODUODENOSCOPY (EGD) WITH PROPOFOL;  Surgeon: Iva Boop, MD;  Location: [facility] ENDOSCOPY;  Service: Endoscopy;  Laterality: N/A;   HERNIA REPAIR      Family History  Problem Relation Age of Onset   Diabetes Mother    Hypertension Mother    Colon polyps Neg Hx    Colon cancer Neg Hx    Stomach cancer Neg Hx    Esophageal cancer Neg Hx    Rectal cancer Neg Hx     Social History   Socioeconomic History   Marital status: Widowed    Spouse name: Not on file   Number of children: 2   Years of education: Not on file   Highest education level: Not on file  Occupational History   Not on file  Tobacco Use   Smoking status: Never   Smokeless tobacco: Never  Vaping Use   Vaping status: Never Used  Substance and Sexual Activity   Alcohol use: No    Alcohol/week: 0.0 standard drinks of alcohol   Drug use: No   Sexual activity: Not Currently  Other Topics Concern   Not on file  Social History Narrative   Lives home alone.    Social Determinants of Health   Financial Resource Strain: Not on file  Food Insecurity: Not on file  Transportation Needs: Not on file  Physical Activity: Not on file  Stress: Not on file  Social Connections: Not on file  Intimate Partner Violence: Low Risk  (08/08/2020)   Received from [facility]   Intimate Partner Violence    Insults You: Not on file    Threatens You: Not on file    Screams at You: Not on file    Physically Hurt: Not on file    Intimate Partner Violence Score: Not on file    Outpatient Medications Prior to Visit  Medication Sig Dispense Refill   amLODipine (NORVASC) 10 MG tablet Take 1 tablet (10 mg total) by mouth daily. 90 tablet 1   chlorthalidone (HYGROTON)  25 MG tablet Take 1 tablet (25 mg total) by mouth daily. 90 tablet 0   olmesartan (BENICAR) 40 MG tablet Take 1 tablet (40 mg total) by mouth daily. 90 tablet 0   cloNIDine (CATAPRES) 0.2 MG tablet Take 0.2 mg by mouth 2 (two) times daily as needed (high blood pressure). (Patient not taking: Reported on 01/02/2023)     pantoprazole (PROTONIX) 40 MG tablet Take 1 tablet (40 mg total) by mouth daily. (Patient not taking: Reported on 06/18/2021) 30 tablet 0   No facility-administered medications prior to visit.    Allergies  Allergen Reactions   Penicillins Anaphylaxis and Other (See Comments)    Family history of severe allergic reactions to this, so the patient does not attempt it. Tolerated amoxicillin 11/30/2020, no issues noted.     ROS Review of Systems  Constitutional:  Negative for appetite change, chills, fatigue and fever.  HENT:  Negative for congestion, postnasal drip, rhinorrhea and sneezing.   Respiratory:  Negative for cough, shortness of breath and wheezing.   Cardiovascular:  Negative for chest pain, palpitations and leg swelling.  Gastrointestinal:  Negative for abdominal pain, constipation, nausea and vomiting.  Genitourinary:  Negative for difficulty urinating, dysuria, flank pain and frequency.  Musculoskeletal:  Negative for arthralgias, back pain, joint swelling and myalgias.  Skin:  Negative for color change, pallor, rash and wound.  Neurological:  Negative for dizziness, facial asymmetry, weakness, numbness and headaches.  Psychiatric/Behavioral:  Negative for behavioral problems, confusion, self-injury and suicidal ideas.       Objective:    Physical Exam Vitals and nursing note reviewed.  Constitutional:      General: She is not in acute distress.    Appearance: Normal appearance. She is obese. She is not ill-appearing, toxic-appearing or diaphoretic.  HENT:     Mouth/Throat:     Mouth: Mucous membranes are moist.     Pharynx: Oropharynx is clear. No  oropharyngeal exudate or posterior oropharyngeal erythema.  Eyes:     General: No scleral icterus.       Right eye: No discharge.        Left eye: No discharge.     Extraocular Movements: Extraocular movements intact.     Conjunctiva/sclera: Conjunctivae normal.  Cardiovascular:     Rate and Rhythm: Normal rate and regular rhythm.     Pulses: Normal pulses.     Heart sounds: Normal heart sounds. No murmur heard.    No friction rub. No gallop.  Pulmonary:     Effort: Pulmonary effort is normal. No respiratory distress.     Breath sounds: Normal breath sounds. No stridor. No wheezing, rhonchi or rales.  Chest:     Chest wall: No tenderness.  Abdominal:     General: There is no distension.     Palpations: Abdomen is soft.     Tenderness: There is no abdominal tenderness. There is no right CVA tenderness, left  CVA tenderness or guarding.  Musculoskeletal:        General: No swelling, tenderness, deformity or signs of injury.     Right lower leg: No edema.     Left lower leg: No edema.  Skin:    General: Skin is warm and dry.     Capillary Refill: Capillary refill takes less than 2 seconds.     Coloration: Skin is not jaundiced or pale.     Findings: No bruising, erythema or lesion.  Neurological:     Mental Status: She is alert and oriented to person, place, and time.     Motor: No weakness.     Coordination: Coordination normal.     Gait: Gait normal.  Psychiatric:        Mood and Affect: Mood normal.        Behavior: Behavior normal.        Thought Content: Thought content normal.        Judgment: Judgment normal.     BP 132/75   Pulse 62   Temp (!) 97 F (36.1 C)   Wt 229 lb (103.9 kg)   SpO2 100%   BMI 38.11 kg/m  Wt Readings from Last 3 Encounters:  01/02/23 229 lb (103.9 kg)  12/15/22 220 lb (99.8 kg)  12/05/22 231 lb 6.4 oz (105 kg)    No results found for: "TSH" Lab Results  Component Value Date   WBC 6.0 12/15/2022   HGB 13.7 12/15/2022   HCT 41.4  12/15/2022   MCV 89.8 12/15/2022   PLT 330 12/15/2022   Lab Results  Component Value Date   NA 142 12/19/2022   K 3.6 12/19/2022   CO2 23 12/19/2022   GLUCOSE 114 (H) 12/19/2022   BUN 16 12/19/2022   CREATININE 0.71 12/19/2022   BILITOT 0.2 12/05/2022   ALKPHOS 74 12/05/2022   AST 17 12/05/2022   ALT 20 12/05/2022   PROT 7.3 12/05/2022   ALBUMIN 4.6 12/05/2022   CALCIUM 10.0 12/19/2022   ANIONGAP 9 12/15/2022   EGFR 99 12/19/2022   Lab Results  Component Value Date   CHOL 217 (H) 12/05/2022   Lab Results  Component Value Date   HDL 55 12/05/2022   Lab Results  Component Value Date   LDLCALC 151 (H) 12/05/2022   Lab Results  Component Value Date   TRIG 65 12/05/2022   Lab Results  Component Value Date   CHOLHDL 3.9 12/05/2022   Lab Results  Component Value Date   HGBA1C 6.3 (A) 12/05/2022      Assessment & Plan:   Problem List Items Addressed This Visit       Cardiovascular and Mediastinum   Essential hypertension    BP Readings from Last 3 Encounters:  01/02/23 132/75  12/15/22 130/83  12/05/22 (!) 153/84   HTN Controlled on Hygroton 25 mg daily, olmesartan 40 mg daily, amlodipine 10 mg daily. Continue current medications. No changes in management. Discussed DASH diet and dietary sodium restrictions Continue to increase dietary efforts and exercise.  Follow-up in 4 months      Relevant Medications   chlorthalidone (HYGROTON) 25 MG tablet   olmesartan (BENICAR) 40 MG tablet     Other   Prediabetes - Primary    Lab Results  Component Value Date   HGBA1C 6.3 (A) 12/05/2022  Patient counseled on low-carb modified diet Encouraged to engage in regular moderate exercises at least  150 minutes weekly as tolerated  History of hypercholesterolemia    Lab Results  Component Value Date   CHOL 217 (H) 12/05/2022   HDL 55 12/05/2022   LDLCALC 151 (H) 12/05/2022   TRIG 65 12/05/2022   CHOLHDL 3.9 12/05/2022  Avoid fatty fried foods Lose  weight      Class 2 severe obesity due to excess calories with serious comorbidity and body mass index (BMI) of 38.0 to 38.9 in adult (HCC)    Wt Readings from Last 3 Encounters:  01/02/23 229 lb (103.9 kg)  12/15/22 220 lb (99.8 kg)  12/05/22 231 lb 6.4 oz (105 kg)   Body mass index is 38.11 kg/m.  Patient counseled on low-carb modified diet Encouraged to engage in regular moderate exercises at least 150 minutes weekly Not interested in referral to medical weight management clinic      Nonspecific chest pain    Now resolved       Meds ordered this encounter  Medications   chlorthalidone (HYGROTON) 25 MG tablet    Sig: Take 1 tablet (25 mg total) by mouth daily.    Dispense:  90 tablet    Refill:  0   olmesartan (BENICAR) 40 MG tablet    Sig: Take 1 tablet (40 mg total) by mouth daily.    Dispense:  90 tablet    Refill:  0    Follow-up: Return in about 4 months (around 05/05/2023) for HTN.    Donell Beers, FNP

## 2023-01-02 NOTE — Assessment & Plan Note (Signed)
Lab Results  Component Value Date   CHOL 217 (H) 12/05/2022   HDL 55 12/05/2022   LDLCALC 151 (H) 12/05/2022   TRIG 65 12/05/2022   CHOLHDL 3.9 12/05/2022  Avoid fatty fried foods Lose weight

## 2023-01-02 NOTE — Assessment & Plan Note (Signed)
Wt Readings from Last 3 Encounters:  01/02/23 229 lb (103.9 kg)  12/15/22 220 lb (99.8 kg)  12/05/22 231 lb 6.4 oz (105 kg)   Body mass index is 38.11 kg/m.  Patient counseled on low-carb modified diet Encouraged to engage in regular moderate exercises at least 150 minutes weekly Not interested in referral to medical weight management clinic

## 2023-01-02 NOTE — Assessment & Plan Note (Signed)
BP Readings from Last 3 Encounters:  01/02/23 132/75  12/15/22 130/83  12/05/22 (!) 153/84   HTN Controlled on Hygroton 25 mg daily, olmesartan 40 mg daily, amlodipine 10 mg daily. Continue current medications. No changes in management. Discussed DASH diet and dietary sodium restrictions Continue to increase dietary efforts and exercise.  Follow-up in 4 months

## 2023-01-02 NOTE — Assessment & Plan Note (Addendum)
She was at the emergency room on 12/15/2022 for complaints of chest pain.  EKG, was normal, chest x-ray was normal.  Her pain was thought to be of musculoskeletal origin.  Patient currently denies pain.  Stated that she was out of work for 3 days.  She has reduced her hours at her job, got a second job that does not requires lifting.

## 2023-01-15 ENCOUNTER — Other Ambulatory Visit: Payer: Self-pay | Admitting: Nurse Practitioner

## 2023-01-15 DIAGNOSIS — Z1231 Encounter for screening mammogram for malignant neoplasm of breast: Secondary | ICD-10-CM

## 2023-01-17 ENCOUNTER — Other Ambulatory Visit: Payer: Self-pay | Admitting: Nurse Practitioner

## 2023-01-28 ENCOUNTER — Ambulatory Visit: Payer: Self-pay | Admitting: Podiatry

## 2023-01-29 ENCOUNTER — Encounter: Payer: Self-pay | Admitting: Nurse Practitioner

## 2023-01-29 ENCOUNTER — Ambulatory Visit (INDEPENDENT_AMBULATORY_CARE_PROVIDER_SITE_OTHER): Payer: 59 | Admitting: Nurse Practitioner

## 2023-01-29 VITALS — BP 112/69 | HR 69 | Temp 97.1°F | Wt 229.0 lb

## 2023-01-29 DIAGNOSIS — K219 Gastro-esophageal reflux disease without esophagitis: Secondary | ICD-10-CM | POA: Insufficient documentation

## 2023-01-29 DIAGNOSIS — M7989 Other specified soft tissue disorders: Secondary | ICD-10-CM | POA: Insufficient documentation

## 2023-01-29 DIAGNOSIS — Z09 Encounter for follow-up examination after completed treatment for conditions other than malignant neoplasm: Secondary | ICD-10-CM | POA: Diagnosis not present

## 2023-01-29 DIAGNOSIS — M79662 Pain in left lower leg: Secondary | ICD-10-CM | POA: Diagnosis not present

## 2023-01-29 MED ORDER — PANTOPRAZOLE SODIUM 40 MG PO TBEC: 40.0000 mg | DELAYED_RELEASE_TABLET | Freq: Every day | ORAL | 3 refills | Status: DC

## 2023-01-29 NOTE — Assessment & Plan Note (Signed)
Hospital chart reviewed, including discharge summary Medications reconciled and reviewed with the patient in detail 

## 2023-01-29 NOTE — Assessment & Plan Note (Signed)
Pain is much better swelling has resolved Continue diclofenac 75 mg twice daily as ordered Encouraged to take medication with food and also take with pantoprazole 40 mg daily due to a history of GI hemorrhage Application of heat or ice encouraged

## 2023-01-29 NOTE — Progress Notes (Signed)
Established Patient Office Visit  Subjective:  Patient ID: Kayla Moody, female    DOB: February 21, 1966  Age: 57 y.o. MRN: [id number]  CC:  Chief Complaint  Patient presents with   Follow-up    HPI Kayla Moody is a 57 y.o. female  has a past medical history of Chronic tension-type headache, intractable (10/22/2016), Class 2 severe obesity due to excess calories with serious comorbidity and body mass index (BMI) of 38.0 to 38.9 in adult [facility]) (01/02/2023), Essential hypertension (06/28/2016), H. pylori duodenitis (12/02/2020), Helicobacter pylori gastritis (11/30/2020), History of hypercholesterolemia (01/02/2023), Hypertension, Osteoarthritis of both knees (12/05/2022), Osteoarthritis of right wrist (12/05/2022), Prediabetes, and Symptomatic anemia (11/28/2020).   Patient presents for hospitalization follow-up.  Patient was at the urgent care on 01/25/2023 for complaints of  pain and swelling  of left lower extremity for 2 weeks.  Doppler ultrasound was negative for DVT.  She was prescribed diclofenac 75 mg twice daily for 15 days was ordered.  Patient stated that she could barely walk prior to her recent ED visit.  States that her pain is much better today.  She denies fever, chills, abdominal pain, nausea vomiting.  She has history of bilateral knee arthritis, stated that she has had cortisone shots in the past, stated orthopedics feels that she needs a knee replacement surgery but she is not ready for surgery      Past Medical History:  Diagnosis Date   Chronic tension-type headache, intractable 10/22/2016   Class 2 severe obesity due to excess calories with serious comorbidity and body mass index (BMI) of 38.0 to 38.9 in adult [facility]) 01/02/2023   Essential hypertension 06/28/2016   H. pylori duodenitis 12/02/2020   Helicobacter pylori gastritis 11/30/2020   History of hypercholesterolemia 01/02/2023   Hypertension    Osteoarthritis of both knees 12/05/2022   Osteoarthritis of right  wrist 12/05/2022   Prediabetes    Symptomatic anemia 11/28/2020    Past Surgical History:  Procedure Laterality Date   BIOPSY  11/29/2020   Procedure: BIOPSY;  Surgeon: Iva Boop, MD;  Location: [facility] ENDOSCOPY;  Service: Endoscopy;;   CHOLECYSTECTOMY     ESOPHAGOGASTRODUODENOSCOPY (EGD) WITH PROPOFOL N/A 11/29/2020   Procedure: ESOPHAGOGASTRODUODENOSCOPY (EGD) WITH PROPOFOL;  Surgeon: Iva Boop, MD;  Location: [facility] ENDOSCOPY;  Service: Endoscopy;  Laterality: N/A;   HERNIA REPAIR      Family History  Problem Relation Age of Onset   Diabetes Mother    Hypertension Mother    Colon polyps Neg Hx    Colon cancer Neg Hx    Stomach cancer Neg Hx    Esophageal cancer Neg Hx    Rectal cancer Neg Hx     Social History   Socioeconomic History   Marital status: Widowed    Spouse name: Not on file   Number of children: 2   Years of education: Not on file   Highest education level: Associate degree: occupational, Scientist, product/process development, or vocational program  Occupational History   Not on file  Tobacco Use   Smoking status: Never   Smokeless tobacco: Never  Vaping Use   Vaping status: Never Used  Substance and Sexual Activity   Alcohol use: No    Alcohol/week: 0.0 standard drinks of alcohol   Drug use: No   Sexual activity: Not Currently  Other Topics Concern   Not on file  Social History Narrative   Lives home alone.    Social Determinants of Health   Financial Resource Strain: Medium Risk (01/28/2023)  Overall Financial Resource Strain (CARDIA)    Difficulty of Paying Living Expenses: Somewhat hard  Food Insecurity: Food Insecurity Present (01/28/2023)   Hunger Vital Sign    Worried About Running Out of Food in the Last Year: Sometimes true    Ran Out of Food in the Last Year: Sometimes true  Transportation Needs: No Transportation Needs (01/28/2023)   PRAPARE - Administrator, Civil Service (Medical): No    Lack of Transportation (Non-Medical): No  Physical  Activity: Unknown (01/28/2023)   Exercise Vital Sign    Days of Exercise per Week: 1 day    Minutes of Exercise per Session: Not on file  Stress: Stress Concern Present (01/28/2023)   [employer] of Occupational Health - Occupational Stress Questionnaire    Feeling of Stress : Very much  Social Connections: Socially Isolated (01/28/2023)   Social Connection and Isolation Panel [NHANES]    Frequency of Communication with Friends and Family: Once a week    Frequency of Social Gatherings with Friends and Family: Never    Attends Religious Services: Never    Database administrator or Organizations: No    Attends Engineer, structural: Not on file    Marital Status: Widowed  Intimate Partner Violence: Low Risk  (08/08/2020)   [facility]   Intimate Partner Violence    Insults You: Not on file    Threatens You: Not on file    Screams at You: Not on file    Physically Hurt: Not on file    Intimate Partner Violence Score: Not on file    Outpatient Medications Prior to Visit  Medication Sig Dispense Refill   amLODipine (NORVASC) 10 MG tablet Take 1 tablet (10 mg total) by mouth daily. 90 tablet 1   chlorthalidone (HYGROTON) 25 MG tablet Take 1 tablet (25 mg total) by mouth daily. 90 tablet 0   metFORMIN (GLUCOPHAGE) 500 MG tablet TAKE 1 TABLET BY MOUTH EVERY DAY 90 tablet 1   olmesartan (BENICAR) 40 MG tablet Take 1 tablet (40 mg total) by mouth daily. 90 tablet 0   pantoprazole (PROTONIX) 40 MG tablet Take 1 tablet (40 mg total) by mouth daily. 30 tablet 0   cloNIDine (CATAPRES) 0.2 MG tablet Take 0.2 mg by mouth 2 (two) times daily as needed (high blood pressure). (Patient not taking: Reported on 01/02/2023)     No facility-administered medications prior to visit.    Allergies  Allergen Reactions   Penicillins Anaphylaxis and Other (See Comments)    Family history of severe allergic reactions to this, so the patient does not attempt it. Tolerated  amoxicillin 11/30/2020, no issues noted.     ROS Review of Systems  Constitutional:  Negative for appetite change, chills, fatigue and fever.  HENT:  Negative for congestion, postnasal drip, rhinorrhea and sneezing.   Respiratory:  Negative for cough, shortness of breath and wheezing.   Cardiovascular:  Negative for chest pain, palpitations and leg swelling.  Gastrointestinal:  Negative for abdominal pain, constipation, nausea and vomiting.  Genitourinary:  Negative for difficulty urinating, dysuria, flank pain and frequency.  Musculoskeletal:  Positive for arthralgias. Negative for back pain, joint swelling and myalgias.  Skin:  Negative for color change, pallor, rash and wound.  Neurological:  Negative for dizziness, facial asymmetry, weakness, numbness and headaches.  Psychiatric/Behavioral:  Negative for behavioral problems, confusion, self-injury and suicidal ideas.       Objective:    Physical Exam Vitals and nursing  note reviewed.  Constitutional:      General: She is not in acute distress.    Appearance: Normal appearance. She is obese. She is not ill-appearing, toxic-appearing or diaphoretic.  HENT:     Mouth/Throat:     Mouth: Mucous membranes are moist.     Pharynx: Oropharynx is clear. No oropharyngeal exudate or posterior oropharyngeal erythema.  Eyes:     General: No scleral icterus.       Right eye: No discharge.        Left eye: No discharge.     Extraocular Movements: Extraocular movements intact.     Conjunctiva/sclera: Conjunctivae normal.  Cardiovascular:     Rate and Rhythm: Normal rate and regular rhythm.     Pulses: Normal pulses.     Heart sounds: Normal heart sounds. No murmur heard.    No friction rub. No gallop.  Pulmonary:     Effort: Pulmonary effort is normal. No respiratory distress.     Breath sounds: Normal breath sounds. No stridor. No wheezing, rhonchi or rales.  Chest:     Chest wall: No tenderness.  Abdominal:     General: There is no  distension.     Palpations: Abdomen is soft.     Tenderness: There is no abdominal tenderness. There is no right CVA tenderness, left CVA tenderness or guarding.  Musculoskeletal:        General: No swelling, tenderness, deformity or signs of injury.     Right lower leg: No edema.     Left lower leg: No edema.     Comments: No tenderness or swelling noted on examination today, skin warm and dry Crepitus of the left knee noted  Skin:    General: Skin is warm and dry.     Capillary Refill: Capillary refill takes less than 2 seconds.     Coloration: Skin is not jaundiced or pale.     Findings: No bruising, erythema or lesion.  Neurological:     Mental Status: She is alert and oriented to person, place, and time.     Motor: No weakness.     Coordination: Coordination normal.     Gait: Gait normal.  Psychiatric:        Mood and Affect: Mood normal.        Behavior: Behavior normal.        Thought Content: Thought content normal.        Judgment: Judgment normal.     BP 112/69   Pulse 69   Temp (!) 97.1 F (36.2 C)   Wt 229 lb (103.9 kg)   SpO2 100%   BMI 38.11 kg/m  Wt Readings from Last 3 Encounters:  01/29/23 229 lb (103.9 kg)  01/02/23 229 lb (103.9 kg)  12/15/22 220 lb (99.8 kg)    No results found for: "TSH" Lab Results  Component Value Date   WBC 6.0 12/15/2022   HGB 13.7 12/15/2022   HCT 41.4 12/15/2022   MCV 89.8 12/15/2022   PLT 330 12/15/2022   Lab Results  Component Value Date   NA 142 12/19/2022   K 3.6 12/19/2022   CO2 23 12/19/2022   GLUCOSE 114 (H) 12/19/2022   BUN 16 12/19/2022   CREATININE 0.71 12/19/2022   BILITOT 0.2 12/05/2022   ALKPHOS 74 12/05/2022   AST 17 12/05/2022   ALT 20 12/05/2022   PROT 7.3 12/05/2022   ALBUMIN 4.6 12/05/2022   CALCIUM 10.0 12/19/2022   ANIONGAP 9 12/15/2022  EGFR 99 12/19/2022   Lab Results  Component Value Date   CHOL 217 (H) 12/05/2022   Lab Results  Component Value Date   HDL 55 12/05/2022   Lab  Results  Component Value Date   LDLCALC 151 (H) 12/05/2022   Lab Results  Component Value Date   TRIG 65 12/05/2022   Lab Results  Component Value Date   CHOLHDL 3.9 12/05/2022   Lab Results  Component Value Date   HGBA1C 6.3 (A) 12/05/2022      Assessment & Plan:   Problem List Items Addressed This Visit       Digestive   Gastroesophageal reflux disease    Currently well-controlled on OTC antacids Will refill pantoprazole 40 mg daily since she is taking diclofenac for her left lower extremity pain.  Can take medication daily as needed after completion of diclofenac.      Relevant Medications   pantoprazole (PROTONIX) 40 MG tablet     Other   Pain and swelling of left lower leg - Primary    Pain is much better swelling has resolved Continue diclofenac 75 mg twice daily as ordered Encouraged to take medication with food and also take with pantoprazole 40 mg daily due to a history of GI hemorrhage Application of heat or ice encouraged      Encounter for examination following treatment at hospital    Hospital chart reviewed, including discharge summary Medications reconciled and reviewed with the patient in detail        Meds ordered this encounter  Medications   pantoprazole (PROTONIX) 40 MG tablet    Sig: Take 1 tablet (40 mg total) by mouth daily.    Dispense:  30 tablet    Refill:  3    Follow-up: No follow-ups on file.    Donell Beers, FNP

## 2023-01-29 NOTE — Assessment & Plan Note (Signed)
Currently well-controlled on OTC antacids Will refill pantoprazole 40 mg daily since she is taking diclofenac for her left lower extremity pain.  Can take medication daily as needed after completion of diclofenac.

## 2023-01-29 NOTE — Patient Instructions (Signed)
1. Primary localized osteoarthritis of knees, bilateral   2. Pain and swelling of left lower leg   3. Gastroesophageal reflux disease, unspecified whether esophagitis present  - pantoprazole (PROTONIX) 40 MG tablet; Take 1 tablet (40 mg total) by mouth daily.  Dispense: 30 tablet; Refill: 3    It is important that you exercise regularly at least 30 minutes 5 times a week as tolerated  Think about what you will eat, plan ahead. Choose " clean, green, fresh or frozen" over canned, processed or packaged foods which are more sugary, salty and fatty. 70 to 75% of food eaten should be vegetables and fruit. Three meals at set times with snacks allowed between meals, but they must be fruit or vegetables. Aim to eat over a 12 hour period , example 7 am to 7 pm, and STOP after  your last meal of the day. Drink water,generally about 64 ounces per day, no other drink is as healthy. Fruit juice is best enjoyed in a healthy way, by EATING the fruit.  Thanks for choosing [facility] we consider it a privelige to serve you.

## 2023-01-31 ENCOUNTER — Other Ambulatory Visit: Payer: Self-pay | Admitting: Nurse Practitioner

## 2023-01-31 DIAGNOSIS — K219 Gastro-esophageal reflux disease without esophagitis: Secondary | ICD-10-CM

## 2023-01-31 DIAGNOSIS — Z1231 Encounter for screening mammogram for malignant neoplasm of breast: Secondary | ICD-10-CM

## 2023-02-25 ENCOUNTER — Ambulatory Visit: Payer: Self-pay | Admitting: Nurse Practitioner

## 2023-03-08 ENCOUNTER — Ambulatory Visit
Admission: RE | Admit: 2023-03-08 | Discharge: 2023-03-08 | Disposition: A | Discharge status: Home / Self Care | Payer: Medicaid Other | Source: Ambulatory Visit | Attending: Nurse Practitioner

## 2023-03-08 DIAGNOSIS — Z1231 Encounter for screening mammogram for malignant neoplasm of breast: Secondary | ICD-10-CM | POA: Diagnosis not present

## 2023-04-02 ENCOUNTER — Ambulatory Visit (INDEPENDENT_AMBULATORY_CARE_PROVIDER_SITE_OTHER): Payer: 59 | Admitting: Nurse Practitioner

## 2023-04-02 ENCOUNTER — Encounter: Payer: Self-pay | Admitting: Nurse Practitioner

## 2023-04-02 VITALS — BP 129/79 | HR 75 | Ht 65.0 in | Wt 227.0 lb

## 2023-04-02 DIAGNOSIS — R7303 Prediabetes: Secondary | ICD-10-CM | POA: Diagnosis not present

## 2023-04-02 DIAGNOSIS — Z0000 Encounter for general adult medical examination without abnormal findings: Secondary | ICD-10-CM | POA: Diagnosis not present

## 2023-04-02 DIAGNOSIS — Z6838 Body mass index (BMI) 38.0-38.9, adult: Secondary | ICD-10-CM | POA: Diagnosis not present

## 2023-04-02 DIAGNOSIS — E66812 Obesity, class 2: Secondary | ICD-10-CM

## 2023-04-02 DIAGNOSIS — E785 Hyperlipidemia, unspecified: Secondary | ICD-10-CM | POA: Diagnosis not present

## 2023-04-02 DIAGNOSIS — R109 Unspecified abdominal pain: Secondary | ICD-10-CM | POA: Diagnosis not present

## 2023-04-02 DIAGNOSIS — N898 Other specified noninflammatory disorders of vagina: Secondary | ICD-10-CM | POA: Diagnosis not present

## 2023-04-02 DIAGNOSIS — I10 Essential (primary) hypertension: Secondary | ICD-10-CM

## 2023-04-02 DIAGNOSIS — Z13228 Encounter for screening for other metabolic disorders: Secondary | ICD-10-CM | POA: Diagnosis not present

## 2023-04-02 DIAGNOSIS — Z1321 Encounter for screening for nutritional disorder: Secondary | ICD-10-CM | POA: Diagnosis not present

## 2023-04-02 DIAGNOSIS — Z1329 Encounter for screening for other suspected endocrine disorder: Secondary | ICD-10-CM | POA: Diagnosis not present

## 2023-04-02 DIAGNOSIS — Z130 Encounter for screening for diseases of the blood and blood-forming organs and certain disorders involving the immune mechanism: Secondary | ICD-10-CM | POA: Diagnosis not present

## 2023-04-02 DIAGNOSIS — Z1159 Encounter for screening for other viral diseases: Secondary | ICD-10-CM | POA: Diagnosis not present

## 2023-04-02 MED ORDER — DICYCLOMINE HCL 10 MG PO CAPS: 10.0000 mg | ORAL_CAPSULE | Freq: Three times a day (TID) | ORAL | 0 refills | Status: DC

## 2023-04-02 MED ORDER — METFORMIN HCL ER 500 MG PO TB24
500.0000 mg | ORAL_TABLET | Freq: Every day | ORAL | 3 refills | Status: DC
Start: 2023-04-02 — End: 2023-07-01

## 2023-04-02 NOTE — Assessment & Plan Note (Signed)
Wt Readings from Last 3 Encounters:  04/02/23 227 lb (103 kg)  01/29/23 229 lb (103.9 kg)  01/02/23 229 lb (103.9 kg)   Body mass index is 37.77 kg/m.  Patient counseled on low-carb diet Encouraged engage in regular moderate exercises at least 150 minutes weekly as tolerated

## 2023-04-02 NOTE — Assessment & Plan Note (Signed)
Annual exam as documented.  Counseling done include healthy lifestyle involving committing to 150 minutes of exercise per week, heart healthy diet, and attaining healthy weight. The importance of adequate sleep also discussed.  Regular use of seat belt and home safety were also discussed . Changes in health habits are decided on by patient with goals and time frames set for achieving them. Immunization and cancer screening  needs are specifically addressed at this visit.  Up-to-date with cervical cancer screening, mammogram, colon cancer screening

## 2023-04-02 NOTE — Assessment & Plan Note (Addendum)
Chronic medical condition currently well-controlled Controlled on Hygroton 25 mg daily, olmesartan 40 mg daily, amlodipine 10 mg daily.  DASH diet and commitment to daily physical activity for a minimum of 30 minutes discussed and encouraged, as a part of hypertension management. The importance of attaining a healthy weight is also discussed.     04/02/2023    3:50 PM 01/29/2023    1:20 PM 01/02/2023    8:34 AM 12/15/2022   11:30 AM 12/15/2022   11:05 AM 12/15/2022    8:20 AM 12/15/2022    8:10 AM  BP/Weight  Systolic BP 129 112 132 130 142  156  Diastolic BP 79 69 75 83 107  93  Wt. (Lbs) 227 229 229   220   BMI 37.77 kg/m2 38.11 kg/m2 38.11 kg/m2   36.61 kg/m2

## 2023-04-02 NOTE — Progress Notes (Addendum)
Complete physical exam  Patient: Kayla Moody   DOB: [birth year]   58 y.o. Female  MRN: [id number]  Subjective:    Chief Complaint  Patient presents with   Annual Exam   Vaginal Discharge    Kayla Moody is a 58 y.o. female  has a past medical history of Chronic tension-type headache, intractable (10/22/2016), Class 2 severe obesity due to excess calories with serious comorbidity and body mass index (BMI) of 38.0 to 38.9 in adult [facility]) (01/02/2023), Essential hypertension (06/28/2016), H. pylori duodenitis (12/02/2020), Helicobacter pylori gastritis (11/30/2020), History of hypercholesterolemia (01/02/2023), Hypertension, Osteoarthritis of both knees (12/05/2022), Osteoarthritis of right wrist (12/05/2022), Prediabetes, and Symptomatic anemia (11/28/2020). who presents today for a complete physical exam. She reports consuming a low carb diet , does walking at work She generally feels well. She reports sleeping poorly  doe to her work schedule, works night shift.    Patient complains of abdominal cramps, diarrhea as soon as she eats, this has been going on for about 2 months.  She denies nausea, vomiting bloody stool.  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  Patient complains of abnormal white thick vaginal discharge, intermittent itching.  She denies fever, dysuria, pelvic pain   Up-to-date with colon cancer screening and cervical cancer screening.  Encouraged to get Tdap vaccine shingles vaccine at the pharmacy, she declined the flu vaccine in the office today.    Most recent fall risk assessment:    05/14/2017    3:12 PM  Fall Risk   Falls in the past year? No     Most recent depression screenings:    04/02/2023    3:49 PM 01/02/2023    8:36 AM  PHQ 2/9 Scores  PHQ - 2 Score 0 0        Patient Care Team: Donell Beers, FNP as PCP - General (Nurse  Practitioner)   Outpatient Medications Prior to Visit  Medication Sig   amLODipine (NORVASC) 10 MG tablet Take 1 tablet (10 mg total) by mouth daily.   chlorthalidone (HYGROTON) 25 MG tablet Take 1 tablet (25 mg total) by mouth daily.   olmesartan (BENICAR) 40 MG tablet Take 1 tablet (40 mg total) by mouth daily.   pantoprazole (PROTONIX) 40 MG tablet Take 1 tablet (40 mg total) by mouth daily.   [DISCONTINUED] metFORMIN (GLUCOPHAGE) 500 MG tablet TAKE 1 TABLET BY MOUTH EVERY DAY   No facility-administered medications prior to visit.    Review of Systems  Constitutional:  Negative for appetite change, chills, fatigue and fever.  HENT:  Negative for congestion, postnasal drip, rhinorrhea and sneezing.   Eyes:  Negative for pain, discharge and itching.  Respiratory:  Negative for cough, shortness of breath and wheezing.   Cardiovascular:  Negative for chest pain, palpitations and leg swelling.  Gastrointestinal:  Positive for abdominal pain and diarrhea. Negative for constipation, nausea and vomiting.  Endocrine: Negative for cold intolerance, heat intolerance and polydipsia.  Genitourinary:  Negative for difficulty urinating, dysuria, flank pain and frequency.  Musculoskeletal:  Positive for arthralgias. Negative for back pain, joint swelling and myalgias.  Skin:  Negative for color change, pallor, rash and wound.  Allergic/Immunologic: Negative for food allergies and immunocompromised state.  Neurological:  Negative for dizziness, facial asymmetry, weakness, numbness and headaches.  Psychiatric/Behavioral:  Negative for behavioral problems, confusion, self-injury and suicidal ideas.        Objective:  BP 129/79   Pulse 75   Ht 5\' 5"  (1.651 m)   Wt 227 lb (103 kg)   SpO2 100%   BMI 37.77 kg/m    Physical Exam Vitals and nursing note reviewed. Exam conducted with a chaperone present.  Constitutional:      General: She is not in acute distress.    Appearance: Normal  appearance. She is obese. She is not ill-appearing, toxic-appearing or diaphoretic.  HENT:     Right Ear: Tympanic membrane, ear canal and external ear normal. There is no impacted cerumen.     Left Ear: Tympanic membrane, ear canal and external ear normal. There is no impacted cerumen.     Nose: Nose normal. No congestion or rhinorrhea.     Mouth/Throat:     Mouth: Mucous membranes are moist.     Pharynx: Oropharynx is clear. No oropharyngeal exudate or posterior oropharyngeal erythema.  Eyes:     General: No scleral icterus.       Right eye: No discharge.        Left eye: No discharge.     Extraocular Movements: Extraocular movements intact.     Conjunctiva/sclera: Conjunctivae normal.  Neck:     Vascular: No carotid bruit.  Cardiovascular:     Rate and Rhythm: Normal rate and regular rhythm.     Pulses: Normal pulses.     Heart sounds: Normal heart sounds. No murmur heard.    No friction rub. No gallop.  Pulmonary:     Effort: Pulmonary effort is normal. No respiratory distress.     Breath sounds: Normal breath sounds. No stridor. No wheezing, rhonchi or rales.  Chest:     Chest wall: No tenderness.  Abdominal:     General: Bowel sounds are normal. There is no distension.     Palpations: Abdomen is soft. There is no mass.     Tenderness: There is abdominal tenderness. There is no right CVA tenderness, left CVA tenderness, guarding or rebound.     Hernia: No hernia is present.     Comments: Tenderness on palpation of right upper quadrant and left upper quadrant and epigastric area.  Musculoskeletal:        General: Swelling present. No tenderness, deformity or signs of injury.     Cervical back: Normal range of motion and neck supple. No rigidity or tenderness.     Right lower leg: No edema.     Left lower leg: No edema.  Lymphadenopathy:     Cervical: No cervical adenopathy.  Skin:    General: Skin is warm and dry.     Capillary Refill: Capillary refill takes less than 2  seconds.     Coloration: Skin is not jaundiced or pale.     Findings: No bruising, erythema, lesion or rash.  Neurological:     Mental Status: She is alert and oriented to person, place, and time.     Cranial Nerves: No cranial nerve deficit.     Sensory: No sensory deficit.     Motor: No weakness.     Coordination: Coordination normal.     Gait: Gait normal.     Deep Tendon Reflexes: Reflexes normal.  Psychiatric:        Mood and Affect: Mood normal.        Behavior: Behavior normal.        Thought Content: Thought content normal.        Judgment: Judgment normal.     No results found  for any visits on 04/02/23.     Assessment & Plan:    Routine Health Maintenance and Physical Exam   There is no immunization history on file for this patient.  Health Maintenance  Topic Date Due   Hepatitis C Screening  Never done   DTaP/Tdap/Td (1 - Tdap) Never done   Cervical Cancer Screening (HPV/Pap Cotest)  Never done   Zoster Vaccines- Shingrix (1 of 2) Never done   COVID-19 Vaccine (1 - 2024-25 season) Never done   INFLUENZA VACCINE  06/10/2023 (Originally 10/11/2022)   MAMMOGRAM  03/07/2025   Colonoscopy  01/28/2031   HIV Screening  Completed   HPV VACCINES  Aged Out    Discussed health benefits of physical activity, and encouraged her to engage in regular exercise appropriate for her age and condition.  Problem List Items Addressed This Visit       Cardiovascular and Mediastinum   Essential hypertension   Chronic medical condition currently well-controlled Controlled on Hygroton 25 mg daily, olmesartan 40 mg daily, amlodipine 10 mg daily.  DASH diet and commitment to daily physical activity for a minimum of 30 minutes discussed and encouraged, as a part of hypertension management. The importance of attaining a healthy weight is also discussed.     04/02/2023    3:50 PM 01/29/2023    1:20 PM 01/02/2023    8:34 AM 12/15/2022   11:30 AM 12/15/2022   11:05 AM 12/15/2022     8:20 AM 12/15/2022    8:10 AM  BP/Weight  Systolic BP 129 112 132 130 142  156  Diastolic BP 79 69 75 83 107  93  Wt. (Lbs) 227 229 229   220   BMI 37.77 kg/m2 38.11 kg/m2 38.11 kg/m2   36.61 kg/m2            Relevant Orders   CMP14+EGFR     Other   Prediabetes   Extended release form of metformin ordered to see if this will help her abdominal cramps   - metFORMIN (GLUCOPHAGE-XR) 500 MG 24 hr tablet; Take 1 tablet (500 mg total) by mouth daily with breakfast.  Dispense: 30 tablet; Refill: 3 Patient counseled on low-carb diet, encouraged to engage in regular moderate exercise as tolerated      Relevant Medications   metFORMIN (GLUCOPHAGE-XR) 500 MG 24 hr tablet   Class 2 severe obesity due to excess calories with serious comorbidity and body mass index (BMI) of 38.0 to 38.9 in adult (HCC)   Wt Readings from Last 3 Encounters:  04/02/23 227 lb (103 kg)  01/29/23 229 lb (103.9 kg)  01/02/23 229 lb (103.9 kg)   Body mass index is 37.77 kg/m.  Patient counseled on low-carb diet Encouraged engage in regular moderate exercises at least 150 minutes weekly as tolerated      Relevant Medications   metFORMIN (GLUCOPHAGE-XR) 500 MG 24 hr tablet   Vaginal discharge   ordered - NuSwab Vaginitis Plus (VG+)       Relevant Orders   NuSwab Vaginitis Plus (VG+)   Abdominal cramps    - CBC - dicyclomine (BENTYL) 10 MG capsule; Take 1 capsule (10 mg total) by mouth 4 (four) times daily -  before meals and at bedtime.  Dispense: 20 capsule; Refill: 0 - H. pylori breath test; Future Regular metformin discontinued ,metformin XR ordered as metformin could be contributing to her symptoms        Relevant Medications   dicyclomine (BENTYL) 10 MG capsule  Other Relevant Orders   CBC   H. pylori breath test   Annual physical exam - Primary   Annual exam as documented.  Counseling done include healthy lifestyle involving committing to 150 minutes of exercise per week, heart healthy  diet, and attaining healthy weight. The importance of adequate sleep also discussed.  Regular use of seat belt and home safety were also discussed . Changes in health habits are decided on by patient with goals and time frames set for achieving them. Immunization and cancer screening  needs are specifically addressed at this visit.  Up-to-date with cervical cancer screening, mammogram, colon cancer screening      Dyslipidemia   Lab Results  Component Value Date   CHOL 217 (H) 12/05/2022   HDL 55 12/05/2022   LDLCALC 151 (H) 12/05/2022   TRIG 65 12/05/2022   CHOLHDL 3.9 12/05/2022   The 10-year ASCVD risk score (Arnett DK, et al., 2019) is: 6.2%   Values used to calculate the score:     Age: 51 years     Sex: Female     Is Non-Hispanic African American: Yes     Diabetic: No     Tobacco smoker: No     Systolic Blood Pressure: 129 mmHg     Is BP treated: Yes     HDL Cholesterol: 55 mg/dL     Total Cholesterol: 217 mg/dL  Avoid fatty fried foods, lose weight      Relevant Orders   Lipid panel   Other Visit Diagnoses       Need for hepatitis C screening test       Relevant Orders   Hepatitis C antibody      Return in about 4 months (around 07/31/2023) for HTN.     Donell Beers, FNP

## 2023-04-02 NOTE — Patient Instructions (Addendum)
Please consider getting Shingrix and Tdap vaccine at local pharmacy.      Due to your past medical history of H. pylori infection lets do another test to make sure you do not have H. pylori infection.  Please stop taking pantoprazole for the next 14 days.  We will do the H. pylori test in 15 days   For type 2 diabetes please start taking metformin 500 mg extended release tablet, prescription sent to your pharmacy    Prediabetes  - metFORMIN (GLUCOPHAGE-XR) 500 MG 24 hr tablet; Take 1 tablet (500 mg total) by mouth daily with breakfast.  Dispense: 30 tablet; Refill: 3  . Abdominal cramps  - dicyclomine (BENTYL) 10 MG capsule; Take 1 capsule (10 mg total) by mouth 4 (four) times daily -  before meals and at bedtime.  Dispense: 20 capsule; Refill: 0     It is important that you exercise regularly at least 30 minutes 5 times a week as tolerated  Think about what you will eat, plan ahead. Choose " clean, green, fresh or frozen" over canned, processed or packaged foods which are more sugary, salty and fatty. 70 to 75% of food eaten should be vegetables and fruit. Three meals at set times with snacks allowed between meals, but they must be fruit or vegetables. Aim to eat over a 12 hour period , example 7 am to 7 pm, and STOP after  your last meal of the day. Drink water,generally about 64 ounces per day, no other drink is as healthy. Fruit juice is best enjoyed in a healthy way, by EATING the fruit.  Thanks for choosing [facility] we consider it a privelige to serve you.

## 2023-04-02 NOTE — Assessment & Plan Note (Signed)
Extended release form of metformin ordered to see if this will help her abdominal cramps   - metFORMIN (GLUCOPHAGE-XR) 500 MG 24 hr tablet; Take 1 tablet (500 mg total) by mouth daily with breakfast.  Dispense: 30 tablet; Refill: 3 Patient counseled on low-carb diet, encouraged to engage in regular moderate exercise as tolerated

## 2023-04-02 NOTE — Assessment & Plan Note (Signed)
-   CBC - dicyclomine (BENTYL) 10 MG capsule; Take 1 capsule (10 mg total) by mouth 4 (four) times daily -  before meals and at bedtime.  Dispense: 20 capsule; Refill: 0 - H. pylori breath test; Future Regular metformin discontinued ,metformin XR ordered as metformin could be contributing to her symptoms

## 2023-04-02 NOTE — Assessment & Plan Note (Signed)
 ordered - NuSwab Vaginitis Plus (VG+)

## 2023-04-02 NOTE — Assessment & Plan Note (Addendum)
Lab Results  Component Value Date   CHOL 217 (H) 12/05/2022   HDL 55 12/05/2022   LDLCALC 151 (H) 12/05/2022   TRIG 65 12/05/2022   CHOLHDL 3.9 12/05/2022   The 10-year ASCVD risk score (Arnett DK, et al., 2019) is: 6.2%   Values used to calculate the score:     Age: 58 years     Sex: Female     Is Non-Hispanic African American: Yes     Diabetic: No     Tobacco smoker: No     Systolic Blood Pressure: 129 mmHg     Is BP treated: Yes     HDL Cholesterol: 55 mg/dL     Total Cholesterol: 217 mg/dL  Avoid fatty fried foods, lose weight

## 2023-04-03 ENCOUNTER — Other Ambulatory Visit: Payer: Self-pay | Admitting: Nurse Practitioner

## 2023-04-03 DIAGNOSIS — E785 Hyperlipidemia, unspecified: Secondary | ICD-10-CM

## 2023-04-03 DIAGNOSIS — N898 Other specified noninflammatory disorders of vagina: Secondary | ICD-10-CM | POA: Diagnosis not present

## 2023-04-03 LAB — CMP14+EGFR
ALT: 20 [IU]/L (ref 0–32)
AST: 22 [IU]/L (ref 0–40)
Albumin: 4.6 g/dL (ref 3.8–4.9)
Alkaline Phosphatase: 71 [IU]/L (ref 44–121)
BUN/Creatinine Ratio: 16 (ref 9–23)
BUN: 12 mg/dL (ref 6–24)
Bilirubin Total: 0.4 mg/dL (ref 0.0–1.2)
CO2: 26 mmol/L (ref 20–29)
Calcium: 10.3 mg/dL — ABNORMAL HIGH (ref 8.7–10.2)
Chloride: 98 mmol/L (ref 96–106)
Creatinine, Ser: 0.73 mg/dL (ref 0.57–1.00)
Globulin, Total: 3.1 g/dL (ref 1.5–4.5)
Glucose: 110 mg/dL — ABNORMAL HIGH (ref 70–99)
Potassium: 3.6 mmol/L (ref 3.5–5.2)
Sodium: 140 mmol/L (ref 134–144)
Total Protein: 7.7 g/dL (ref 6.0–8.5)
eGFR: 96 mL/min/{1.73_m2} (ref >59)

## 2023-04-03 LAB — HEPATITIS C ANTIBODY: Hep C Virus Ab: NONREACTIVE

## 2023-04-03 LAB — CBC
Hematocrit: 42.7 % (ref 34.0–46.6)
Hemoglobin: 14.5 g/dL (ref 11.1–15.9)
MCH: 30.1 pg (ref 26.6–33.0)
MCHC: 34 g/dL (ref 31.5–35.7)
MCV: 89 fL (ref 79–97)
Platelets: 421 10*3/uL (ref 150–450)
RBC: 4.81 x10E6/uL (ref 3.77–5.28)
RDW: 12 % (ref 11.7–15.4)
WBC: 6 10*3/uL (ref 3.4–10.8)

## 2023-04-03 LAB — LIPID PANEL
Chol/HDL Ratio: 4.5 {ratio} — ABNORMAL HIGH (ref 0.0–4.4)
Cholesterol, Total: 206 mg/dL — ABNORMAL HIGH (ref 100–199)
HDL: 46 mg/dL (ref >39)
LDL Chol Calc (NIH): 136 mg/dL — ABNORMAL HIGH (ref 0–99)
Triglycerides: 134 mg/dL (ref 0–149)
VLDL Cholesterol Cal: 24 mg/dL (ref 5–40)

## 2023-04-03 MED ORDER — ATORVASTATIN CALCIUM 10 MG PO TABS
10.0000 mg | ORAL_TABLET | Freq: Every day | ORAL | 1 refills | Status: DC
Start: 2023-04-03 — End: 2023-09-30

## 2023-04-06 LAB — NUSWAB VAGINITIS PLUS (VG+)
Chlamydia trachomatis, NAA: NEGATIVE
Neisseria gonorrhoeae, NAA: NEGATIVE
Trich vag by NAA: NEGATIVE

## 2023-04-17 ENCOUNTER — Other Ambulatory Visit: Payer: Self-pay

## 2023-05-01 ENCOUNTER — Ambulatory Visit: Payer: Self-pay | Admitting: Nurse Practitioner

## 2023-05-21 ENCOUNTER — Other Ambulatory Visit: Payer: Self-pay

## 2023-05-21 ENCOUNTER — Telehealth: Payer: Self-pay

## 2023-05-21 ENCOUNTER — Ambulatory Visit (INDEPENDENT_AMBULATORY_CARE_PROVIDER_SITE_OTHER): Payer: 59 | Admitting: Nurse Practitioner

## 2023-05-21 ENCOUNTER — Encounter: Payer: Self-pay | Admitting: Nurse Practitioner

## 2023-05-21 VITALS — BP 130/78 | HR 65 | Temp 97.3°F | Wt 231.0 lb

## 2023-05-21 DIAGNOSIS — E785 Hyperlipidemia, unspecified: Secondary | ICD-10-CM

## 2023-05-21 DIAGNOSIS — M170 Bilateral primary osteoarthritis of knee: Secondary | ICD-10-CM | POA: Diagnosis not present

## 2023-05-21 DIAGNOSIS — E66812 Obesity, class 2: Secondary | ICD-10-CM

## 2023-05-21 DIAGNOSIS — I10 Essential (primary) hypertension: Secondary | ICD-10-CM | POA: Diagnosis not present

## 2023-05-21 DIAGNOSIS — Z6838 Body mass index (BMI) 38.0-38.9, adult: Secondary | ICD-10-CM | POA: Diagnosis not present

## 2023-05-21 DIAGNOSIS — R7303 Prediabetes: Secondary | ICD-10-CM | POA: Diagnosis not present

## 2023-05-21 MED ORDER — WEGOVY 0.25 MG/0.5ML ~~LOC~~ SOAJ: 0.2500 mg | SUBCUTANEOUS | 0 refills | Status: DC

## 2023-05-21 NOTE — Telephone Encounter (Signed)
 Pharmacy Patient Advocate Encounter   Received notification from CoverMyMeds that prior authorization for [facility] is required/requested.   Insurance verification completed.   The patient is insured through [facility] .   Per test claim: PA required; PA submitted to above mentioned insurance via CoverMyMeds Key/confirmation #/EOC [id number] Status is pending

## 2023-05-21 NOTE — Assessment & Plan Note (Addendum)
 Wt Readings from Last 3 Encounters:  05/21/23 231 lb (104.8 kg)  04/02/23 227 lb (103 kg)  01/29/23 229 lb (103.9 kg)   Body mass index is 38.44 kg/m.  Patient denies personal or family history of MTC or MEN 2.  They denied personal history of pancreatitis.   Start Wegovy 0.25 mg once weekly injection Patient encouraged to avoid fatty fried foods eat smaller portions of meal to help decrease nausea.  Encouraged to report abdominal pain, nausea, vomiting.  Encouraged to engage in regular moderate exercise as tolerated Patient counseled on low-carb diet Follow-up in 4 weeks

## 2023-05-21 NOTE — Assessment & Plan Note (Signed)
 Takes Tylenol as needed Looking at doing knee replacement next year

## 2023-05-21 NOTE — Patient Instructions (Addendum)
 Please consider getting Shingrix and Tdap vaccine at local pharmacy.     1. Prediabetes (Primary)   2. Class 2 severe obesity due to excess calories with serious comorbidity and body mass index (BMI) of 38.0 to 38.9 in adult (HCC)  - Semaglutide-Weight Management (WEGOVY) 0.25 MG/0.5ML SOAJ; Inject 0.25 mg into the skin once a week.  Dispense: 2 mL; Refill: 0  3. Dyslipidemia   4. Essential hypertension    It is important that you exercise regularly at least 30 minutes 5 times a week as tolerated  Think about what you will eat, plan ahead. Choose " clean, green, fresh or frozen" over canned, processed or packaged foods which are more sugary, salty and fatty. 70 to 75% of food eaten should be vegetables and fruit. Three meals at set times with snacks allowed between meals, but they must be fruit or vegetables. Aim to eat over a 12 hour period , example 7 am to 7 pm, and STOP after  your last meal of the day. Drink water,generally about 64 ounces per day, no other drink is as healthy. Fruit juice is best enjoyed in a healthy way, by EATING the fruit.  Thanks for choosing [facility] we consider it a privelige to serve you.

## 2023-05-21 NOTE — Assessment & Plan Note (Signed)
 BP Readings from Last 3 Encounters:  05/21/23 130/78  04/02/23 129/79  01/29/23 112/69   HTN Controlled .  On amlodipine 10 mg daily, chlorthalidone 25 mg daily, olmesartan 40 mg daily Continue current medications. No changes in management. Discussed DASH diet and dietary sodium restrictions Continue to increase dietary efforts and exercise.

## 2023-05-21 NOTE — Progress Notes (Signed)
 Established Patient Office Visit  Subjective:  Patient ID: Kayla Moody, female    DOB: 1965-12-17  Age: 59 y.o. MRN: [id number]  CC:  Chief Complaint  Patient presents with   Hyperlipidemia    Follow up fasting    HPI Kayla Moody is a 58 y.o. female  has a past medical history of Chronic tension-type headache, intractable (10/22/2016), Class 2 severe obesity due to excess calories with serious comorbidity and body mass index (BMI) of 38.0 to 38.9 in adult [facility]) (01/02/2023), Essential hypertension (06/28/2016), H. pylori duodenitis (12/02/2020), Helicobacter pylori gastritis (11/30/2020), History of hypercholesterolemia (01/02/2023), Hypertension, Osteoarthritis of both knees (12/05/2022), Osteoarthritis of right wrist (12/05/2022), Prediabetes, and Symptomatic anemia (11/28/2020).  Patient presents for follow-up for hyperlipidemia  Hyperlipidemia.  Currently on atorvastatin 10 mg daily, stated that she started taking the medication about 2 weeks ago  Obesity.  Stated that she has been trying to follow a low-carb diet she has not been able to exercise much due to her chronic knee pain but does a lot of walking at work.  She is interested in medication to assist with weight loss  Patient currently denies any adverse reactions to her current medications  Patient encouraged to get Tdap vaccine and shingles vaccine at the pharmacy   Past Medical History:  Diagnosis Date   Chronic tension-type headache, intractable 10/22/2016   Class 2 severe obesity due to excess calories with serious comorbidity and body mass index (BMI) of 38.0 to 38.9 in adult [facility]) 01/02/2023   Essential hypertension 06/28/2016   H. pylori duodenitis 12/02/2020   Helicobacter pylori gastritis 11/30/2020   History of hypercholesterolemia 01/02/2023   Hypertension    Osteoarthritis of both knees 12/05/2022   Osteoarthritis of right wrist 12/05/2022   Prediabetes    Symptomatic anemia 11/28/2020    Past  Surgical History:  Procedure Laterality Date   BIOPSY  11/29/2020   Procedure: BIOPSY;  Surgeon: Iva Boop, MD;  Location: [facility] ENDOSCOPY;  Service: Endoscopy;;   CHOLECYSTECTOMY     ESOPHAGOGASTRODUODENOSCOPY (EGD) WITH PROPOFOL N/A 11/29/2020   Procedure: ESOPHAGOGASTRODUODENOSCOPY (EGD) WITH PROPOFOL;  Surgeon: Iva Boop, MD;  Location: [facility] ENDOSCOPY;  Service: Endoscopy;  Laterality: N/A;   HERNIA REPAIR      Family History  Problem Relation Age of Onset   Diabetes Mother    Hypertension Mother    Colon polyps Neg Hx    Colon cancer Neg Hx    Stomach cancer Neg Hx    Esophageal cancer Neg Hx    Rectal cancer Neg Hx     Social History   Socioeconomic History   Marital status: Widowed    Spouse name: Not on file   Number of children: 2   Years of education: Not on file   Highest education level: Associate degree: academic program  Occupational History   Not on file  Tobacco Use   Smoking status: Never   Smokeless tobacco: Never  Vaping Use   Vaping status: Never Used  Substance and Sexual Activity   Alcohol use: No    Alcohol/week: 0.0 standard drinks of alcohol   Drug use: No   Sexual activity: Not Currently  Other Topics Concern   Not on file  Social History Narrative   Lives home alone.    Social Drivers of Health   Financial Resource Strain: Medium Risk (04/01/2023)   Overall Financial Resource Strain (CARDIA)    Difficulty of Paying Living Expenses: Somewhat hard  Food Insecurity: No Food  Insecurity (04/01/2023)   Hunger Vital Sign    Worried About Running Out of Food in the Last Year: Never true    Ran Out of Food in the Last Year: Never true  Recent Concern: Food Insecurity - Food Insecurity Present (01/28/2023)   Hunger Vital Sign    Worried About Running Out of Food in the Last Year: Sometimes true    Ran Out of Food in the Last Year: Sometimes true  Transportation Needs: No Transportation Needs (04/01/2023)   PRAPARE - Therapist, art (Medical): No    Lack of Transportation (Non-Medical): No  Physical Activity: Unknown (04/01/2023)   Exercise Vital Sign    Days of Exercise per Week: 0 days    Minutes of Exercise per Session: Not on file  Stress: No Stress Concern Present (04/01/2023)   [employer] of Occupational Health - Occupational Stress Questionnaire    Feeling of Stress : Only a little  Recent Concern: Stress - Stress Concern Present (01/28/2023)   [employer] of Occupational Health - Occupational Stress Questionnaire    Feeling of Stress : Very much  Social Connections: Socially Isolated (04/01/2023)   Social Connection and Isolation Panel [NHANES]    Frequency of Communication with Friends and Family: Once a week    Frequency of Social Gatherings with Friends and Family: Twice a week    Attends Religious Services: Never    Database administrator or Organizations: No    Attends Engineer, structural: Not on file    Marital Status: Widowed  Intimate Partner Violence: Low Risk  (08/08/2020)   Received from [facility]   Intimate Partner Violence    Insults You: Not on file    Threatens You: Not on file    Screams at You: Not on file    Physically Hurt: Not on file    Intimate Partner Violence Score: Not on file    Outpatient Medications Prior to Visit  Medication Sig Dispense Refill   amLODipine (NORVASC) 10 MG tablet Take 1 tablet (10 mg total) by mouth daily. 90 tablet 1   atorvastatin (LIPITOR) 10 MG tablet Take 1 tablet (10 mg total) by mouth daily. 90 tablet 1   chlorthalidone (HYGROTON) 25 MG tablet Take 1 tablet (25 mg total) by mouth daily. 90 tablet 0   metFORMIN (GLUCOPHAGE-XR) 500 MG 24 hr tablet Take 1 tablet (500 mg total) by mouth daily with breakfast. 30 tablet 3   olmesartan (BENICAR) 40 MG tablet Take 1 tablet (40 mg total) by mouth daily. 90 tablet 0   dicyclomine (BENTYL) 10 MG capsule Take 1 capsule (10 mg total) by mouth 4 (four) times daily -   before meals and at bedtime. (Patient not taking: Reported on 05/21/2023) 20 capsule 0   pantoprazole (PROTONIX) 40 MG tablet Take 1 tablet (40 mg total) by mouth daily. (Patient not taking: Reported on 05/21/2023) 30 tablet 3   No facility-administered medications prior to visit.    Allergies  Allergen Reactions   Penicillins Anaphylaxis and Other (See Comments)    Family history of severe allergic reactions to this, so the patient does not attempt it. Tolerated amoxicillin 11/30/2020, no issues noted.     ROS Review of Systems  Constitutional:  Negative for appetite change, chills, fatigue and fever.  HENT:  Negative for congestion, postnasal drip, rhinorrhea and sneezing.   Respiratory:  Negative for cough, shortness of breath and wheezing.   Cardiovascular:  Negative for  chest pain, palpitations and leg swelling.  Gastrointestinal:  Negative for abdominal pain, constipation, nausea and vomiting.  Genitourinary:  Negative for difficulty urinating, dysuria, flank pain and frequency.  Musculoskeletal:  Negative for arthralgias, back pain, joint swelling and myalgias.  Skin:  Negative for color change, pallor, rash and wound.  Neurological:  Negative for dizziness, facial asymmetry, weakness, numbness and headaches.  Psychiatric/Behavioral:  Negative for behavioral problems, confusion, self-injury and suicidal ideas.       Objective:    Physical Exam Vitals and nursing note reviewed.  Constitutional:      General: She is not in acute distress.    Appearance: Normal appearance. She is obese. She is not ill-appearing, toxic-appearing or diaphoretic.  HENT:     Mouth/Throat:     Mouth: Mucous membranes are moist.     Pharynx: Oropharynx is clear. No oropharyngeal exudate or posterior oropharyngeal erythema.  Eyes:     General: No scleral icterus.       Right eye: No discharge.        Left eye: No discharge.     Extraocular Movements: Extraocular movements intact.      Conjunctiva/sclera: Conjunctivae normal.  Cardiovascular:     Rate and Rhythm: Normal rate and regular rhythm.     Pulses: Normal pulses.     Heart sounds: Normal heart sounds. No murmur heard.    No friction rub. No gallop.  Pulmonary:     Effort: Pulmonary effort is normal. No respiratory distress.     Breath sounds: Normal breath sounds. No stridor. No wheezing, rhonchi or rales.  Chest:     Chest wall: No tenderness.  Abdominal:     General: There is no distension.     Palpations: Abdomen is soft.     Tenderness: There is no abdominal tenderness. There is no right CVA tenderness, left CVA tenderness or guarding.  Musculoskeletal:        General: No swelling, tenderness, deformity or signs of injury.     Right lower leg: No edema.     Left lower leg: No edema.  Skin:    General: Skin is warm and dry.     Capillary Refill: Capillary refill takes 2 to 3 seconds.     Coloration: Skin is not jaundiced or pale.     Findings: No bruising, erythema or lesion.  Neurological:     Mental Status: She is alert and oriented to person, place, and time.     Motor: No weakness.     Coordination: Coordination normal.     Gait: Gait normal.  Psychiatric:        Mood and Affect: Mood normal.        Behavior: Behavior normal.        Thought Content: Thought content normal.        Judgment: Judgment normal.     BP 130/78   Pulse 65   Temp (!) 97.3 F (36.3 C)   Wt 231 lb (104.8 kg)   SpO2 98%   BMI 38.44 kg/m  Wt Readings from Last 3 Encounters:  05/21/23 231 lb (104.8 kg)  04/02/23 227 lb (103 kg)  01/29/23 229 lb (103.9 kg)    No results found for: "TSH" Lab Results  Component Value Date   WBC 6.0 04/02/2023   HGB 14.5 04/02/2023   HCT 42.7 04/02/2023   MCV 89 04/02/2023   PLT 421 04/02/2023   Lab Results  Component Value Date   NA 140 04/02/2023  K 3.6 04/02/2023   CO2 26 04/02/2023   GLUCOSE 110 (H) 04/02/2023   BUN 12 04/02/2023   CREATININE 0.73 04/02/2023    BILITOT 0.4 04/02/2023   ALKPHOS 71 04/02/2023   AST 22 04/02/2023   ALT 20 04/02/2023   PROT 7.7 04/02/2023   ALBUMIN 4.6 04/02/2023   CALCIUM 10.3 (H) 04/02/2023   ANIONGAP 9 12/15/2022   EGFR 96 04/02/2023   Lab Results  Component Value Date   CHOL 206 (H) 04/02/2023   Lab Results  Component Value Date   HDL 46 04/02/2023   Lab Results  Component Value Date   LDLCALC 136 (H) 04/02/2023   Lab Results  Component Value Date   TRIG 134 04/02/2023   Lab Results  Component Value Date   CHOLHDL 4.5 (H) 04/02/2023   Lab Results  Component Value Date   HGBA1C 6.3 (A) 12/05/2022      Assessment & Plan:   Problem List Items Addressed This Visit       Cardiovascular and Mediastinum   Essential hypertension   BP Readings from Last 3 Encounters:  05/21/23 130/78  04/02/23 129/79  01/29/23 112/69   HTN Controlled .  On amlodipine 10 mg daily, chlorthalidone 25 mg daily, olmesartan 40 mg daily Continue current medications. No changes in management. Discussed DASH diet and dietary sodium restrictions Continue to increase dietary efforts and exercise.           Musculoskeletal and Integument   Bilateral primary osteoarthritis of knee   Takes Tylenol as needed Looking at doing knee replacement next year        Other   Prediabetes - Primary   She is doing well on metformin 500 mg 24-hour tablet She denies abdominal cramping since starting this medication Continue current medication      Class 2 severe obesity due to excess calories with serious comorbidity and body mass index (BMI) of 38.0 to 38.9 in adult [facility])   Wt Readings from Last 3 Encounters:  05/21/23 231 lb (104.8 kg)  04/02/23 227 lb (103 kg)  01/29/23 229 lb (103.9 kg)   Body mass index is 38.44 kg/m.  Patient denies personal or family history of MTC or MEN 2.  They denied personal history of pancreatitis.   Start Wegovy 0.25 mg once weekly injection Patient encouraged to avoid fatty fried  foods eat smaller portions of meal to help decrease nausea.  Encouraged to report abdominal pain, nausea, vomiting.  Encouraged to engage in regular moderate exercise as tolerated Patient counseled on low-carb diet Follow-up in 4 weeks         Relevant Medications   Semaglutide-Weight Management (WEGOVY) 0.25 MG/0.5ML SOAJ   Dyslipidemia   Lab Results  Component Value Date   CHOL 206 (H) 04/02/2023   HDL 46 04/02/2023   LDLCALC 136 (H) 04/02/2023   TRIG 134 04/02/2023   CHOLHDL 4.5 (H) 04/02/2023  Continue atorvastatin 10 mg daily Will recheck lipid panel at next visit       Meds ordered this encounter  Medications   Semaglutide-Weight Management (WEGOVY) 0.25 MG/0.5ML SOAJ    Sig: Inject 0.25 mg into the skin once a week.    Dispense:  2 mL    Refill:  0    Follow-up: Return in about 4 weeks (around 06/18/2023), or obesity.    Donell Beers, FNP

## 2023-05-21 NOTE — Assessment & Plan Note (Signed)
 Lab Results  Component Value Date   CHOL 206 (H) 04/02/2023   HDL 46 04/02/2023   LDLCALC 136 (H) 04/02/2023   TRIG 134 04/02/2023   CHOLHDL 4.5 (H) 04/02/2023  Continue atorvastatin 10 mg daily Will recheck lipid panel at next visit

## 2023-05-21 NOTE — Assessment & Plan Note (Signed)
 She is doing well on metformin 500 mg 24-hour tablet She denies abdominal cramping since starting this medication Continue current medication

## 2023-05-22 ENCOUNTER — Other Ambulatory Visit: Payer: Self-pay

## 2023-05-22 ENCOUNTER — Telehealth: Payer: Self-pay

## 2023-05-22 NOTE — Telephone Encounter (Signed)
 Pt has been advise [facility]

## 2023-05-23 ENCOUNTER — Other Ambulatory Visit: Payer: Self-pay

## 2023-05-23 NOTE — Telephone Encounter (Signed)
 Pharmacy Patient Advocate Encounter  Received notification from [facility] that Prior Authorization for [facility] has been APPROVED from 05/21/2023 to 11/17/2023   PA #/Case ID/Reference #: [id number]

## 2023-06-10 ENCOUNTER — Other Ambulatory Visit: Payer: Self-pay

## 2023-06-10 DIAGNOSIS — M545 Low back pain, unspecified: Secondary | ICD-10-CM | POA: Diagnosis not present

## 2023-06-10 NOTE — Telephone Encounter (Signed)
 Please advise [facility]

## 2023-06-13 ENCOUNTER — Other Ambulatory Visit: Payer: Self-pay | Admitting: Nurse Practitioner

## 2023-06-13 NOTE — Telephone Encounter (Signed)
 Pt advise dhe is doing good on the 0.5. She had a little diarrhea but she is fine now. KH

## 2023-06-13 NOTE — Telephone Encounter (Signed)
 Per pt she id doing fine. Just a little diarrhea. Please advise [facility]

## 2023-06-18 ENCOUNTER — Encounter (HOSPITAL_COMMUNITY): Payer: Self-pay | Admitting: *Deleted

## 2023-06-18 ENCOUNTER — Ambulatory Visit (HOSPITAL_COMMUNITY): Admission: EM | Admit: 2023-06-18 | Discharge: 2023-06-18 | Disposition: A | Discharge status: Home / Self Care

## 2023-06-18 DIAGNOSIS — S39012D Strain of muscle, fascia and tendon of lower back, subsequent encounter: Secondary | ICD-10-CM

## 2023-06-18 MED ORDER — MELOXICAM 7.5 MG PO TABS: 7.5000 mg | ORAL_TABLET | Freq: Every day | ORAL | 0 refills | Status: DC

## 2023-06-18 MED ORDER — BACLOFEN 20 MG PO TABS
20.0000 mg | ORAL_TABLET | Freq: Three times a day (TID) | ORAL | 0 refills | Status: DC
Start: 2023-06-18 — End: 2023-10-11

## 2023-06-18 NOTE — ED Provider Notes (Signed)
 [facility] [place]  Note:  This document was prepared using Dragon voice recognition software and may include unintentional dictation errors.  MRN: [id number] DOB: [birth year]  Subjective:   Kayla Moody is a 58 y.o. female presenting for left-sided low back pain x 1 week.  Patient does not recall any specific injury or trauma.  Patient was seen previously at another urgent care and diagnosed with muscle strain and prescribed muscle relaxers.  Patient reports no improvement to symptoms with muscle relaxer.  Patient has been taking Tylenol but no anti-inflammatory medication.  Patient denies any past trauma or severe injury to the lower back.  No current facility-administered medications for this encounter.  Current Outpatient Medications:    amLODipine (NORVASC) 10 MG tablet, Take 1 tablet (10 mg total) by mouth daily., Disp: 90 tablet, Rfl: 1   atorvastatin (LIPITOR) 10 MG tablet, Take 1 tablet (10 mg total) by mouth daily., Disp: 90 tablet, Rfl: 1   baclofen (LIORESAL) 20 MG tablet, Take 1 tablet (20 mg total) by mouth 3 (three) times daily., Disp: 30 each, Rfl: 0   metFORMIN (GLUCOPHAGE-XR) 500 MG 24 hr tablet, Take 1 tablet (500 mg total) by mouth daily with breakfast., Disp: 30 tablet, Rfl: 3   olmesartan (BENICAR) 40 MG tablet, Take 1 tablet (40 mg total) by mouth daily., Disp: 90 tablet, Rfl: 0   Semaglutide-Weight Management (WEGOVY) 0.25 MG/0.5ML SOAJ, INJECT 0.25MG  INTO THE SKIN ONE TIME PER WEEK, Disp: 0.5 mL, Rfl: 2   chlorthalidone (HYGROTON) 25 MG tablet, Take 1 tablet (25 mg total) by mouth daily., Disp: 90 tablet, Rfl: 0   dicyclomine (BENTYL) 10 MG capsule, Take 1 capsule (10 mg total) by mouth 4 (four) times daily -  before meals and at bedtime. (Patient not taking: Reported on 05/21/2023), Disp: 20 capsule, Rfl: 0   meloxicam (MOBIC) 7.5 MG tablet, Take 1 tablet (7.5 mg total) by mouth daily., Disp: 30 tablet, Rfl: 0   pantoprazole (PROTONIX) 40 MG tablet, Take 1  tablet (40 mg total) by mouth daily. (Patient not taking: Reported on 05/21/2023), Disp: 30 tablet, Rfl: 3   Allergies  Allergen Reactions   Penicillins Anaphylaxis and Other (See Comments)    Family history of severe allergic reactions to this, so the patient does not attempt it. Tolerated amoxicillin 11/30/2020, no issues noted.     Past Medical History:  Diagnosis Date   Chronic tension-type headache, intractable 10/22/2016   Class 2 severe obesity due to excess calories with serious comorbidity and body mass index (BMI) of 38.0 to 38.9 in adult [facility]) 01/02/2023   Essential hypertension 06/28/2016   H. pylori duodenitis 12/02/2020   Helicobacter pylori gastritis 11/30/2020   History of hypercholesterolemia 01/02/2023   Hypertension    Osteoarthritis of both knees 12/05/2022   Osteoarthritis of right wrist 12/05/2022   Prediabetes    Symptomatic anemia 11/28/2020     Past Surgical History:  Procedure Laterality Date   BIOPSY  11/29/2020   Procedure: BIOPSY;  Surgeon: Iva Boop, MD;  Location: [facility] ENDOSCOPY;  Service: Endoscopy;;   CHOLECYSTECTOMY     ESOPHAGOGASTRODUODENOSCOPY (EGD) WITH PROPOFOL N/A 11/29/2020   Procedure: ESOPHAGOGASTRODUODENOSCOPY (EGD) WITH PROPOFOL;  Surgeon: Iva Boop, MD;  Location: [facility] ENDOSCOPY;  Service: Endoscopy;  Laterality: N/A;   HERNIA REPAIR      Family History  Problem Relation Age of Onset   Diabetes Mother    Hypertension Mother    Colon polyps Neg Hx    Colon cancer Neg  Hx    Stomach cancer Neg Hx    Esophageal cancer Neg Hx    Rectal cancer Neg Hx     Social History   Tobacco Use   Smoking status: Never   Smokeless tobacco: Never  Vaping Use   Vaping status: Never Used  Substance Use Topics   Alcohol use: No    Alcohol/week: 0.0 standard drinks of alcohol   Drug use: No    ROS Refer to HPI for ROS details.  Objective:   Vitals: BP 139/87 (BP Location: Left Arm)   Pulse 69   Temp 98.2 F (36.8 C) (Oral)    Resp 18   SpO2 98%   Physical Exam Vitals and nursing note reviewed.  Constitutional:      General: She is not in acute distress.    Appearance: Normal appearance. She is well-developed. She is not ill-appearing or toxic-appearing.  HENT:     Head: Normocephalic.  Cardiovascular:     Rate and Rhythm: Normal rate.  Pulmonary:     Effort: Pulmonary effort is normal. No respiratory distress.  Musculoskeletal:     Lumbar back: Spasms and tenderness present. No swelling, deformity or bony tenderness. Decreased range of motion.  Skin:    General: Skin is warm and dry.  Neurological:     General: No focal deficit present.     Mental Status: She is alert and oriented to person, place, and time.  Psychiatric:        Mood and Affect: Mood normal.        Behavior: Behavior normal.     Procedures  No results found for this or any previous visit (from the past 24 hours).  Assessment and Plan :   1. Strain of lumbar region, subsequent encounter (Primary) - baclofen (LIORESAL) 20 MG tablet; Take 1 tablet (20 mg total) by mouth 3 (three) times daily.  Dispense: 30 each; Refill: 0 - meloxicam (MOBIC) 7.5 MG tablet; Take 1 tablet (7.5 mg total) by mouth daily.  Dispense: 30 tablet; Refill: 0 - AMB referral to sports medicine for follow-up evaluation and management if symptoms persist with medication management. -Sports medicine may recommend physical therapy or an MRI of the spine to determine cause of lower back pain. -Continue to monitor symptoms for any change in severity if there is any escalation of current symptoms or development of new symptoms follow-up in ER for further evaluation and management.  Lucky Cowboy   [place], [place], [place] 06/18/23 1447

## 2023-06-18 NOTE — Discharge Instructions (Addendum)
 1. Strain of lumbar region, subsequent encounter (Primary) - baclofen (LIORESAL) 20 MG tablet; Take 1 tablet (20 mg total) by mouth 3 (three) times daily.  Dispense: 30 each; Refill: 0 - meloxicam (MOBIC) 7.5 MG tablet; Take 1 tablet (7.5 mg total) by mouth daily.  Dispense: 30 tablet; Refill: 0 - AMB referral to sports medicine for follow-up evaluation and management if symptoms persist with medication management. -Sports medicine may recommend physical therapy or an MRI of the spine to determine cause of lower back pain.

## 2023-06-18 NOTE — ED Triage Notes (Signed)
 Pt states she has had low back pain X 1 week mostly on the left side.She states she woke up with the back pain doesn't recall any injury.  She was seen at [facility] and advised she had a pulled muscle she is taking muscle relaxer without relief.

## 2023-06-20 ENCOUNTER — Other Ambulatory Visit (HOSPITAL_COMMUNITY): Payer: Self-pay

## 2023-06-20 ENCOUNTER — Ambulatory Visit: Payer: Self-pay

## 2023-06-20 ENCOUNTER — Encounter: Payer: Self-pay | Admitting: Internal Medicine

## 2023-06-20 ENCOUNTER — Ambulatory Visit: Admitting: Internal Medicine

## 2023-06-20 ENCOUNTER — Telehealth: Payer: Self-pay

## 2023-06-20 VITALS — BP 120/80 | HR 72 | Temp 98.1°F | Resp 18 | Ht 65.0 in | Wt 231.4 lb

## 2023-06-20 DIAGNOSIS — I10 Essential (primary) hypertension: Secondary | ICD-10-CM | POA: Diagnosis not present

## 2023-06-20 DIAGNOSIS — M5416 Radiculopathy, lumbar region: Secondary | ICD-10-CM | POA: Diagnosis not present

## 2023-06-20 DIAGNOSIS — R7303 Prediabetes: Secondary | ICD-10-CM

## 2023-06-20 MED ORDER — HYDROCODONE-ACETAMINOPHEN 5-325 MG PO TABS
1.0000 | ORAL_TABLET | Freq: Four times a day (QID) | ORAL | 0 refills | Status: DC | PRN
Start: 2023-06-20 — End: 2023-10-11

## 2023-06-20 MED ORDER — PREDNISONE 10 MG PO TABS: ORAL_TABLET | ORAL | 0 refills | Status: DC

## 2023-06-20 MED ORDER — KETOROLAC TROMETHAMINE 30 MG/ML IJ SOLN
30.0000 mg | Freq: Once | INTRAMUSCULAR | Status: AC
Start: 2023-06-20 — End: 2023-06-20
  Administered 2023-06-20: 30 mg via INTRAMUSCULAR

## 2023-06-20 MED ORDER — CYCLOBENZAPRINE HCL 5 MG PO TABS: 5.0000 mg | ORAL_TABLET | Freq: Three times a day (TID) | ORAL | 1 refills | Status: DC | PRN

## 2023-06-20 NOTE — Progress Notes (Signed)
 Patient ID: Kayla Moody, female   DOB: [birth year], 58 y.o.   MRN: [id number]        Chief Complaint: follow up left lumbar radiculopathy,  htn, preDM       HPI:  Kayla Moody is a 58 y.o. female here with c/o 5 days acute onset without inciting event of left lower back pain, constant, mod to severe, with radiation to the left hip and upper leg, without numbness or weakness  Was seen at UC 2 days ago but baclofen makes her sleepy.  LS spine films from 2022 are c/w lumbar DJD.  No falls or injury, fever, chills, and Denies urinary symptoms such as dysuria, frequency, urgency, flank pain, hematuria or n/v, fever, chills.  UA 10 days ago neg for infection.  UC did order MRI but pt states it is not scheduled until may, and needs better pain control in order to go back to work tomorrow, which does not involve heavy lifting or bending.         Wt Readings from Last 3 Encounters:  06/20/23 231 lb 6.4 oz (105 kg)  05/21/23 231 lb (104.8 kg)  04/02/23 227 lb (103 kg)   BP Readings from Last 3 Encounters:  06/20/23 120/80  06/18/23 139/87  05/21/23 130/78         Past Medical History:  Diagnosis Date   Chronic tension-type headache, intractable 10/22/2016   Class 2 severe obesity due to excess calories with serious comorbidity and body mass index (BMI) of 38.0 to 38.9 in adult [facility]) 01/02/2023   Essential hypertension 06/28/2016   H. pylori duodenitis 12/02/2020   Helicobacter pylori gastritis 11/30/2020   History of hypercholesterolemia 01/02/2023   Hypertension    Osteoarthritis of both knees 12/05/2022   Osteoarthritis of right wrist 12/05/2022   Prediabetes    Symptomatic anemia 11/28/2020   Past Surgical History:  Procedure Laterality Date   BIOPSY  11/29/2020   Procedure: BIOPSY;  Surgeon: Iva Boop, MD;  Location: [facility] ENDOSCOPY;  Service: Endoscopy;;   CHOLECYSTECTOMY     ESOPHAGOGASTRODUODENOSCOPY (EGD) WITH PROPOFOL N/A 11/29/2020   Procedure: ESOPHAGOGASTRODUODENOSCOPY  (EGD) WITH PROPOFOL;  Surgeon: Iva Boop, MD;  Location: [facility] ENDOSCOPY;  Service: Endoscopy;  Laterality: N/A;   HERNIA REPAIR      reports that she has never smoked. She has never used smokeless tobacco. She reports that she does not drink alcohol and does not use drugs. family history includes Diabetes in her mother; Hypertension in her mother. Allergies  Allergen Reactions   Penicillins Anaphylaxis and Other (See Comments)    Family history of severe allergic reactions to this, so the patient does not attempt it. Tolerated amoxicillin 11/30/2020, no issues noted.    Current Outpatient Medications on File Prior to Visit  Medication Sig Dispense Refill   amLODipine (NORVASC) 10 MG tablet Take 1 tablet (10 mg total) by mouth daily. 90 tablet 1   atorvastatin (LIPITOR) 10 MG tablet Take 1 tablet (10 mg total) by mouth daily. 90 tablet 1   baclofen (LIORESAL) 20 MG tablet Take 1 tablet (20 mg total) by mouth 3 (three) times daily. 30 each 0   chlorthalidone (HYGROTON) 25 MG tablet Take 1 tablet (25 mg total) by mouth daily. 90 tablet 0   dicyclomine (BENTYL) 10 MG capsule Take 1 capsule (10 mg total) by mouth 4 (four) times daily -  before meals and at bedtime. 20 capsule 0   meloxicam (MOBIC) 7.5 MG tablet Take 1 tablet (7.5  mg total) by mouth daily. 30 tablet 0   metFORMIN (GLUCOPHAGE-XR) 500 MG 24 hr tablet Take 1 tablet (500 mg total) by mouth daily with breakfast. 30 tablet 3   olmesartan (BENICAR) 40 MG tablet Take 1 tablet (40 mg total) by mouth daily. 90 tablet 0   pantoprazole (PROTONIX) 40 MG tablet Take 1 tablet (40 mg total) by mouth daily. 30 tablet 3   Semaglutide-Weight Management (WEGOVY) 0.25 MG/0.5ML SOAJ INJECT 0.25MG  INTO THE SKIN ONE TIME PER WEEK 0.5 mL 2   No current facility-administered medications on file prior to visit.        ROS:  All others reviewed and negative.  Objective        PE:  BP 120/80 (BP Location: Left Arm, Patient Position: Sitting, Cuff  Size: Large)   Pulse 72   Temp 98.1 F (36.7 C) (Temporal)   Resp 18   Ht 5\' 5"  (1.651 m)   Wt 231 lb 6.4 oz (105 kg)   SpO2 98%   BMI 38.51 kg/m                 Constitutional: Pt appears in NAD               HENT: Head: NCAT.                Right Ear: External ear normal.                 Left Ear: External ear normal.                Eyes: . Pupils are equal, round, and reactive to light. Conjunctivae and EOM are normal               Nose: without d/c or deformity               Neck: Neck supple. Gross normal ROM               Cardiovascular: Normal rate and regular rhythm.                 Pulmonary/Chest: Effort normal and breath sounds without rales or wheezing.                Abd:  Soft, NT, ND, + BS, no organomegaly               Neurological: Pt is alert. At baseline orientation, motor grossly intact               Skin: Skin is warm. No rashes, no other new lesions, LE edema - none               Psychiatric: Pt behavior is normal without agitation   Micro: none  Cardiac tracings I have personally interpreted today:  none  Pertinent Radiological findings (summarize): none   Lab Results  Component Value Date   WBC 6.0 04/02/2023   HGB 14.5 04/02/2023   HCT 42.7 04/02/2023   PLT 421 04/02/2023   GLUCOSE 110 (H) 04/02/2023   CHOL 206 (H) 04/02/2023   TRIG 134 04/02/2023   HDL 46 04/02/2023   LDLCALC 136 (H) 04/02/2023   ALT 20 04/02/2023   AST 22 04/02/2023   NA 140 04/02/2023   K 3.6 04/02/2023   CL 98 04/02/2023   CREATININE 0.73 04/02/2023   BUN 12 04/02/2023   CO2 26 04/02/2023   INR 1.2 11/28/2020   HGBA1C 6.3 (A) 12/05/2022   Assessment/Plan:  Kayla Moody is a 58 y.o. Black or African American [2] female with  has a past medical history of Chronic tension-type headache, intractable (10/22/2016), Class 2 severe obesity due to excess calories with serious comorbidity and body mass index (BMI) of 38.0 to 38.9 in adult [facility]) (01/02/2023), Essential  hypertension (06/28/2016), H. pylori duodenitis (12/02/2020), Helicobacter pylori gastritis (11/30/2020), History of hypercholesterolemia (01/02/2023), Hypertension, Osteoarthritis of both knees (12/05/2022), Osteoarthritis of right wrist (12/05/2022), Prediabetes, and Symptomatic anemia (11/28/2020).  Essential hypertension BP Readings from Last 3 Encounters:  06/20/23 120/80  06/18/23 139/87  05/21/23 130/78   Stable, pt to continue medical treatment norvasc 10 every day, hygroton 25 every day, benicar 40 qd    Prediabetes Lab Results  Component Value Date   HGBA1C 6.3 (A) 12/05/2022   Stable, pt to continue current medical treatment metformin ER 500 gm qd   Left lumbar radiculopathy New recent, mod to severe pain worsening with left leg involvement - for MRI ls spine sooner, hydrocodone 5 325 qid prn #30 only, flexeril 5 tid prn, and prednisone taper.  May need orthopedic if not improved.    Followup: Return if symptoms worsen or fail to improve.  Oliver Barre, MD 06/20/2023 3:52 PM  [facility]

## 2023-06-20 NOTE — Assessment & Plan Note (Signed)
 Lab Results  Component Value Date   HGBA1C 6.3 (A) 12/05/2022   Stable, pt to continue current medical treatment metformin ER 500 gm qd

## 2023-06-20 NOTE — Telephone Encounter (Signed)
 Pharmacy Patient Advocate Encounter   Received notification from CoverMyMeds that prior authorization for HYDROcodone-Acetaminophen 5-325MG  tablets is required/requested.   Insurance verification completed.   The patient is insured through [facility] .   Per test claim: PA required; PA submitted to above mentioned insurance via CoverMyMeds Key/confirmation #/EOC (Key: [id number])   Status is pending

## 2023-06-20 NOTE — Patient Instructions (Signed)
 You had the pain shot today (toradol 30 mg)  Please take all new medication as prescribed - the hydrocodone as needed for pain, flexeril 5 mg as needed for muscle relaxer, and prednisone  Please continue all other medications as before  Please have the pharmacy call with any other refills you may need.  Please keep your appointments with your specialists as you may have planned  You will be contacted regarding the referral for: MRI for lower back  You will be contacted by phone if any changes need to be made immediately.  Otherwise, you will receive a letter about your results with an explanation, but please check with MyChart first.

## 2023-06-20 NOTE — Addendum Note (Signed)
 Addended by: Mertha Finders on: 06/20/2023 04:03 PM   Modules accepted: Orders

## 2023-06-20 NOTE — Assessment & Plan Note (Signed)
 BP Readings from Last 3 Encounters:  06/20/23 120/80  06/18/23 139/87  05/21/23 130/78   Stable, pt to continue medical treatment norvasc 10 every day, hygroton 25 every day, benicar 40 qd

## 2023-06-20 NOTE — Assessment & Plan Note (Signed)
 New recent, mod to severe pain worsening with left leg involvement - for MRI ls spine sooner, hydrocodone 5 325 qid prn #30 only, flexeril 5 tid prn, and prednisone taper.  May need orthopedic if not improved.

## 2023-06-20 NOTE — Telephone Encounter (Signed)
 Chief Complaint: Back pain Symptoms: back pain on left side Frequency: since Monday Pertinent Negatives: Patient denies numbness, tingling, weakness of arms or legs, difficulty controlling bowel/bladder, pain with urination Disposition: [] ED /[] Urgent Care (no appt availability in office) / [x] Appointment(In office/virtual)/ []  [facility]/ [] Home Care/ [] Refused Recommended Disposition /[] [facility]/ []  Follow-up with PCP Additional Notes: Patient called in stating she is having constant back pain in the lower left side of the back right above the buttock. Patient has suspicion it is a reaction from her Wegovy injection, stating it set in after she took her shot on Friday. Patient was seen in UC twice and told it was inflammation and given specific medication, however; patient instructed to follow up with PCP and would like further evaluation before her Wegovy injection tomorrow. Acute appt made for further evaluation.    Copied from CRM [id number]. Topic: Clinical - Red Word Triage >> Jun 20, 2023  8:53 AM Kayla Moody wrote: Red Word that prompted transfer to Nurse Triage: Patient think she is having an allergic reaction to her wegovy shot. Patient is experiencing pain and inflammation on the whole left side Reason for Disposition  [1] MODERATE back pain (e.g., interferes with normal activities) AND [2] present > 3 days  Answer Assessment - Initial Assessment Questions 1. ONSET: "When did the pain begin?"      Monday 2. LOCATION: "Where does it hurt?" (upper, mid or lower back)     Lower part of back, right above buttock, left side 3. SEVERITY: "How bad is the pain?"  (e.g., Scale 1-10; mild, moderate, or severe)   - MILD (1-3): Doesn't interfere with normal activities.    - MODERATE (4-7): Interferes with normal activities or awakens from sleep.    - SEVERE (8-10): Excruciating pain, unable to do any normal activities.      Worsens with certain positions  4. PATTERN:  "Is the pain constant?" (e.g., yes, no; constant, intermittent)      Constant 5. RADIATION: "Does the pain shoot into your legs or somewhere else?"     No 6. CAUSE:  "What do you think is causing the back pain?"      Patient wonders if it has to do with Wegovy injection 7. BACK OVERUSE:  "Any recent lifting of heavy objects, strenuous work or exercise?"     No 8. MEDICINES: "What have you taken so far for the pain?" (e.g., nothing, acetaminophen, NSAIDS)      Meloxicam from Urgent Care 9. NEUROLOGIC SYMPTOMS: "Do you have any weakness, numbness, or problems with bowel/bladder control?"     No 10. OTHER SYMPTOMS: "Do you have any other symptoms?" (e.g., fever, abdomen pain, burning with urination, blood in urine)       No  Protocols used: Back Pain-A-AH

## 2023-06-21 NOTE — Telephone Encounter (Signed)
 Pharmacy Patient Advocate Encounter  Received notification from [facility] that Prior Authorization for  HYDROcodone-Acetaminophen 5-325MG  tablets  has been APPROVED from 06/21/23 to 12/18/23   PA #/Case ID/Reference #: [id number]

## 2023-06-29 ENCOUNTER — Other Ambulatory Visit: Payer: Self-pay | Admitting: Nurse Practitioner

## 2023-06-29 DIAGNOSIS — R7303 Prediabetes: Secondary | ICD-10-CM

## 2023-07-01 ENCOUNTER — Ambulatory Visit: Admitting: Family Medicine

## 2023-07-02 ENCOUNTER — Ambulatory Visit (INDEPENDENT_AMBULATORY_CARE_PROVIDER_SITE_OTHER): Payer: Self-pay | Admitting: Nurse Practitioner

## 2023-07-02 ENCOUNTER — Encounter: Payer: Self-pay | Admitting: Nurse Practitioner

## 2023-07-02 ENCOUNTER — Encounter (INDEPENDENT_AMBULATORY_CARE_PROVIDER_SITE_OTHER): Payer: Self-pay

## 2023-07-02 ENCOUNTER — Other Ambulatory Visit: Payer: Self-pay | Admitting: Nurse Practitioner

## 2023-07-02 VITALS — BP 146/73 | HR 74 | Temp 97.6°F | Wt 229.0 lb

## 2023-07-02 DIAGNOSIS — Z6838 Body mass index (BMI) 38.0-38.9, adult: Secondary | ICD-10-CM | POA: Diagnosis not present

## 2023-07-02 DIAGNOSIS — I10 Essential (primary) hypertension: Secondary | ICD-10-CM

## 2023-07-02 DIAGNOSIS — M5416 Radiculopathy, lumbar region: Secondary | ICD-10-CM | POA: Diagnosis not present

## 2023-07-02 DIAGNOSIS — E66812 Obesity, class 2: Secondary | ICD-10-CM | POA: Diagnosis not present

## 2023-07-02 MED ORDER — WEGOVY 0.5 MG/0.5ML ~~LOC~~ SOAJ
0.5000 mg | SUBCUTANEOUS | 0 refills | Status: DC
Start: 2023-07-02 — End: 2023-08-15

## 2023-07-02 NOTE — Assessment & Plan Note (Signed)
 Patient currently denies pain Taking Flexeril  5 mg 3 times daily as needed Has completed full course of prednisone  taper ordered She plans on not getting the MRI done because her pain has resolved

## 2023-07-02 NOTE — Progress Notes (Signed)
 Established Patient Office Visit  Subjective:  Patient ID: Kayla Moody, female    DOB: 03-Dec-1965  Age: 58 y.o. MRN: [id number]  CC:  Chief Complaint  Patient presents with   Obesity    HPI Kayla Moody is a 58 y.o. female   has a past medical history of Chronic tension-type headache, intractable (10/22/2016), Class 2 severe obesity due to excess calories with serious comorbidity and body mass index (BMI) of 38.0 to 38.9 in adult [facility]) (01/02/2023), Essential hypertension (06/28/2016), H. pylori duodenitis (12/02/2020), Helicobacter pylori gastritis (11/30/2020), History of hypercholesterolemia (01/02/2023), Hypertension, Osteoarthritis of both knees (12/05/2022), Osteoarthritis of right wrist (12/05/2022), Prediabetes, and Symptomatic anemia (11/28/2020).  Patient presents for follow-up for obesity  Obesity.  Currently on Wegovy  0.25 mg once weekly, she had stopped taking Wegovy  when she developed back pain thinking that Wegovy  was causing the back pain.  But she has restarted the medication since her back pain resolved.  States that she is on a high-protein low-carb diet, plans to start doing more walking exercises.   Left lumbar radiculopathy.  She has completed full course of steroid ordered now takes Flexeril  as needed.she currently denies back pain  Past Medical History:  Diagnosis Date   Chronic tension-type headache, intractable 10/22/2016   Class 2 severe obesity due to excess calories with serious comorbidity and body mass index (BMI) of 38.0 to 38.9 in adult [facility]) 01/02/2023   Essential hypertension 06/28/2016   H. pylori duodenitis 12/02/2020   Helicobacter pylori gastritis 11/30/2020   History of hypercholesterolemia 01/02/2023   Hypertension    Osteoarthritis of both knees 12/05/2022   Osteoarthritis of right wrist 12/05/2022   Prediabetes    Symptomatic anemia 11/28/2020    Past Surgical History:  Procedure Laterality Date   BIOPSY  11/29/2020   Procedure:  BIOPSY;  Surgeon: Kenney Peacemaker, MD;  Location: [facility] ENDOSCOPY;  Service: Endoscopy;;   CHOLECYSTECTOMY     ESOPHAGOGASTRODUODENOSCOPY (EGD) WITH PROPOFOL  N/A 11/29/2020   Procedure: ESOPHAGOGASTRODUODENOSCOPY (EGD) WITH PROPOFOL ;  Surgeon: Kenney Peacemaker, MD;  Location: [facility] ENDOSCOPY;  Service: Endoscopy;  Laterality: N/A;   HERNIA REPAIR      Family History  Problem Relation Age of Onset   Diabetes Mother    Hypertension Mother    Colon polyps Neg Hx    Colon cancer Neg Hx    Stomach cancer Neg Hx    Esophageal cancer Neg Hx    Rectal cancer Neg Hx     Social History   Socioeconomic History   Marital status: Widowed    Spouse name: Not on file   Number of children: 2   Years of education: Not on file   Highest education level: Associate degree: academic program  Occupational History   Not on file  Tobacco Use   Smoking status: Never   Smokeless tobacco: Never  Vaping Use   Vaping status: Never Used  Substance and Sexual Activity   Alcohol use: No    Alcohol/week: 0.0 standard drinks of alcohol   Drug use: No   Sexual activity: Not Currently  Other Topics Concern   Not on file  Social History Narrative   Lives home alone.    Social Drivers of Health   Financial Resource Strain: Medium Risk (04/01/2023)   Overall Financial Resource Strain (CARDIA)    Difficulty of Paying Living Expenses: Somewhat hard  Food Insecurity: No Food Insecurity (04/01/2023)   Hunger Vital Sign    Worried About Running Out of Food in  the Last Year: Never true    Ran Out of Food in the Last Year: Never true  Recent Concern: Food Insecurity - Food Insecurity Present (01/28/2023)   Hunger Vital Sign    Worried About Running Out of Food in the Last Year: Sometimes true    Ran Out of Food in the Last Year: Sometimes true  Transportation Needs: No Transportation Needs (04/01/2023)   PRAPARE - Administrator, Civil Service (Medical): No    Lack of Transportation (Non-Medical): No   Physical Activity: Unknown (04/01/2023)   Exercise Vital Sign    Days of Exercise per Week: 0 days    Minutes of Exercise per Session: Not on file  Stress: No Stress Concern Present (04/01/2023)   [employer] of Occupational Health - Occupational Stress Questionnaire    Feeling of Stress : Only a little  Recent Concern: Stress - Stress Concern Present (01/28/2023)   [employer] of Occupational Health - Occupational Stress Questionnaire    Feeling of Stress : Very much  Social Connections: Socially Isolated (04/01/2023)   Social Connection and Isolation Panel [NHANES]    Frequency of Communication with Friends and Family: Once a week    Frequency of Social Gatherings with Friends and Family: Twice a week    Attends Religious Services: Never    Database administrator or Organizations: No    Attends Engineer, structural: Not on file    Marital Status: Widowed  Intimate Partner Violence: Low Risk  (08/08/2020)   Received from [facility]   Intimate Partner Violence    Insults You: Not on file    Threatens You: Not on file    Screams at You: Not on file    Physically Hurt: Not on file    Intimate Partner Violence Score: Not on file    Outpatient Medications Prior to Visit  Medication Sig Dispense Refill   amLODipine  (NORVASC ) 10 MG tablet Take 1 tablet (10 mg total) by mouth daily. 90 tablet 1   atorvastatin  (LIPITOR) 10 MG tablet Take 1 tablet (10 mg total) by mouth daily. 90 tablet 1   chlorthalidone  (HYGROTON ) 25 MG tablet Take 1 tablet (25 mg total) by mouth daily. 90 tablet 0   cyclobenzaprine  (FLEXERIL ) 5 MG tablet Take 1 tablet (5 mg total) by mouth 3 (three) times daily as needed. 40 tablet 1   metFORMIN  (GLUCOPHAGE -XR) 500 MG 24 hr tablet TAKE 1 TABLET BY MOUTH EVERY DAY WITH BREAKFAST 90 tablet 1   olmesartan  (BENICAR ) 40 MG tablet Take 1 tablet (40 mg total) by mouth daily. 90 tablet 0   Semaglutide -Weight Management (WEGOVY ) 0.25 MG/0.5ML SOAJ INJECT  0.25MG  INTO THE SKIN ONE TIME PER WEEK 0.5 mL 2   baclofen  (LIORESAL ) 20 MG tablet Take 1 tablet (20 mg total) by mouth 3 (three) times daily. (Patient not taking: Reported on 07/02/2023) 30 each 0   dicyclomine  (BENTYL ) 10 MG capsule Take 1 capsule (10 mg total) by mouth 4 (four) times daily -  before meals and at bedtime. (Patient not taking: Reported on 07/02/2023) 20 capsule 0   HYDROcodone -acetaminophen  (NORCO/VICODIN) 5-325 MG tablet Take 1 tablet by mouth every 6 (six) hours as needed. (Patient not taking: Reported on 07/02/2023) 30 tablet 0   meloxicam  (MOBIC ) 7.5 MG tablet Take 1 tablet (7.5 mg total) by mouth daily. (Patient not taking: Reported on 07/02/2023) 30 tablet 0   pantoprazole  (PROTONIX ) 40 MG tablet Take 1 tablet (40 mg total) by mouth  daily. (Patient not taking: Reported on 07/02/2023) 30 tablet 3   predniSONE  (DELTASONE ) 10 MG tablet 3 tabs by mouth per day for 3 days,2tabs per day for 3 days,1tab per day for 3 days (Patient not taking: Reported on 07/02/2023) 18 tablet 0   No facility-administered medications prior to visit.    Allergies  Allergen Reactions   Penicillins Anaphylaxis and Other (See Comments)    Family history of severe allergic reactions to this, so the patient does not attempt it. Tolerated amoxicillin  11/30/2020, no issues noted.     ROS Review of Systems  Constitutional:  Negative for appetite change, chills, fatigue and fever.  HENT:  Negative for congestion, postnasal drip, rhinorrhea and sneezing.   Respiratory:  Negative for cough, shortness of breath and wheezing.   Cardiovascular:  Negative for chest pain, palpitations and leg swelling.  Gastrointestinal:  Negative for abdominal pain, constipation, nausea and vomiting.  Genitourinary:  Negative for difficulty urinating, dysuria, flank pain and frequency.  Musculoskeletal:  Negative for arthralgias, back pain, joint swelling and myalgias.  Skin:  Negative for color change, pallor, rash and wound.   Neurological:  Negative for dizziness, facial asymmetry, weakness, numbness and headaches.  Psychiatric/Behavioral:  Negative for behavioral problems, confusion, self-injury and suicidal ideas.       Objective:    Physical Exam Vitals and nursing note reviewed.  Constitutional:      General: She is not in acute distress.    Appearance: Normal appearance. She is obese. She is not ill-appearing, toxic-appearing or diaphoretic.  HENT:     Mouth/Throat:     Mouth: Mucous membranes are moist.     Pharynx: Oropharynx is clear. No oropharyngeal exudate or posterior oropharyngeal erythema.  Eyes:     General: No scleral icterus.       Right eye: No discharge.        Left eye: No discharge.     Extraocular Movements: Extraocular movements intact.     Conjunctiva/sclera: Conjunctivae normal.  Cardiovascular:     Rate and Rhythm: Normal rate and regular rhythm.     Pulses: Normal pulses.     Heart sounds: Normal heart sounds. No murmur heard.    No friction rub. No gallop.  Pulmonary:     Effort: Pulmonary effort is normal. No respiratory distress.     Breath sounds: Normal breath sounds. No stridor. No wheezing, rhonchi or rales.  Chest:     Chest wall: No tenderness.  Abdominal:     General: There is no distension.     Palpations: Abdomen is soft.     Tenderness: There is no abdominal tenderness. There is no right CVA tenderness, left CVA tenderness or guarding.  Musculoskeletal:        General: No swelling, tenderness, deformity or signs of injury.     Right lower leg: No edema.     Left lower leg: No edema.  Skin:    General: Skin is warm and dry.     Capillary Refill: Capillary refill takes less than 2 seconds.     Coloration: Skin is not jaundiced or pale.     Findings: No bruising, erythema or lesion.  Neurological:     Mental Status: She is alert and oriented to person, place, and time.     Motor: No weakness.     Coordination: Coordination normal.     Gait: Gait  normal.  Psychiatric:        Mood and Affect: Mood normal.  Behavior: Behavior normal.        Thought Content: Thought content normal.        Judgment: Judgment normal.     BP (!) 146/73   Pulse 74   Temp 97.6 F (36.4 C)   Wt 229 lb (103.9 kg)   SpO2 99%   BMI 38.11 kg/m  Wt Readings from Last 3 Encounters:  07/02/23 229 lb (103.9 kg)  06/20/23 231 lb 6.4 oz (105 kg)  05/21/23 231 lb (104.8 kg)    No results found for: "TSH" Lab Results  Component Value Date   WBC 6.0 04/02/2023   HGB 14.5 04/02/2023   HCT 42.7 04/02/2023   MCV 89 04/02/2023   PLT 421 04/02/2023   Lab Results  Component Value Date   NA 140 04/02/2023   K 3.6 04/02/2023   CO2 26 04/02/2023   GLUCOSE 110 (H) 04/02/2023   BUN 12 04/02/2023   CREATININE 0.73 04/02/2023   BILITOT 0.4 04/02/2023   ALKPHOS 71 04/02/2023   AST 22 04/02/2023   ALT 20 04/02/2023   PROT 7.7 04/02/2023   ALBUMIN 4.6 04/02/2023   CALCIUM  10.3 (H) 04/02/2023   ANIONGAP 9 12/15/2022   EGFR 96 04/02/2023   Lab Results  Component Value Date   CHOL 206 (H) 04/02/2023   Lab Results  Component Value Date   HDL 46 04/02/2023   Lab Results  Component Value Date   LDLCALC 136 (H) 04/02/2023   Lab Results  Component Value Date   TRIG 134 04/02/2023   Lab Results  Component Value Date   CHOLHDL 4.5 (H) 04/02/2023   Lab Results  Component Value Date   HGBA1C 6.3 (A) 12/05/2022      Assessment & Plan:   Problem List Items Addressed This Visit       Cardiovascular and Mediastinum   Essential hypertension   Stated that she just took her medications about 40 minutes ago Continue amlodipine  10 mg daily, chlorthalidone  25 mg daily, olmesartan  40 mg daily DASH diet and commitment to daily physical activity for a minimum of 30 minutes discussed and encouraged, as a part of hypertension management. The importance of attaining a healthy weight is also discussed.     07/02/2023    8:17 AM 07/02/2023    8:12  AM 06/20/2023    3:17 PM 06/18/2023    9:14 AM 05/21/2023    8:15 AM 04/02/2023    3:50 PM 01/29/2023    1:20 PM  BP/Weight  Systolic BP 146 150 120 139 130 129 112  Diastolic BP 73 78 80 87 78 79 69  Wt. (Lbs)  229 231.4  231 227 229  BMI  38.11 kg/m2 38.51 kg/m2  38.44 kg/m2 37.77 kg/m2 38.11 kg/m2             Nervous and Auditory   Left lumbar radiculopathy   Patient currently denies pain Taking Flexeril  5 mg 3 times daily as needed Has completed full course of prednisone  taper ordered She plans on not getting the MRI done because her pain has resolved      Relevant Medications   Semaglutide -Weight Management (WEGOVY ) 0.5 MG/0.5ML SOAJ     Other   Class 2 severe obesity due to excess calories with serious comorbidity and body mass index (BMI) of 38.0 to 38.9 in adult [facility]) - Primary   Wt Readings from Last 3 Encounters:  07/02/23 229 lb (103.9 kg)  06/20/23 231 lb 6.4 oz (105 kg)  05/21/23  231 lb (104.8 kg)   Body mass index is 38.11 kg/m.  She has lost 2 pounds since her last visit Continue Wegovy  0.25 mg, after completion of this current dose she will will start Wegovy  0.5 mg once weekly Patient counseled on low-carb diet Encouraged engage in regular moderate exercises at least 150 minutes weekly as tolerated We also discussed referral to medical weight management clinic, referral placed Follow-up in 2 months      Relevant Medications   Semaglutide -Weight Management (WEGOVY ) 0.5 MG/0.5ML SOAJ   Other Relevant Orders   Amb Ref to Medical Weight Management    Meds ordered this encounter  Medications   Semaglutide -Weight Management (WEGOVY ) 0.5 MG/0.5ML SOAJ    Sig: Inject 0.5 mg into the skin once a week.    Dispense:  2 mL    Refill:  0    Follow-up: Return in about 2 months (around 09/01/2023) for obesity.    Siria Calandro R Claudell Wohler, FNP

## 2023-07-02 NOTE — Assessment & Plan Note (Addendum)
 Stated that she just took her medications about 40 minutes ago Continue amlodipine  10 mg daily, chlorthalidone  25 mg daily, olmesartan  40 mg daily DASH diet and commitment to daily physical activity for a minimum of 30 minutes discussed and encouraged, as a part of hypertension management. The importance of attaining a healthy weight is also discussed.     07/02/2023    8:17 AM 07/02/2023    8:12 AM 06/20/2023    3:17 PM 06/18/2023    9:14 AM 05/21/2023    8:15 AM 04/02/2023    3:50 PM 01/29/2023    1:20 PM  BP/Weight  Systolic BP 146 150 120 139 130 129 112  Diastolic BP 73 78 80 87 78 79 69  Wt. (Lbs)  229 231.4  231 227 229  BMI  38.11 kg/m2 38.51 kg/m2  38.44 kg/m2 37.77 kg/m2 38.11 kg/m2

## 2023-07-02 NOTE — Patient Instructions (Signed)
 Please start Wegovy  0.5 mg once weekly injection after completion of the 0.25 mg that you are currently on  It is important that you exercise regularly at least 30 minutes 5 times a week as tolerated  Think about what you will eat, plan ahead. Choose " clean, green, fresh or frozen" over canned, processed or packaged foods which are more sugary, salty and fatty. 70 to 75% of food eaten should be vegetables and fruit. Three meals at set times with snacks allowed between meals, but they must be fruit or vegetables. Aim to eat over a 12 hour period , example 7 am to 7 pm, and STOP after  your last meal of the day. Drink water,generally about 64 ounces per day, no other drink is as healthy. Fruit juice is best enjoyed in a healthy way, by EATING the fruit.  Thanks for choosing [facility] we consider it a privelige to serve you.

## 2023-07-02 NOTE — Assessment & Plan Note (Addendum)
 Wt Readings from Last 3 Encounters:  07/02/23 229 lb (103.9 kg)  06/20/23 231 lb 6.4 oz (105 kg)  05/21/23 231 lb (104.8 kg)   Body mass index is 38.11 kg/m.  She has lost 2 pounds since her last visit Continue Wegovy  0.25 mg, after completion of this current dose she will will start Wegovy  0.5 mg once weekly Patient counseled on low-carb diet Encouraged engage in regular moderate exercises at least 150 minutes weekly as tolerated We also discussed referral to medical weight management clinic, referral placed Follow-up in 2 months

## 2023-07-11 ENCOUNTER — Ambulatory Visit: Admitting: Family Medicine

## 2023-07-15 ENCOUNTER — Ambulatory Visit: Admitting: Nurse Practitioner

## 2023-07-31 ENCOUNTER — Ambulatory Visit: Payer: Self-pay | Admitting: Nurse Practitioner

## 2023-08-13 ENCOUNTER — Encounter (INDEPENDENT_AMBULATORY_CARE_PROVIDER_SITE_OTHER): Payer: Self-pay

## 2023-08-15 ENCOUNTER — Other Ambulatory Visit: Payer: Self-pay | Admitting: Nurse Practitioner

## 2023-08-15 DIAGNOSIS — E66812 Obesity, class 2: Secondary | ICD-10-CM

## 2023-09-02 ENCOUNTER — Ambulatory Visit: Admitting: Nurse Practitioner

## 2023-09-03 ENCOUNTER — Other Ambulatory Visit: Payer: Self-pay | Admitting: Nurse Practitioner

## 2023-09-03 DIAGNOSIS — I10 Essential (primary) hypertension: Secondary | ICD-10-CM

## 2023-09-04 ENCOUNTER — Ambulatory Visit: Admitting: Nurse Practitioner

## 2023-09-05 ENCOUNTER — Other Ambulatory Visit: Payer: Self-pay | Admitting: Nurse Practitioner

## 2023-09-05 DIAGNOSIS — I10 Essential (primary) hypertension: Secondary | ICD-10-CM

## 2023-09-11 ENCOUNTER — Institutional Professional Consult (permissible substitution) (INDEPENDENT_AMBULATORY_CARE_PROVIDER_SITE_OTHER): Admitting: Adult Health

## 2023-09-30 ENCOUNTER — Other Ambulatory Visit: Payer: Self-pay | Admitting: Nurse Practitioner

## 2023-09-30 DIAGNOSIS — E785 Hyperlipidemia, unspecified: Secondary | ICD-10-CM

## 2023-10-10 ENCOUNTER — Other Ambulatory Visit: Payer: Self-pay | Admitting: Nurse Practitioner

## 2023-10-10 DIAGNOSIS — M170 Bilateral primary osteoarthritis of knee: Secondary | ICD-10-CM | POA: Diagnosis not present

## 2023-10-10 DIAGNOSIS — E66812 Obesity, class 2: Secondary | ICD-10-CM

## 2023-10-10 NOTE — Telephone Encounter (Signed)
 Please advise [facility]

## 2023-10-11 ENCOUNTER — Encounter: Payer: Self-pay | Admitting: Nurse Practitioner

## 2023-10-11 ENCOUNTER — Ambulatory Visit (INDEPENDENT_AMBULATORY_CARE_PROVIDER_SITE_OTHER): Admitting: Nurse Practitioner

## 2023-10-11 VITALS — BP 143/74 | HR 68 | Temp 97.0°F | Wt 231.0 lb

## 2023-10-11 DIAGNOSIS — E66812 Obesity, class 2: Secondary | ICD-10-CM | POA: Diagnosis not present

## 2023-10-11 DIAGNOSIS — Z6838 Body mass index (BMI) 38.0-38.9, adult: Secondary | ICD-10-CM

## 2023-10-11 DIAGNOSIS — M170 Bilateral primary osteoarthritis of knee: Secondary | ICD-10-CM | POA: Diagnosis not present

## 2023-10-11 DIAGNOSIS — E785 Hyperlipidemia, unspecified: Secondary | ICD-10-CM

## 2023-10-11 DIAGNOSIS — I10 Essential (primary) hypertension: Secondary | ICD-10-CM

## 2023-10-11 DIAGNOSIS — R7303 Prediabetes: Secondary | ICD-10-CM

## 2023-10-11 MED ORDER — OLMESARTAN MEDOXOMIL 40 MG PO TABS: 40.0000 mg | ORAL_TABLET | Freq: Every day | ORAL | 1 refills | Status: AC

## 2023-10-11 MED ORDER — CHLORTHALIDONE 25 MG PO TABS: 25.0000 mg | ORAL_TABLET | Freq: Every day | ORAL | 1 refills | Status: AC

## 2023-10-11 MED ORDER — AMLODIPINE BESYLATE 10 MG PO TABS: 10.0000 mg | ORAL_TABLET | Freq: Every day | ORAL | 1 refills | Status: AC

## 2023-10-11 MED ORDER — WEGOVY 1 MG/0.5ML ~~LOC~~ SOAJ: 1.0000 mg | SUBCUTANEOUS | 0 refills | Status: DC

## 2023-10-11 NOTE — Assessment & Plan Note (Addendum)
 Wt Readings from Last 3 Encounters:  10/11/23 231 lb (104.8 kg)  07/02/23 229 lb (103.9 kg)  06/20/23 231 lb 6.4 oz (105 kg)  Body mass index is 38.44 kg/m.  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

## 2023-10-11 NOTE — Assessment & Plan Note (Addendum)
 Lab Results  Component Value Date   CHOL 206 (H) 04/02/2023   HDL 46 04/02/2023   LDLCALC 136 (H) 04/02/2023   TRIG 134 04/02/2023   CHOLHDL 4.5 (H) 04/02/2023  On atorvastatin  10 mg daily Checking lipid panel

## 2023-10-11 NOTE — Assessment & Plan Note (Signed)
 Continue Flexeril  5 mg 3 times daily as needed, Tylenol  as needed

## 2023-10-11 NOTE — Assessment & Plan Note (Deleted)
 Continue Flexeril  5 mg 3 times daily as needed, Tylenol  as needed

## 2023-10-11 NOTE — Patient Instructions (Addendum)
 to join [facility], please call us  at [phone]  1. Class 2 severe obesity due to excess calories with serious comorbidity and body mass index (BMI) of 38.0 to 38.9 in adult (HCC) (Primary)  - Semaglutide -Weight Management (WEGOVY ) 1 MG/0.5ML SOAJ; Inject 1 mg into the skin once a week.  Dispense: 2 mL; Refill: 0  It is important that you exercise regularly at least 30 minutes 5 times a week as tolerated  Think about what you will eat, plan ahead. Choose  clean, green, fresh or frozen over canned, processed or packaged foods which are more sugary, salty and fatty. 70 to 75% of food eaten should be vegetables and fruit. Three meals at set times with snacks allowed between meals, but they must be fruit or vegetables. Aim to eat over a 12 hour period , example 7 am to 7 pm, and STOP after  your last meal of the day. Drink water,generally about 64 ounces per day, no other drink is as healthy. Fruit juice is best enjoyed in a healthy way, by EATING the fruit.  Thanks for choosing [facility] we consider it a privelige to serve you.

## 2023-10-11 NOTE — Progress Notes (Signed)
 Established Patient Office Visit  Subjective:  Patient ID: Kayla Moody, female    DOB: [birth year]  Age: 58 y.o. MRN: [id number]  CC:  Chief Complaint  Patient presents with   Weight Check    HPI Kayla Moody is a 58 y.o. female  has a past medical history of Chronic tension-type headache, intractable (10/22/2016), Class 2 severe obesity due to excess calories with serious comorbidity and body mass index (BMI) of 38.0 to 38.9 in adult [facility]) (01/02/2023), Essential hypertension (06/28/2016), H. pylori duodenitis (12/02/2020), Helicobacter pylori gastritis (11/30/2020), History of hypercholesterolemia (01/02/2023), Hypertension, Osteoarthritis of both knees (12/05/2022), Osteoarthritis of right wrist (12/05/2022), Prediabetes, and Symptomatic anemia (11/28/2020).   Patient presents for follow-up for obesity She denies any adverse reactions to current medications Please see assessment and plan section for full HPI and plan     Past Medical History:  Diagnosis Date   Chronic tension-type headache, intractable 10/22/2016   Class 2 severe obesity due to excess calories with serious comorbidity and body mass index (BMI) of 38.0 to 38.9 in adult [facility]) 01/02/2023   Essential hypertension 06/28/2016   H. pylori duodenitis 12/02/2020   Helicobacter pylori gastritis 11/30/2020   History of hypercholesterolemia 01/02/2023   Hypertension    Osteoarthritis of both knees 12/05/2022   Osteoarthritis of right wrist 12/05/2022   Prediabetes    Symptomatic anemia 11/28/2020    Past Surgical History:  Procedure Laterality Date   BIOPSY  11/29/2020   Procedure: BIOPSY;  Surgeon: Avram Lupita BRAVO, MD;  Location: [facility] ENDOSCOPY;  Service: Endoscopy;;   CHOLECYSTECTOMY     ESOPHAGOGASTRODUODENOSCOPY (EGD) WITH PROPOFOL  N/A 11/29/2020   Procedure: ESOPHAGOGASTRODUODENOSCOPY (EGD) WITH PROPOFOL ;  Surgeon: Avram Lupita BRAVO, MD;  Location: [facility] ENDOSCOPY;  Service: Endoscopy;  Laterality: N/A;   HERNIA  REPAIR      Family History  Problem Relation Age of Onset   Diabetes Mother    Hypertension Mother    Colon polyps Neg Hx    Colon cancer Neg Hx    Stomach cancer Neg Hx    Esophageal cancer Neg Hx    Rectal cancer Neg Hx     Social History   Socioeconomic History   Marital status: Widowed    Spouse name: Not on file   Number of children: 2   Years of education: Not on file   Highest education level: Associate degree: academic program  Occupational History   Not on file  Tobacco Use   Smoking status: Never   Smokeless tobacco: Never  Vaping Use   Vaping status: Never Used  Substance and Sexual Activity   Alcohol use: No    Alcohol/week: 0.0 standard drinks of alcohol   Drug use: No   Sexual activity: Not Currently  Other Topics Concern   Not on file  Social History Narrative   Lives home alone.    Social Drivers of Corporate investment banker Strain: Low Risk  (10/09/2023)   Overall Financial Resource Strain (CARDIA)    Difficulty of Paying Living Expenses: Not very hard  Food Insecurity: No Food Insecurity (10/09/2023)   Hunger Vital Sign    Worried About Running Out of Food in the Last Year: Never true    Ran Out of Food in the Last Year: Never true  Transportation Needs: No Transportation Needs (10/09/2023)   PRAPARE - Administrator, Civil Service (Medical): No    Lack of Transportation (Non-Medical): No  Physical Activity: Insufficiently Active (10/09/2023)   Exercise  Vital Sign    Days of Exercise per Week: 2 days    Minutes of Exercise per Session: 30 min  Stress: No Stress Concern Present (10/09/2023)   [employer] of Occupational Health - Occupational Stress Questionnaire    Feeling of Stress: Not at all  Social Connections: Socially Isolated (10/09/2023)   Social Connection and Isolation Panel    Frequency of Communication with Friends and Family: Twice a week    Frequency of Social Gatherings with Friends and Family: Never     Attends Religious Services: Never    Database administrator or Organizations: No    Attends Engineer, structural: Not on file    Marital Status: Widowed  Intimate Partner Violence: Low Risk  (08/08/2020)   Received from [facility]   Intimate Partner Violence    Insults You: Not on file    Threatens You: Not on file    Screams at You: Not on file    Physically Hurt: Not on file    Intimate Partner Violence Score: Not on file    Outpatient Medications Prior to Visit  Medication Sig Dispense Refill   atorvastatin  (LIPITOR) 10 MG tablet TAKE 1 TABLET BY MOUTH EVERY DAY 90 tablet 1   cyclobenzaprine  (FLEXERIL ) 5 MG tablet Take 1 tablet (5 mg total) by mouth 3 (three) times daily as needed. 40 tablet 1   amLODipine  (NORVASC ) 10 MG tablet TAKE 1 TABLET BY MOUTH EVERY DAY 90 tablet 1   olmesartan  (BENICAR ) 40 MG tablet TAKE 1 TABLET BY MOUTH EVERY DAY 90 tablet 0   Semaglutide -Weight Management (WEGOVY ) 0.5 MG/0.5ML SOAJ INJECT 0.5 MG INTO THE SKIN ONE TIME PER WEEK 0.5 mL 1   meloxicam  (MOBIC ) 7.5 MG tablet Take 1 tablet (7.5 mg total) by mouth daily. (Patient not taking: Reported on 10/11/2023) 30 tablet 0   pantoprazole  (PROTONIX ) 40 MG tablet Take 1 tablet (40 mg total) by mouth daily. (Patient not taking: Reported on 10/11/2023) 30 tablet 3   baclofen  (LIORESAL ) 20 MG tablet Take 1 tablet (20 mg total) by mouth 3 (three) times daily. (Patient not taking: Reported on 07/02/2023) 30 each 0   chlorthalidone  (HYGROTON ) 25 MG tablet TAKE 1 TABLET (25 MG TOTAL) BY MOUTH DAILY. 90 tablet 0   dicyclomine  (BENTYL ) 10 MG capsule Take 1 capsule (10 mg total) by mouth 4 (four) times daily -  before meals and at bedtime. (Patient not taking: Reported on 10/11/2023) 20 capsule 0   HYDROcodone -acetaminophen  (NORCO/VICODIN) 5-325 MG tablet Take 1 tablet by mouth every 6 (six) hours as needed. (Patient not taking: Reported on 07/02/2023) 30 tablet 0   metFORMIN  (GLUCOPHAGE -XR) 500 MG 24 hr tablet TAKE 1  TABLET BY MOUTH EVERY DAY WITH BREAKFAST 90 tablet 1   Semaglutide -Weight Management (WEGOVY ) 0.25 MG/0.5ML SOAJ INJECT 0.25MG  INTO THE SKIN ONE TIME PER WEEK 0.5 mL 2   No facility-administered medications prior to visit.    Allergies  Allergen Reactions   Penicillins Anaphylaxis and Other (See Comments)    Family history of severe allergic reactions to this, so the patient does not attempt it. Tolerated amoxicillin  11/30/2020, no issues noted.     ROS Review of Systems  Constitutional:  Negative for appetite change, chills, fatigue and fever.  HENT:  Negative for congestion, postnasal drip, rhinorrhea and sneezing.   Respiratory:  Negative for cough, shortness of breath and wheezing.   Cardiovascular:  Negative for chest pain, palpitations and leg swelling.  Gastrointestinal:  Negative for  abdominal pain, constipation, nausea and vomiting.  Genitourinary:  Negative for difficulty urinating, dysuria, flank pain and frequency.  Musculoskeletal:  Positive for arthralgias. Negative for joint swelling and myalgias.  Skin:  Negative for color change, pallor, rash and wound.  Neurological:  Negative for dizziness, facial asymmetry, weakness, numbness and headaches.  Psychiatric/Behavioral:  Negative for behavioral problems, confusion, self-injury and suicidal ideas.       Objective:    Physical Exam Vitals and nursing note reviewed.  Constitutional:      General: She is not in acute distress.    Appearance: Normal appearance. She is obese. She is not ill-appearing, toxic-appearing or diaphoretic.  Eyes:     General: No scleral icterus.       Right eye: No discharge.        Left eye: No discharge.     Extraocular Movements: Extraocular movements intact.     Conjunctiva/sclera: Conjunctivae normal.  Cardiovascular:     Rate and Rhythm: Normal rate and regular rhythm.     Pulses: Normal pulses.     Heart sounds: Normal heart sounds. No murmur heard.    No friction rub. No gallop.   Pulmonary:     Effort: Pulmonary effort is normal. No respiratory distress.     Breath sounds: Normal breath sounds. No stridor. No wheezing, rhonchi or rales.  Chest:     Chest wall: No tenderness.  Abdominal:     General: There is no distension.     Palpations: Abdomen is soft.     Tenderness: There is no abdominal tenderness. There is no right CVA tenderness, left CVA tenderness or guarding.  Musculoskeletal:        General: No swelling or signs of injury.     Right lower leg: No edema.     Left lower leg: No edema.  Skin:    General: Skin is warm and dry.     Coloration: Skin is not jaundiced or pale.     Findings: No bruising, erythema or lesion.  Neurological:     Mental Status: She is alert and oriented to person, place, and time.     Motor: No weakness.     Coordination: Coordination normal.     Gait: Gait normal.  Psychiatric:        Mood and Affect: Mood normal.        Behavior: Behavior normal.        Thought Content: Thought content normal.        Judgment: Judgment normal.     BP (!) 143/74   Pulse 68   Temp (!) 97 F (36.1 C)   Wt 231 lb (104.8 kg)   SpO2 100%   BMI 38.44 kg/m  Wt Readings from Last 3 Encounters:  10/11/23 231 lb (104.8 kg)  07/02/23 229 lb (103.9 kg)  06/20/23 231 lb 6.4 oz (105 kg)    No results found for: TSH Lab Results  Component Value Date   WBC 6.0 04/02/2023   HGB 14.5 04/02/2023   HCT 42.7 04/02/2023   MCV 89 04/02/2023   PLT 421 04/02/2023   Lab Results  Component Value Date   NA 140 04/02/2023   K 3.6 04/02/2023   CO2 26 04/02/2023   GLUCOSE 110 (H) 04/02/2023   BUN 12 04/02/2023   CREATININE 0.73 04/02/2023   BILITOT 0.4 04/02/2023   ALKPHOS 71 04/02/2023   AST 22 04/02/2023   ALT 20 04/02/2023   PROT 7.7 04/02/2023   ALBUMIN  4.6 04/02/2023   CALCIUM  10.3 (H) 04/02/2023   ANIONGAP 9 12/15/2022   EGFR 96 04/02/2023   Lab Results  Component Value Date   CHOL 206 (H) 04/02/2023   Lab Results   Component Value Date   HDL 46 04/02/2023   Lab Results  Component Value Date   LDLCALC 136 (H) 04/02/2023   Lab Results  Component Value Date   TRIG 134 04/02/2023   Lab Results  Component Value Date   CHOLHDL 4.5 (H) 04/02/2023   Lab Results  Component Value Date   HGBA1C 6.3 (A) 12/05/2022      Assessment & Plan:   Problem List Items Addressed This Visit       Cardiovascular and Mediastinum   Essential hypertension   On Amlodipine  10 mg daily, Hygroton  25 mg daily, olmesartan  40 mg daily , not sure if she has been taking hygroton  or not but states that her systolic blood pressure readings at home has been in the 120s to 130s. DASH diet and commitment to daily physical activity for a minimum of 30 minutes discussed and encouraged, as a part of hypertension management. The importance of attaining a healthy weight is also discussed.     10/11/2023   10:20 AM 10/11/2023   10:11 AM 07/02/2023    8:17 AM 07/02/2023    8:12 AM 06/20/2023    3:17 PM 06/18/2023    9:14 AM 05/21/2023    8:15 AM  BP/Weight  Systolic BP 143 151 146 150 120 139 130  Diastolic BP 74 85 73 78 80 87 78  Wt. (Lbs)  231  229 231.4  231  BMI  38.44 kg/m2  38.11 kg/m2 38.51 kg/m2  38.44 kg/m2           Relevant Medications   chlorthalidone  (HYGROTON ) 25 MG tablet   amLODipine  (NORVASC ) 10 MG tablet   olmesartan  (BENICAR ) 40 MG tablet   Other Relevant Orders   CMP14+EGFR     Musculoskeletal and Integument   Bilateral primary osteoarthritis of knee   Continue Flexeril  5 mg 3 times daily as needed, Tylenol  as needed        Other   Prediabetes   Lab Results  Component Value Date   HGBA1C 6.3 (A) 12/05/2022  Working on losing weight Counseled on low-carb diet      Class 2 severe obesity due to excess calories with serious comorbidity and body mass index (BMI) of 38.0 to 38.9 in adult [facility]) - Primary   Wt Readings from Last 3 Encounters:  10/11/23 231 lb (104.8 kg)  07/02/23 229 lb (103.9  kg)  06/20/23 231 lb 6.4 oz (105 kg)  Body mass index is 38.44 kg/m.  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 ,  Stated that she is not able to do intense exercises due to her chronic bilateral knee pain, she plans on doing knee surgery later this year Stated that she has been trying to eat healthy, was referred to the medical weight management clinic but she has not been seen Patient counseled on low-carb high-protein diet Encouraged to engage in regular moderate exercises as tolerated Start Wegovy  1 mg once weekly injection Follow-up in 2 months      Relevant Medications   Semaglutide -Weight Management (WEGOVY ) 1 MG/0.5ML SOAJ   Dyslipidemia   Lab Results  Component Value Date   CHOL 206 (H) 04/02/2023   HDL 46 04/02/2023   LDLCALC 136 (H) 04/02/2023   TRIG 134 04/02/2023   CHOLHDL 4.5 (H) 04/02/2023  On atorvastatin  10 mg daily Checking lipid panel      Relevant Orders   Lipid panel    Meds ordered this encounter  Medications   Semaglutide -Weight Management (WEGOVY ) 1 MG/0.5ML SOAJ    Sig: Inject 1 mg into the skin once a week.    Dispense:  2 mL    Refill:  0   chlorthalidone  (HYGROTON ) 25 MG tablet    Sig: Take 1 tablet (25 mg total) by mouth daily.    Dispense:  90 tablet    Refill:  1   amLODipine  (NORVASC ) 10 MG tablet    Sig: Take 1 tablet (10 mg total) by mouth daily.    Dispense:  90 tablet    Refill:  1    DX Code Needed  .   olmesartan  (BENICAR ) 40 MG tablet    Sig: Take 1 tablet (40 mg total) by mouth daily.    Dispense:  90 tablet    Refill:  1    Follow-up: Return in about 2 months (around 12/11/2023) for Obesity.    Eunice Oldaker R Drinda Belgard, FNP

## 2023-10-11 NOTE — Assessment & Plan Note (Addendum)
 On Amlodipine  10 mg daily, Hygroton  25 mg daily, olmesartan  40 mg daily , not sure if she has been taking hygroton  or not but states that her systolic blood pressure readings at home has been in the 120s to 130s. DASH diet and commitment to daily physical activity for a minimum of 30 minutes discussed and encouraged, as a part of hypertension management. The importance of attaining a healthy weight is also discussed.     10/11/2023   10:20 AM 10/11/2023   10:11 AM 07/02/2023    8:17 AM 07/02/2023    8:12 AM 06/20/2023    3:17 PM 06/18/2023    9:14 AM 05/21/2023    8:15 AM  BP/Weight  Systolic BP 143 151 146 150 120 139 130  Diastolic BP 74 85 73 78 80 87 78  Wt. (Lbs)  231  229 231.4  231  BMI  38.44 kg/m2  38.11 kg/m2 38.51 kg/m2  38.44 kg/m2

## 2023-10-11 NOTE — Assessment & Plan Note (Addendum)
 Lab Results  Component Value Date   HGBA1C 6.3 (A) 12/05/2022  Working on losing weight Counseled on low-carb diet

## 2023-10-12 ENCOUNTER — Ambulatory Visit: Payer: Self-pay | Admitting: Nurse Practitioner

## 2023-10-12 LAB — CMP14+EGFR
ALT: 21 IU/L (ref 0–32)
AST: 19 IU/L (ref 0–40)
Albumin: 4.5 g/dL (ref 3.8–4.9)
Alkaline Phosphatase: 82 IU/L (ref 44–121)
BUN/Creatinine Ratio: 13 (ref 9–23)
BUN: 9 mg/dL (ref 6–24)
Bilirubin Total: 0.4 mg/dL (ref 0.0–1.2)
CO2: 23 mmol/L (ref 20–29)
Calcium: 9.9 mg/dL (ref 8.7–10.2)
Chloride: 102 mmol/L (ref 96–106)
Creatinine, Ser: 0.68 mg/dL (ref 0.57–1.00)
Globulin, Total: 3 g/dL (ref 1.5–4.5)
Glucose: 100 mg/dL — ABNORMAL HIGH (ref 70–99)
Potassium: 4 mmol/L (ref 3.5–5.2)
Sodium: 140 mmol/L (ref 134–144)
Total Protein: 7.5 g/dL (ref 6.0–8.5)
eGFR: 101 mL/min/1.73 (ref >59)

## 2023-10-12 LAB — LIPID PANEL
Chol/HDL Ratio: 2.8 ratio (ref 0.0–4.4)
Cholesterol, Total: 135 mg/dL (ref 100–199)
HDL: 48 mg/dL (ref >39)
LDL Chol Calc (NIH): 73 mg/dL (ref 0–99)
Triglycerides: 68 mg/dL (ref 0–149)
VLDL Cholesterol Cal: 14 mg/dL (ref 5–40)

## 2023-11-04 ENCOUNTER — Other Ambulatory Visit: Payer: Self-pay | Admitting: Nurse Practitioner

## 2023-11-04 DIAGNOSIS — E66812 Obesity, class 2: Secondary | ICD-10-CM

## 2023-11-08 ENCOUNTER — Encounter: Payer: Self-pay | Admitting: Nurse Practitioner

## 2023-11-08 ENCOUNTER — Ambulatory Visit: Admitting: Nurse Practitioner

## 2023-11-08 VITALS — BP 127/73 | HR 65 | Temp 98.8°F | Wt 225.3 lb

## 2023-11-08 DIAGNOSIS — K115 Sialolithiasis: Secondary | ICD-10-CM | POA: Diagnosis not present

## 2023-11-08 MED ORDER — CLINDAMYCIN HCL 300 MG PO CAPS: 300.0000 mg | ORAL_CAPSULE | Freq: Three times a day (TID) | ORAL | 0 refills | Status: AC

## 2023-11-08 NOTE — Progress Notes (Signed)
 Subjective   Patient ID: Kayla Moody, female    DOB: [birth year], 58 y.o.   MRN: [id number]  Chief Complaint  Patient presents with   Mouth Lesions    Right side of her mouth has a bump on it    Referring provider: Paseda, Folashade R, FNP  Adriauna Campton is a 58 y.o. female with Past Medical History: 10/22/2016: Chronic tension-type headache, intractable 01/02/2023: Class 2 severe obesity due to excess calories with  serious comorbidity and body mass index (BMI) of 38.0 to 38.9 in  adult (HCC) 06/28/2016: Essential hypertension 12/02/2020: H. pylori duodenitis 11/30/2020: Helicobacter pylori gastritis 01/02/2023: History of hypercholesterolemia No date: Hypertension 12/05/2022: Osteoarthritis of both knees 12/05/2022: Osteoarthritis of right wrist No date: Prediabetes 11/28/2020: Symptomatic anemia   HPI  Patient presents today for an acute visit.  She states that she has developed a painful bump on the backside to the right side of her tongue.  She also has a swollen gland under the right jawline.  We discussed that this could be from salivary stone.  We will order antibiotic and discussed that patient can try sour candies to release more saliva. Denies f/c/s, n/v/d, hemoptysis, PND, leg swelling Denies chest pain or edema     Allergies  Allergen Reactions   Penicillins Anaphylaxis and Other (See Comments)    Family history of severe allergic reactions to this, so the patient does not attempt it. Tolerated amoxicillin  11/30/2020, no issues noted.      There is no immunization history on file for this patient.  Tobacco History: Social History   Tobacco Use  Smoking Status Never  Smokeless Tobacco Never   Counseling given: Not Answered   Outpatient Encounter Medications as of 11/08/2023  Medication Sig   amLODipine  (NORVASC ) 10 MG tablet Take 1 tablet (10 mg total) by mouth daily.   atorvastatin  (LIPITOR) 10 MG tablet TAKE 1 TABLET BY MOUTH EVERY DAY    chlorthalidone  (HYGROTON ) 25 MG tablet Take 1 tablet (25 mg total) by mouth daily.   clindamycin  (CLEOCIN ) 300 MG capsule Take 1 capsule (300 mg total) by mouth 3 (three) times daily for 10 days.   cyclobenzaprine  (FLEXERIL ) 5 MG tablet Take 1 tablet (5 mg total) by mouth 3 (three) times daily as needed.   olmesartan  (BENICAR ) 40 MG tablet Take 1 tablet (40 mg total) by mouth daily.   semaglutide -weight management (WEGOVY ) 1 MG/0.5ML SOAJ SQ injection INJECT 1MG  INTO THE SKIN ONCE A WEEK   meloxicam  (MOBIC ) 7.5 MG tablet Take 1 tablet (7.5 mg total) by mouth daily. (Patient not taking: Reported on 10/11/2023)   pantoprazole  (PROTONIX ) 40 MG tablet Take 1 tablet (40 mg total) by mouth daily. (Patient not taking: Reported on 10/11/2023)   No facility-administered encounter medications on file as of 11/08/2023.    Review of Systems  Review of Systems  Constitutional: Negative.   HENT: Negative.    Cardiovascular: Negative.   Gastrointestinal: Negative.   Allergic/Immunologic: Negative.   Neurological: Negative.   Psychiatric/Behavioral: Negative.       Objective:   BP 127/73   Pulse 65   Temp 98.8 F (37.1 C) (Oral)   Wt 225 lb 4.8 oz (102.2 kg)   SpO2 98%   BMI 37.49 kg/m   Wt Readings from Last 5 Encounters:  11/08/23 225 lb 4.8 oz (102.2 kg)  10/11/23 231 lb (104.8 kg)  07/02/23 229 lb (103.9 kg)  06/20/23 231 lb 6.4 oz (105 kg)  05/21/23 231 lb (104.8 kg)  Physical Exam Vitals and nursing note reviewed.  Constitutional:      General: She is not in acute distress.    Appearance: She is well-developed.  HENT:     Mouth/Throat:      Comments: Raised area to the back of the tongue. Neck:      Comments: Slight swelling noted under right jawline Cardiovascular:     Rate and Rhythm: Normal rate and regular rhythm.  Pulmonary:     Effort: Pulmonary effort is normal.     Breath sounds: Normal breath sounds.  Neurological:     Mental Status: She is alert and oriented  to person, place, and time.       Assessment & Plan:   Salivary stone -     Clindamycin  HCl; Take 1 capsule (300 mg total) by mouth 3 (three) times daily for 10 days.  Dispense: 30 capsule; Refill: 0     Return if symptoms worsen or fail to improve.   Bascom GORMAN Borer, NP 11/08/2023

## 2023-11-21 DIAGNOSIS — M170 Bilateral primary osteoarthritis of knee: Secondary | ICD-10-CM | POA: Diagnosis not present

## 2023-11-26 ENCOUNTER — Ambulatory Visit: Payer: Self-pay | Admitting: *Deleted

## 2023-11-26 NOTE — Telephone Encounter (Signed)
 FYI Only or Action Required?: Action required by provider: request for appointment, medication refill request, and clinical question for provider.  Patient was last seen in primary care on 11/08/2023 by Oley Bascom RAMAN, NP.  Called Nurse Triage reporting Dizziness.  Symptoms began several days ago.  Interventions attempted: Rest, hydration, or home remedies.  Symptoms are: gradually worsening.  Triage Disposition: See PCP When Office is Open (Within 3 Days)  Patient/caregiver understands and will follow disposition?: No, wishes to speak with PCP             Copied from CRM #[id number]. Topic: Clinical - Red Word Triage >> Nov 26, 2023  1:49 PM Ivette P wrote: [facility] that prompted transfer to Nurse Triage: pt having high blood sugar pass few days, pt requesting refill on metfomin, Reason for Disposition  [1] MODERATE dizziness (e.g., interferes with normal activities) AND [2] has been evaluated by doctor (or NP/PA) for this  Answer Assessment - Initial Assessment Questions No available appt tomorrow. Recommended UC/ED if sx worsen. Patient called in requesting metformin  refill. Medication not on med list. Denies dizziness now.  Hx bleeding ulcers, HTN, DM. Please advise   Patient reports she drinks plenty of water and no dizziness now.         1. DESCRIPTION: Describe your dizziness.     lightheaded 2. LIGHTHEADED: Do you feel lightheaded? (e.g., somewhat faint, woozy, weak upon standing)     Dizziness upon standing  3. VERTIGO: Do you feel like either you or the room is spinning or tilting? (i.e., vertigo)     no 4. SEVERITY: How bad is it?  Do you feel like you are going to faint? Can you stand and walk?     Denies dizziness today but reports has been feeling dizzy 5. ONSET:  When did the dizziness begin?     3 days ago  6. AGGRAVATING FACTORS: Does anything make it worse? (e.g., standing, change in head position)     Standing  7. HEART RATE:  Can you tell me your heart rate? How many beats in 15 seconds?  (Note: Not all patients can do this.)       Na  8. CAUSE: What do you think is causing the dizziness? (e.g., decreased fluids or food, diarrhea, emotional distress, heat exposure, new medicine, sudden standing, vomiting; unknown)     Not sure possible BP elevated, hx, bleeding ulcers, BS 9. RECURRENT SYMPTOM: Have you had dizziness before? If Yes, ask: When was the last time? What happened that time?     Na  10. OTHER SYMPTOMS: Do you have any other symptoms? (e.g., fever, chest pain, vomiting, diarrhea, bleeding)       Diarrhea, no blood in stool.  BP 147/94  rechecked BP 141/80. Blood sugar this am 125 and 135 after eating today lunch.  11. PREGNANCY: Is there any chance you are pregnant? When was your last menstrual period?       na  Protocols used: Dizziness - Lightheadedness-A-AH  Reason for Disposition  [1] MODERATE dizziness (e.g., interferes with normal activities) AND [2] has been evaluated by doctor (or NP/PA) for this  Answer Assessment - Initial Assessment Questions 1. DESCRIPTION: Describe your dizziness.     lightheaded 2. LIGHTHEADED: Do you feel lightheaded? (e.g., somewhat faint, woozy, weak upon standing)     Dizziness upon standing  3. VERTIGO: Do you feel like either you or the room is spinning or tilting? (i.e., vertigo)     *No Answer*  4. SEVERITY: How bad is it?  Do you feel like you are going to faint? Can you stand and walk?     *No Answer* 5. ONSET:  When did the dizziness begin?     *No Answer* 6. AGGRAVATING FACTORS: Does anything make it worse? (e.g., standing, change in head position)     *No Answer* 7. HEART RATE: Can you tell me your heart rate? How many beats in 15 seconds?  (Note: Not all patients can do this.)       *No Answer* 8. CAUSE: What do you think is causing the dizziness? (e.g., decreased fluids or food, diarrhea, emotional distress, heat  exposure, new medicine, sudden standing, vomiting; unknown)     Not sure possible BP elevated 9. RECURRENT SYMPTOM: Have you had dizziness before? If Yes, ask: When was the last time? What happened that time?     Na  10. OTHER SYMPTOMS: Do you have any other symptoms? (e.g., fever, chest pain, vomiting, diarrhea, bleeding)       Diarrhea, no blood in stool.  BP 147/94 rec 11. PREGNANCY: Is there any chance you are pregnant? When was your last menstrual period?       na  Protocols used: Dizziness - Lightheadedness-A-AH  Reason for Disposition  [1] MODERATE dizziness (e.g., interferes with normal activities) AND [2] has been evaluated by doctor (or NP/PA) for this  Answer Assessment - Initial Assessment Questions No available appt tomorrow. Recommended UC/ED if sx worsen. Patient called in requesting metformin  refill. Medication not on med list. Denies dizziness now.  Hx bleeding ulcers, HTN, DM. Please advise            1. DESCRIPTION: Describe your dizziness.     lightheaded 2. LIGHTHEADED: Do you feel lightheaded? (e.g., somewhat faint, woozy, weak upon standing)     Dizziness upon standing  3. VERTIGO: Do you feel like either you or the room is spinning or tilting? (i.e., vertigo)     no 4. SEVERITY: How bad is it?  Do you feel like you are going to faint? Can you stand and walk?     Denies dizziness today but reports has been feeling dizzy 5. ONSET:  When did the dizziness begin?     3 days ago  6. AGGRAVATING FACTORS: Does anything make it worse? (e.g., standing, change in head position)     Standing  7. HEART RATE: Can you tell me your heart rate? How many beats in 15 seconds?  (Note: Not all patients can do this.)       Na  8. CAUSE: What do you think is causing the dizziness? (e.g., decreased fluids or food, diarrhea, emotional distress, heat exposure, new medicine, sudden standing, vomiting; unknown)     Not sure possible BP elevated,  hx, bleeding ulcers, BS 9. RECURRENT SYMPTOM: Have you had dizziness before? If Yes, ask: When was the last time? What happened that time?     Na  10. OTHER SYMPTOMS: Do you have any other symptoms? (e.g., fever, chest pain, vomiting, diarrhea, bleeding)       Diarrhea, no blood in stool.  BP 147/94  rechecked BP 141/80. Blood sugar this am 125 and 135 after eating today lunch.  11. PREGNANCY: Is there any chance you are pregnant? When was your last menstrual period?       na  Protocols used: Dizziness - Lightheadedness-A-AH

## 2023-12-02 ENCOUNTER — Telehealth: Payer: Self-pay

## 2023-12-02 ENCOUNTER — Other Ambulatory Visit: Payer: Self-pay | Admitting: Nurse Practitioner

## 2023-12-02 DIAGNOSIS — R7303 Prediabetes: Secondary | ICD-10-CM

## 2023-12-02 MED ORDER — METFORMIN HCL ER 500 MG PO TB24: 500.0000 mg | ORAL_TABLET | Freq: Every day | ORAL | 3 refills | Status: DC

## 2023-12-02 NOTE — Telephone Encounter (Signed)
 [facility] sent a rx request for metformin . Pt told them her sugars are up and she needs a new script. Please advise.

## 2023-12-11 ENCOUNTER — Telehealth: Payer: Self-pay

## 2023-12-11 ENCOUNTER — Ambulatory Visit: Payer: Self-pay | Admitting: Nurse Practitioner

## 2023-12-11 ENCOUNTER — Encounter: Payer: Self-pay | Admitting: Nurse Practitioner

## 2023-12-11 VITALS — BP 115/77 | HR 79 | Wt 217.0 lb

## 2023-12-11 DIAGNOSIS — R7303 Prediabetes: Secondary | ICD-10-CM | POA: Diagnosis not present

## 2023-12-11 DIAGNOSIS — M170 Bilateral primary osteoarthritis of knee: Secondary | ICD-10-CM | POA: Diagnosis not present

## 2023-12-11 DIAGNOSIS — I10 Essential (primary) hypertension: Secondary | ICD-10-CM

## 2023-12-11 DIAGNOSIS — Z0000 Encounter for general adult medical examination without abnormal findings: Secondary | ICD-10-CM | POA: Diagnosis not present

## 2023-12-11 DIAGNOSIS — E66812 Obesity, class 2: Secondary | ICD-10-CM | POA: Diagnosis not present

## 2023-12-11 DIAGNOSIS — Z6838 Body mass index (BMI) 38.0-38.9, adult: Secondary | ICD-10-CM

## 2023-12-11 MED ORDER — SEMAGLUTIDE-WEIGHT MANAGEMENT 1.7 MG/0.75ML ~~LOC~~ SOAJ: 1.7000 mg | SUBCUTANEOUS | 0 refills | Status: DC

## 2023-12-11 NOTE — Progress Notes (Signed)
 Established Patient Office Visit  Subjective:  Patient ID: Kayla Moody, female    DOB: Dec 29, 1965  Age: 58 y.o. MRN: [id number]  CC:  Chief Complaint  Patient presents with   Obesity    HPI    Discussed the use of AI scribe software for clinical note transcription with the patient, who gave verbal consent to proceed.  History of Present Illness Kayla Moody is a 58 year old female  has a past medical history of Chronic tension-type headache, intractable (10/22/2016), Class 2 severe obesity due to excess calories with serious comorbidity and body mass index (BMI) of 38.0 to 38.9 in adult (01/02/2023), Essential hypertension (06/28/2016), H. pylori duodenitis (12/02/2020), Helicobacter pylori gastritis (11/30/2020), History of hypercholesterolemia (01/02/2023), Hypertension, Osteoarthritis of both knees (12/05/2022), Osteoarthritis of right wrist (12/05/2022), Prediabetes, and Symptomatic anemia (11/28/2020).  who presents for a weight management follow-up.  She is currently on a weight management program and has been using Wegovy , with her last dose of 1 mg taken on Friday. She has lost approximately 14 pounds since starting the wegovy , with a recent loss of 8 pounds since her last visit in August. Her diet has been significantly modified, fasting for 14 hours a day, and consuming mainly water, green tea, black tea, coffee, chicken, fish, spinach, and other vegetables. She eats two meals a day, typically around 12:30 PM and 5 PM. She also engages in physical activity by walking and using a walking pad and a vibrating machine at home.  She has a history of prediabetes was taking of metformin  when she started taking Wegovy  but has recently asked to restart metformin , although she has not yet taken it. Her blood sugar levels have been monitored, with recent readings of 102 and 95, but one instance of 135. She was previously advised to stop metformin  when starting Wegovy  to prevent  hypoglycemia.  Her hypertension is managed with amlodipine  10 mg daily, chlorthalidone  25 mg daily, and olmesartan  40 mg daily. Her most recent blood pressure reading was 115/77.  She experiences knee pain due to arthritis, for which she received a cortisone shot two weeks ago. She has not needed to take Tylenol  recently, although she notes increased pain with colder weather.  No nausea, vomiting, abdominal pain, fever, chills, chest pain, or shortness of breath. No new medication allergies.  Assessment & Plan       Past Medical History:  Diagnosis Date   Chronic tension-type headache, intractable 10/22/2016   Class 2 severe obesity due to excess calories with serious comorbidity and body mass index (BMI) of 38.0 to 38.9 in adult 01/02/2023   Essential hypertension 06/28/2016   H. pylori duodenitis 12/02/2020   Helicobacter pylori gastritis 11/30/2020   History of hypercholesterolemia 01/02/2023   Hypertension    Osteoarthritis of both knees 12/05/2022   Osteoarthritis of right wrist 12/05/2022   Prediabetes    Symptomatic anemia 11/28/2020    Past Surgical History:  Procedure Laterality Date   BIOPSY  11/29/2020   Procedure: BIOPSY;  Surgeon: Avram Lupita BRAVO, MD;  Location: [facility] ENDOSCOPY;  Service: Endoscopy;;   CHOLECYSTECTOMY     ESOPHAGOGASTRODUODENOSCOPY (EGD) WITH PROPOFOL  N/A 11/29/2020   Procedure: ESOPHAGOGASTRODUODENOSCOPY (EGD) WITH PROPOFOL ;  Surgeon: Avram Lupita BRAVO, MD;  Location: [facility] ENDOSCOPY;  Service: Endoscopy;  Laterality: N/A;   HERNIA REPAIR      Family History  Problem Relation Age of Onset   Diabetes Mother    Hypertension Mother    Colon polyps Neg Hx  Colon cancer Neg Hx    Stomach cancer Neg Hx    Esophageal cancer Neg Hx    Rectal cancer Neg Hx     Social History   Socioeconomic History   Marital status: Widowed    Spouse name: Not on file   Number of children: 2   Years of education: Not on file   Highest education level: Associate  degree: academic program  Occupational History   Not on file  Tobacco Use   Smoking status: Never   Smokeless tobacco: Never  Vaping Use   Vaping status: Never Used  Substance and Sexual Activity   Alcohol use: No    Alcohol/week: 0.0 standard drinks of alcohol   Drug use: No   Sexual activity: Not Currently  Other Topics Concern   Not on file  Social History Narrative   Lives home alone.    Social Drivers of Corporate investment banker Strain: Low Risk  (10/09/2023)   Overall Financial Resource Strain (CARDIA)    Difficulty of Paying Living Expenses: Not very hard  Food Insecurity: No Food Insecurity (10/09/2023)   Hunger Vital Sign    Worried About Running Out of Food in the Last Year: Never true    Ran Out of Food in the Last Year: Never true  Transportation Needs: No Transportation Needs (10/09/2023)   PRAPARE - Administrator, Civil Service (Medical): No    Lack of Transportation (Non-Medical): No  Physical Activity: Insufficiently Active (10/09/2023)   Exercise Vital Sign    Days of Exercise per Week: 2 days    Minutes of Exercise per Session: 30 min  Stress: No Stress Concern Present (10/09/2023)   [employer] of Occupational Health - Occupational Stress Questionnaire    Feeling of Stress: Not at all  Social Connections: Socially Isolated (10/09/2023)   Social Connection and Isolation Panel    Frequency of Communication with Friends and Family: Twice a week    Frequency of Social Gatherings with Friends and Family: Never    Attends Religious Services: Never    Database administrator or Organizations: No    Attends Engineer, structural: Not on file    Marital Status: Widowed  Intimate Partner Violence: Low Risk  (08/08/2020)   Received from [facility]   Intimate Partner Violence    Insults You: Not on file    Threatens You: Not on file    Screams at You: Not on file    Physically Hurt: Not on file    Intimate Partner Violence Score:  Not on file    Outpatient Medications Prior to Visit  Medication Sig Dispense Refill   amLODipine  (NORVASC ) 10 MG tablet Take 1 tablet (10 mg total) by mouth daily. 90 tablet 1   atorvastatin  (LIPITOR) 10 MG tablet TAKE 1 TABLET BY MOUTH EVERY DAY 90 tablet 1   chlorthalidone  (HYGROTON ) 25 MG tablet Take 1 tablet (25 mg total) by mouth daily. 90 tablet 1   cyclobenzaprine  (FLEXERIL ) 5 MG tablet Take 1 tablet (5 mg total) by mouth 3 (three) times daily as needed. 40 tablet 1   olmesartan  (BENICAR ) 40 MG tablet Take 1 tablet (40 mg total) by mouth daily. 90 tablet 1   semaglutide -weight management (WEGOVY ) 1 MG/0.5ML SOAJ SQ injection INJECT 1MG  INTO THE SKIN ONCE A WEEK 0.5 mL 1   meloxicam  (MOBIC ) 7.5 MG tablet Take 1 tablet (7.5 mg total) by mouth daily. (Patient not taking: Reported on 10/11/2023) 30  tablet 0   metFORMIN  (GLUCOPHAGE -XR) 500 MG 24 hr tablet Take 1 tablet (500 mg total) by mouth daily with breakfast. (Patient not taking: Reported on 12/11/2023) 30 tablet 3   pantoprazole  (PROTONIX ) 40 MG tablet Take 1 tablet (40 mg total) by mouth daily. (Patient not taking: Reported on 10/11/2023) 30 tablet 3   No facility-administered medications prior to visit.    Allergies  Allergen Reactions   Penicillins Anaphylaxis and Other (See Comments)    Family history of severe allergic reactions to this, so the patient does not attempt it. Tolerated amoxicillin  11/30/2020, no issues noted.     ROS Review of Systems  Constitutional:  Negative for appetite change, chills, fatigue and fever.  HENT:  Negative for congestion, postnasal drip, rhinorrhea and sneezing.   Respiratory:  Negative for cough, shortness of breath and wheezing.   Cardiovascular:  Negative for chest pain, palpitations and leg swelling.  Gastrointestinal:  Negative for abdominal pain, constipation, nausea and vomiting.  Genitourinary:  Negative for difficulty urinating, dysuria, flank pain and frequency.  Musculoskeletal:   Negative for back pain, joint swelling and myalgias.  Skin:  Negative for color change, pallor, rash and wound.  Neurological:  Negative for dizziness, facial asymmetry, weakness, numbness and headaches.  Psychiatric/Behavioral:  Negative for behavioral problems, confusion, self-injury and suicidal ideas.       Objective:    Physical Exam Vitals and nursing note reviewed.  Constitutional:      General: She is not in acute distress.    Appearance: Normal appearance. She is not ill-appearing, toxic-appearing or diaphoretic.  Eyes:     General: No scleral icterus.       Right eye: No discharge.        Left eye: No discharge.     Extraocular Movements: Extraocular movements intact.     Conjunctiva/sclera: Conjunctivae normal.  Cardiovascular:     Rate and Rhythm: Normal rate and regular rhythm.     Pulses: Normal pulses.     Heart sounds: Normal heart sounds. No murmur heard.    No friction rub. No gallop.  Pulmonary:     Effort: Pulmonary effort is normal. No respiratory distress.     Breath sounds: Normal breath sounds. No stridor. No wheezing, rhonchi or rales.  Chest:     Chest wall: No tenderness.  Abdominal:     General: There is no distension.     Palpations: Abdomen is soft.     Tenderness: There is no abdominal tenderness. There is no right CVA tenderness, left CVA tenderness or guarding.  Musculoskeletal:        General: No deformity or signs of injury.     Right lower leg: No edema.     Left lower leg: No edema.  Skin:    General: Skin is warm and dry.     Capillary Refill: Capillary refill takes less than 2 seconds.     Coloration: Skin is not jaundiced or pale.     Findings: No bruising, erythema or lesion.  Neurological:     Mental Status: She is alert and oriented to person, place, and time.     Motor: No weakness.     Coordination: Coordination normal.     Gait: Gait normal.  Psychiatric:        Mood and Affect: Mood normal.        Behavior: Behavior  normal.        Thought Content: Thought content normal.  Judgment: Judgment normal.     BP 115/77   Pulse 79   Wt 217 lb (98.4 kg)   SpO2 100%   BMI 36.11 kg/m  Wt Readings from Last 3 Encounters:  12/11/23 217 lb (98.4 kg)  11/08/23 225 lb 4.8 oz (102.2 kg)  10/11/23 231 lb (104.8 kg)    No results found for: TSH Lab Results  Component Value Date   WBC 6.0 04/02/2023   HGB 14.5 04/02/2023   HCT 42.7 04/02/2023   MCV 89 04/02/2023   PLT 421 04/02/2023   Lab Results  Component Value Date   NA 140 10/11/2023   K 4.0 10/11/2023   CO2 23 10/11/2023   GLUCOSE 100 (H) 10/11/2023   BUN 9 10/11/2023   CREATININE 0.68 10/11/2023   BILITOT 0.4 10/11/2023   ALKPHOS 82 10/11/2023   AST 19 10/11/2023   ALT 21 10/11/2023   PROT 7.5 10/11/2023   ALBUMIN 4.5 10/11/2023   CALCIUM  9.9 10/11/2023   ANIONGAP 9 12/15/2022   EGFR 101 10/11/2023   Lab Results  Component Value Date   CHOL 135 10/11/2023   Lab Results  Component Value Date   HDL 48 10/11/2023   Lab Results  Component Value Date   LDLCALC 73 10/11/2023   Lab Results  Component Value Date   TRIG 68 10/11/2023   Lab Results  Component Value Date   CHOLHDL 2.8 10/11/2023   Lab Results  Component Value Date   HGBA1C 6.3 (A) 12/05/2022      Assessment & Plan:   Problem List Items Addressed This Visit       Cardiovascular and Mediastinum   Essential hypertension   BP Readings from Last 3 Encounters:  12/11/23 115/77  11/08/23 127/73  10/11/23 (!) 143/74    Essential hypertension Blood pressure well-controlled at 115/77 mmHg on current regimen. - Continue amlodipine  10 mg daily, chlorthalidone  25 mg daily, olmesartan  40 mg  Encouraged DASH diet and dietary sodium restrictions Continue to increase dietary efforts and exercise.           Musculoskeletal and Integument   Bilateral primary osteoarthritis of knee   Osteoarthritis of both knees Knee pain managed with cortisone injection.  Considering knee replacement surgery post-Christmas. - Consider knee replacement surgery consultation after Christmas. - Encourage weight loss to reduce knee pain and improve surgical outcomes.         Other   Prediabetes   Lab Results  Component Value Date   HGBA1C 6.3 (A) 12/05/2022   Prediabetes Resumed metformin  but not yet started. Blood sugar generally normal with occasional elevations. - Consider starting metformin  500 mg daily with breakfast if blood sugar levels increase. - Continue monitoring blood sugar levels regularly.      Class 2 severe obesity due to excess calories with serious comorbidity and body mass index (BMI) of 38.0 to 38.9 in adult - Primary   Wt Readings from Last 3 Encounters:  12/11/23 217 lb (98.4 kg)  11/08/23 225 lb 4.8 oz (102.2 kg)  10/11/23 231 lb (104.8 kg)   Body mass index is 36.11 kg/m.   She lost 8 pounds since August, now 217 pounds. Follows a high-protein, vegetable-rich diet, fasts 14 hours daily, and exercises. Concern about Medicaid coverage for Wegovy . - Continue semaglutide  (Wegovy ) 1.7 mg weekly, pending Medicaid approval. - Discuss potential switch to Contrave if Wegovy  is not covered, noting side effects. - Encourage continued dietary modifications with high protein and vegetable intake. - Encourage regular physical  activity, including walking and weightlifting as tolerated at least 150 minutes weekly       Relevant Medications   semaglutide -weight management (WEGOVY ) 1.7 MG/0.75ML SOAJ SQ injection   Health care maintenance    Has not received flu, shingles, pneumonia, or Tdap vaccines. Hesitant about flu shot. - Encourage consideration of flu, shingles, pneumonia, and Tdap vaccines.       Meds ordered this encounter  Medications   semaglutide -weight management (WEGOVY ) 1.7 MG/0.75ML SOAJ SQ injection    Sig: Inject 1.7 mg into the skin once a week.    Dispense:  3 mL    Refill:  0    Follow-up: Return in about 3  months (around 03/12/2024), or obesity.    Kayron Hicklin R Mikaiya Tramble, FNP

## 2023-12-11 NOTE — Assessment & Plan Note (Addendum)
  Has not received flu, shingles, pneumonia, or Tdap vaccines. Hesitant about flu shot. - Encourage consideration of flu, shingles, pneumonia, and Tdap vaccines.

## 2023-12-11 NOTE — Patient Instructions (Signed)
 1. Class 2 severe obesity due to excess calories with serious comorbidity and body mass index (BMI) of 38.0 to 38.9 in adult (Primary)  - semaglutide -weight management (WEGOVY ) 1.7 MG/0.75ML SOAJ SQ injection; Inject 1.7 mg into the skin once a week.  Dispense: 3 mL; Refill: 0    It is important that you exercise regularly at least 30 minutes 5 times a week as tolerated  Think about what you will eat, plan ahead. Choose  clean, green, fresh or frozen over canned, processed or packaged foods which are more sugary, salty and fatty. 70 to 75% of food eaten should be vegetables and fruit. Three meals at set times with snacks allowed between meals, but they must be fruit or vegetables. Aim to eat over a 12 hour period , example 7 am to 7 pm, and STOP after  your last meal of the day. Drink water,generally about 64 ounces per day, no other drink is as healthy. Fruit juice is best enjoyed in a healthy way, by EATING the fruit.  Thanks for choosing [facility] we consider it a privelige to serve you.

## 2023-12-11 NOTE — Assessment & Plan Note (Addendum)
 Wt Readings from Last 3 Encounters:  12/11/23 217 lb (98.4 kg)  11/08/23 225 lb 4.8 oz (102.2 kg)  10/11/23 231 lb (104.8 kg)   Body mass index is 36.11 kg/m.   She lost 8 pounds since August, now 217 pounds. Follows a high-protein, vegetable-rich diet, fasts 14 hours daily, and exercises. Concern about Medicaid coverage for Wegovy . - Continue semaglutide  (Wegovy ) 1.7 mg weekly, pending Medicaid approval. - Discuss potential switch to Contrave if Wegovy  is not covered, noting side effects. - Encourage continued dietary modifications with high protein and vegetable intake. - Encourage regular physical activity, including walking and weightlifting as tolerated at least 150 minutes weekly

## 2023-12-11 NOTE — Assessment & Plan Note (Signed)
 Osteoarthritis of both knees Knee pain managed with cortisone injection. Considering knee replacement surgery post-Christmas. - Consider knee replacement surgery consultation after Christmas. - Encourage weight loss to reduce knee pain and improve surgical outcomes.

## 2023-12-11 NOTE — Assessment & Plan Note (Signed)
 Lab Results  Component Value Date   HGBA1C 6.3 (A) 12/05/2022   Prediabetes Resumed metformin  but not yet started. Blood sugar generally normal with occasional elevations. - Consider starting metformin  500 mg daily with breakfast if blood sugar levels increase. - Continue monitoring blood sugar levels regularly.

## 2023-12-11 NOTE — Assessment & Plan Note (Signed)
 BP Readings from Last 3 Encounters:  12/11/23 115/77  11/08/23 127/73  10/11/23 (!) 143/74    Essential hypertension Blood pressure well-controlled at 115/77 mmHg on current regimen. - Continue amlodipine  10 mg daily, chlorthalidone  25 mg daily, olmesartan  40 mg  Encouraged DASH diet and dietary sodium restrictions Continue to increase dietary efforts and exercise.

## 2023-12-11 NOTE — Addendum Note (Signed)
 Addended by: JUANICE THOMES SAUNDERS on: 12/11/2023 04:25 PM   Modules accepted: Level of Service

## 2023-12-11 NOTE — Telephone Encounter (Signed)
 Copied from CRM #[id number]. Topic: Clinical - Prescription Issue >> Dec 11, 2023  2:05 PM Fonda T wrote: Reason for CRM: Received call from patient, states she had an appointment with PCP today.  Patient reports, per inusrance, advised patient that Wegovy  is no longer covered for weight loss, and needs a new script for another reason for patient taking medication.   Patient states the reason she is also on Wegovy  is because she was informed that she is pre-diabetic.   Per patient prescription will need to be re-written with reason updated of why she is taking Wegovy . Or wither a different to take by mouth.  Patient is requesting a follow up call at [phone].   [id number] - [place],  - [address] AT CORNER OF [facility] [address] [place] [address] Phone: [phone] Fax: [phone]

## 2023-12-12 ENCOUNTER — Other Ambulatory Visit: Payer: Self-pay

## 2023-12-13 ENCOUNTER — Encounter: Payer: Self-pay | Admitting: Nurse Practitioner

## 2023-12-13 ENCOUNTER — Telehealth (INDEPENDENT_AMBULATORY_CARE_PROVIDER_SITE_OTHER): Admitting: Nurse Practitioner

## 2023-12-13 VITALS — Ht 65.0 in | Wt 217.0 lb

## 2023-12-13 DIAGNOSIS — Z6838 Body mass index (BMI) 38.0-38.9, adult: Secondary | ICD-10-CM | POA: Diagnosis not present

## 2023-12-13 DIAGNOSIS — E66812 Obesity, class 2: Secondary | ICD-10-CM | POA: Diagnosis not present

## 2023-12-13 MED ORDER — PHENTERMINE HCL 15 MG PO CAPS: 15.0000 mg | ORAL_CAPSULE | ORAL | 0 refills | Status: DC

## 2023-12-13 NOTE — Progress Notes (Signed)
 Virtual Visit via video Note  I connected with Kayla Moody @ on 12/13/23 at 1320pm by video and verified that I am speaking with the correct person using two identifiers. I sepnt 7 minutes on this video encounter   Location: Patient: home Provider: office   I discussed the limitations, risks, security and privacy concerns of performing an evaluation and management service by telephone and the availability of in person appointments. I also discussed with the patient that there may be a patient responsible charge related to this service. The patient expressed understanding and agreed to proceed.    Discussed the use of AI scribe software for clinical note transcription with the patient, who gave verbal consent to proceed.  History of Present Illness Kayla Moody is a 58 year old female  has a past medical history of Chronic tension-type headache, intractable (10/22/2016), Class 2 severe obesity due to excess calories with serious comorbidity and body mass index (BMI) of 38.0 to 38.9 in adult (01/02/2023), Essential hypertension (06/28/2016), H. pylori duodenitis (12/02/2020), Helicobacter pylori gastritis (11/30/2020), History of hypercholesterolemia (01/02/2023), Hypertension, Osteoarthritis of both knees (12/05/2022), Osteoarthritis of right wrist (12/05/2022), Prediabetes, and Symptomatic anemia (11/28/2020). who presents with concerns about weight management medication coverage.  She is concerned that her insurance no longer covers Wegovy  for weight loss, as it is now only covered for conditions like stroke and diabetes. She completed her last dose of Wegovy  last Friday.  She has not previously used phentermine, a medication that her insurance will cover.  She wants to take medication that will assist in weight loss.     Observations/Objective: Patient alert and oriented no sign of distress noted  Assessment and Plan: Assessment & Plan Class 2 severe obesity due to excess calories  with serious comorbidity and body mass index (BMI) of 38.0 to 38.9 in adult Wt Readings from Last 3 Encounters:  12/13/23 217 lb (98.4 kg)  12/11/23 217 lb (98.4 kg)  11/08/23 225 lb 4.8 oz (102.2 kg)   Body mass index is 36.11 kg/m.   Obesity management is complicated by insurance no longer covering Wegovy . Phentermine considered as an alternative . Emphasized lifestyle modifications for sustained weight loss. Discussed potential weight gain after stopping medications. Contrave mentioned as a long-term option if covered by insurance. - Prescribe phentermine 15 mg daily will increase dose to 37.5 mg daily after 4 weeks if well-tolerated and continue for 3 to 6 months. - Advise monitoring blood pressure at home and report any increase or palpitations. - Encourage 30 minutes of moderate exercise 5 days a week. - Advise dietary modifications with 70-80% of meals consisting of vegetables and protein, focusing on portion control. - Schedule follow-up appointment in one month to assess response to phentermine.       Follow Up Instructions:    I discussed the assessment and treatment plan with the patient. The patient was provided an opportunity to ask questions and all were answered. The patient agreed with the plan and demonstrated an understanding of the instructions.   The patient was advised to call back or seek an in-person evaluation if the symptoms worsen or if the condition fails to improve as anticipated.

## 2023-12-13 NOTE — Telephone Encounter (Signed)
 Pt had appt. Today. Kayla Moody

## 2023-12-13 NOTE — Assessment & Plan Note (Signed)
 Wt Readings from Last 3 Encounters:  12/13/23 217 lb (98.4 kg)  12/11/23 217 lb (98.4 kg)  11/08/23 225 lb 4.8 oz (102.2 kg)   Body mass index is 36.11 kg/m.   Obesity management is complicated by insurance no longer covering Wegovy . Phentermine considered as an alternative . Emphasized lifestyle modifications for sustained weight loss. Discussed potential weight gain after stopping medications. Contrave mentioned as a long-term option if covered by insurance. - Prescribe phentermine 15 mg daily will increase dose to 37.5 mg daily after 4 weeks if well-tolerated and continue for 3 to 6 months. - Advise monitoring blood pressure at home and report any increase or palpitations. - Encourage 30 minutes of moderate exercise 5 days a week. - Advise dietary modifications with 70-80% of meals consisting of vegetables and protein, focusing on portion control. - Schedule follow-up appointment in one month to assess response to phentermine.

## 2024-02-03 ENCOUNTER — Other Ambulatory Visit: Payer: Self-pay | Admitting: Nurse Practitioner

## 2024-02-03 DIAGNOSIS — E66812 Obesity, class 2: Secondary | ICD-10-CM

## 2024-02-04 ENCOUNTER — Other Ambulatory Visit: Payer: Self-pay | Admitting: Nurse Practitioner

## 2024-02-04 NOTE — Telephone Encounter (Signed)
 Please advise [facility]

## 2024-03-18 ENCOUNTER — Ambulatory Visit (INDEPENDENT_AMBULATORY_CARE_PROVIDER_SITE_OTHER): Payer: Self-pay | Admitting: Nurse Practitioner

## 2024-03-18 ENCOUNTER — Encounter: Payer: Self-pay | Admitting: Nurse Practitioner

## 2024-03-18 VITALS — BP 131/62 | HR 61 | Wt 226.0 lb

## 2024-03-18 DIAGNOSIS — I10 Essential (primary) hypertension: Secondary | ICD-10-CM | POA: Diagnosis not present

## 2024-03-18 DIAGNOSIS — M170 Bilateral primary osteoarthritis of knee: Secondary | ICD-10-CM | POA: Diagnosis not present

## 2024-03-18 DIAGNOSIS — R7303 Prediabetes: Secondary | ICD-10-CM | POA: Diagnosis not present

## 2024-03-18 DIAGNOSIS — M79675 Pain in left toe(s): Secondary | ICD-10-CM | POA: Insufficient documentation

## 2024-03-18 DIAGNOSIS — E785 Hyperlipidemia, unspecified: Secondary | ICD-10-CM

## 2024-03-18 DIAGNOSIS — Z1231 Encounter for screening mammogram for malignant neoplasm of breast: Secondary | ICD-10-CM | POA: Diagnosis not present

## 2024-03-18 NOTE — Patient Instructions (Signed)
 1. Essential hypertension (Primary) - CMP14+EGFR  2. Prediabetes - Hemoglobin A1c  3. Screening mammogram for breast cancer - MM 3D SCREENING MAMMOGRAM BILATERAL BREAST; Future  4. Morbid obesity (HCC) - Amb Ref to Medical Weight Management  5. Great toe pain, left - Uric Acid      It is important that you exercise regularly at least 30 minutes 5 times a week as tolerated  Think about what you will eat, plan ahead. Choose  clean, green, fresh or frozen over canned, processed or packaged foods which are more sugary, salty and fatty. 70 to 75% of food eaten should be vegetables and fruit. Three meals at set times with snacks allowed between meals, but they must be fruit or vegetables. Aim to eat over a 12 hour period , example 7 am to 7 pm, and STOP after  your last meal of the day. Drink water,generally about 64 ounces per day, no other drink is as healthy. Fruit juice is best enjoyed in a healthy way, by EATING the fruit.  Thanks for choosing [facility] we consider it a privelige to serve you.

## 2024-03-18 NOTE — Assessment & Plan Note (Signed)
.   Pain level 4-5/10. - Continue Celebrex 200 mg once daily for knee pain. - Advised wearing comfortable shoes. - Recommended Tylenol  650 mg every 6 hours as needed, avoid NSAIDs. - Ordered uric acid level to rule out gout. - Follow up with orthopedic doctor if no improvement.

## 2024-03-18 NOTE — Assessment & Plan Note (Signed)
 Continue Celebrex 200 mg daily and Tylenol  arthritis Followed by orthopedics

## 2024-03-18 NOTE — Progress Notes (Signed)
 "  Established Patient Office Visit  Subjective:  Patient ID: Kayla Moody, female    DOB: Apr 29, 1965  Age: 59 y.o. MRN: [id number]  CC:  Chief Complaint  Patient presents with   Medical Management of Chronic Issues    weight    HPI   Discussed the use of AI scribe software for clinical note transcription with the patient, who gave verbal consent to proceed.  History of Present Illness Kayla Moody is a 59 year old female  has a past medical history of Chronic tension-type headache, intractable (10/22/2016), Class 2 severe obesity due to excess calories with serious comorbidity and body mass index (BMI) of 38.0 to 38.9 in adult (01/02/2023), Essential hypertension (06/28/2016), H. pylori duodenitis (12/02/2020), Helicobacter pylori gastritis (11/30/2020), History of hypercholesterolemia (01/02/2023), Hypertension, Osteoarthritis of both knees (12/05/2022), Osteoarthritis of right wrist (12/05/2022), Prediabetes, and Symptomatic anemia (11/28/2020).  Patient presents for follow-up for her chronic medical conditions  She experiences sharp pain in her left big toe, which has been worsening over time. The pain occurs even when sitting, with a recent episode just before the visit. There is no associated redness or swelling. The pain feels better when she wears slippers or Crocs compared to normal shoes. She initially thought the pain was due to her shoes, but they are a size too big, ruling out tight footwear as a cause.  She has a history of arthritis affecting  her knees. She is currently taking Celebrex 200 mg once daily for her knee arthritis and also uses Tylenol  for arthritis pain. The pain in her toe is not typically this severe, rating it as a 4 or 5 out of 10 during the visit.  She has a history of hypertension and hyperlipidemia, for which she takes amlodipine  10 mg daily Benicar  40 mg daily,  and chlorthalidone  25 mg daily  She has been trying to manage her weight, having gained  approximately 9 pounds over the holiday season. She has a walking treadmill at home but finds it difficult to exercise due to knee pain. Her diet consists of high-protein meals delivered by a service, focusing on salmon, chicken, and vegetables, with no rice or carbs. She avoids soda and juice and typically eats cottage cheese with berries for breakfast.  She stopped taking phentermine  due to headache  No chest pain or shortness of breath or edema   Assessment & Plan .    Past Medical History:  Diagnosis Date   Chronic tension-type headache, intractable 10/22/2016   Class 2 severe obesity due to excess calories with serious comorbidity and body mass index (BMI) of 38.0 to 38.9 in adult 01/02/2023   Essential hypertension 06/28/2016   H. pylori duodenitis 12/02/2020   Helicobacter pylori gastritis 11/30/2020   History of hypercholesterolemia 01/02/2023   Hypertension    Osteoarthritis of both knees 12/05/2022   Osteoarthritis of right wrist 12/05/2022   Prediabetes    Symptomatic anemia 11/28/2020    Past Surgical History:  Procedure Laterality Date   BIOPSY  11/29/2020   Procedure: BIOPSY;  Surgeon: Avram Lupita BRAVO, MD;  Location: [facility] ENDOSCOPY;  Service: Endoscopy;;   CHOLECYSTECTOMY     ESOPHAGOGASTRODUODENOSCOPY (EGD) WITH PROPOFOL  N/A 11/29/2020   Procedure: ESOPHAGOGASTRODUODENOSCOPY (EGD) WITH PROPOFOL ;  Surgeon: Avram Lupita BRAVO, MD;  Location: [facility] ENDOSCOPY;  Service: Endoscopy;  Laterality: N/A;   HERNIA REPAIR      Family History  Problem Relation Age of Onset   Diabetes Mother    Hypertension Mother    Colon  polyps Neg Hx    Colon cancer Neg Hx    Stomach cancer Neg Hx    Esophageal cancer Neg Hx    Rectal cancer Neg Hx     Social History   Socioeconomic History   Marital status: Widowed    Spouse name: Not on file   Number of children: 2   Years of education: Not on file   Highest education level: Associate degree: academic program  Occupational History    Not on file  Tobacco Use   Smoking status: Never   Smokeless tobacco: Never  Vaping Use   Vaping status: Never Used  Substance and Sexual Activity   Alcohol use: No    Alcohol/week: 0.0 standard drinks of alcohol   Drug use: No   Sexual activity: Not Currently  Other Topics Concern   Not on file  Social History Narrative   Lives home alone.    Social Drivers of Health   Tobacco Use: Low Risk (12/13/2023)   Patient History    Smoking Tobacco Use: Never    Smokeless Tobacco Use: Never    Passive Exposure: Not on file  Financial Resource Strain: Medium Risk (03/17/2024)   Overall Financial Resource Strain (CARDIA)    Difficulty of Paying Living Expenses: Somewhat hard  Food Insecurity: No Food Insecurity (03/17/2024)   Epic    Worried About Programme Researcher, Broadcasting/film/video in the Last Year: Never true    Ran Out of Food in the Last Year: Never true  Transportation Needs: No Transportation Needs (03/17/2024)   Epic    Lack of Transportation (Medical): No    Lack of Transportation (Non-Medical): No  Physical Activity: Insufficiently Active (03/17/2024)   Exercise Vital Sign    Days of Exercise per Week: 2 days    Minutes of Exercise per Session: 30 min  Stress: No Stress Concern Present (03/17/2024)   [employer] of Occupational Health - Occupational Stress Questionnaire    Feeling of Stress: Only a little  Social Connections: Socially Isolated (03/17/2024)   Social Connection and Isolation Panel    Frequency of Communication with Friends and Family: Twice a week    Frequency of Social Gatherings with Friends and Family: Never    Attends Religious Services: Never    Database Administrator or Organizations: No    Attends Engineer, Structural: Not on file    Marital Status: Widowed  Intimate Partner Violence: Not At Risk (03/18/2024)   Epic    Fear of Current or Ex-Partner: No    Emotionally Abused: No    Physically Abused: No    Sexually Abused: No  Depression (PHQ2-9): Low Risk  (03/18/2024)   Depression (PHQ2-9)    PHQ-2 Score: 0  Alcohol Screen: Low Risk (03/17/2024)   Alcohol Screen    Last Alcohol Screening Score (AUDIT): 0  Housing: Low Risk (03/17/2024)   Epic    Unable to Pay for Housing in the Last Year: No    Number of Times Moved in the Last Year: 0    Homeless in the Last Year: No  Utilities: Not At Risk (03/18/2024)   Epic    Threatened with loss of utilities: No  Health Literacy: Not on file    Outpatient Medications Prior to Visit  Medication Sig Dispense Refill   amLODipine  (NORVASC ) 10 MG tablet Take 1 tablet (10 mg total) by mouth daily. 90 tablet 1   atorvastatin  (LIPITOR) 10 MG tablet TAKE 1 TABLET BY MOUTH EVERY DAY  90 tablet 1   celecoxib (CELEBREX) 200 MG capsule Take 200 mg by mouth daily.     chlorthalidone  (HYGROTON ) 25 MG tablet Take 1 tablet (25 mg total) by mouth daily. 90 tablet 1   olmesartan  (BENICAR ) 40 MG tablet Take 1 tablet (40 mg total) by mouth daily. 90 tablet 1   cyclobenzaprine  (FLEXERIL ) 5 MG tablet Take 1 tablet (5 mg total) by mouth 3 (three) times daily as needed. (Patient not taking: Reported on 03/18/2024) 40 tablet 1   metFORMIN  (GLUCOPHAGE -XR) 500 MG 24 hr tablet Take 1 tablet (500 mg total) by mouth daily with breakfast. (Patient not taking: Reported on 12/13/2023) 30 tablet 3   pantoprazole  (PROTONIX ) 40 MG tablet Take 1 tablet (40 mg total) by mouth daily. (Patient not taking: Reported on 10/11/2023) 30 tablet 3   meloxicam  (MOBIC ) 7.5 MG tablet Take 1 tablet (7.5 mg total) by mouth daily. (Patient not taking: Reported on 10/11/2023) 30 tablet 0   phentermine  15 MG capsule TAKE 1 CAPSULE BY MOUTH EVERY DAY IN THE MORNING (Patient not taking: Reported on 03/18/2024) 30 capsule 0   No facility-administered medications prior to visit.    Allergies[1]  ROS Review of Systems  Constitutional:  Negative for appetite change, chills, fatigue and fever.  HENT:  Negative for congestion, postnasal drip, rhinorrhea and sneezing.    Respiratory:  Negative for cough, shortness of breath and wheezing.   Cardiovascular:  Negative for chest pain, palpitations and leg swelling.  Gastrointestinal:  Negative for abdominal pain, constipation, nausea and vomiting.  Genitourinary:  Negative for difficulty urinating, dysuria, flank pain and frequency.  Musculoskeletal:  Positive for arthralgias. Negative for joint swelling and myalgias.  Skin:  Negative for color change, pallor, rash and wound.  Neurological:  Negative for dizziness, facial asymmetry, weakness, numbness and headaches.  Psychiatric/Behavioral:  Negative for behavioral problems, confusion, self-injury and suicidal ideas.       Objective:    Physical Exam Vitals and nursing note reviewed.  Constitutional:      General: She is not in acute distress.    Appearance: Normal appearance. She is obese. She is not ill-appearing, toxic-appearing or diaphoretic.  Eyes:     General: No scleral icterus.       Right eye: No discharge.        Left eye: No discharge.     Extraocular Movements: Extraocular movements intact.     Conjunctiva/sclera: Conjunctivae normal.  Cardiovascular:     Rate and Rhythm: Normal rate and regular rhythm.     Pulses: Normal pulses.     Heart sounds: Normal heart sounds.  Pulmonary:     Effort: Pulmonary effort is normal. No respiratory distress.     Breath sounds: Normal breath sounds. No stridor. No wheezing, rhonchi or rales.  Chest:     Chest wall: No tenderness.  Abdominal:     General: There is no distension.     Palpations: Abdomen is soft.     Tenderness: There is no abdominal tenderness. There is no right CVA tenderness, left CVA tenderness or guarding.  Musculoskeletal:        General: Tenderness present. No swelling, deformity or signs of injury.     Right lower leg: No edema.     Left lower leg: No edema.     Comments: Left great toe tenderness on palpation no redness or swelling noted, skin is warm and dry  Skin:     General: Skin is warm and dry.  Capillary Refill: Capillary refill takes less than 2 seconds.     Coloration: Skin is not jaundiced or pale.     Findings: No bruising, erythema or lesion.  Neurological:     Mental Status: She is alert and oriented to person, place, and time.     Motor: No weakness.     Coordination: Coordination normal.     Gait: Gait normal.  Psychiatric:        Mood and Affect: Mood normal.        Behavior: Behavior normal.        Thought Content: Thought content normal.        Judgment: Judgment normal.     BP 131/62   Pulse 61   Wt 226 lb (102.5 kg)   SpO2 100%   BMI 37.61 kg/m  Wt Readings from Last 3 Encounters:  03/18/24 226 lb (102.5 kg)  12/13/23 217 lb (98.4 kg)  12/11/23 217 lb (98.4 kg)    No results found for: TSH Lab Results  Component Value Date   WBC 6.0 04/02/2023   HGB 14.5 04/02/2023   HCT 42.7 04/02/2023   MCV 89 04/02/2023   PLT 421 04/02/2023   Lab Results  Component Value Date   NA 140 10/11/2023   K 4.0 10/11/2023   CO2 23 10/11/2023   GLUCOSE 100 (H) 10/11/2023   BUN 9 10/11/2023   CREATININE 0.68 10/11/2023   BILITOT 0.4 10/11/2023   ALKPHOS 82 10/11/2023   AST 19 10/11/2023   ALT 21 10/11/2023   PROT 7.5 10/11/2023   ALBUMIN 4.5 10/11/2023   CALCIUM  9.9 10/11/2023   ANIONGAP 9 12/15/2022   EGFR 101 10/11/2023   Lab Results  Component Value Date   CHOL 135 10/11/2023   Lab Results  Component Value Date   HDL 48 10/11/2023   Lab Results  Component Value Date   LDLCALC 73 10/11/2023   Lab Results  Component Value Date   TRIG 68 10/11/2023   Lab Results  Component Value Date   CHOLHDL 2.8 10/11/2023   Lab Results  Component Value Date   HGBA1C 6.3 (A) 12/05/2022      Assessment & Plan:   Problem List Items Addressed This Visit       Cardiovascular and Mediastinum   Essential hypertension - Primary   BP Readings from Last 3 Encounters:  03/18/24 131/62  12/11/23 115/77  11/08/23  127/73   Essential hypertension Blood pressure well-controlled with current regimen. - Continue amlodipine  10 mg daily, Benicar  40 mg daily, and chlorthalidone  25 mg daily. Discussed DASH diet and dietary sodium restrictions Continue to increase dietary efforts and exercise.          Relevant Orders   CMP14+EGFR     Musculoskeletal and Integument   Bilateral primary osteoarthritis of knee   Continue Celebrex 200 mg daily and Tylenol  arthritis Followed by orthopedics      Relevant Medications   celecoxib (CELEBREX) 200 MG capsule     Other   Prediabetes   Avoid sugar sweets soda Lab Results  Component Value Date   HGBA1C 6.3 (A) 12/05/2022         Relevant Orders   Hemoglobin A1c   Morbid obesity (HCC)   Wt Readings from Last 3 Encounters:  03/18/24 226 lb (102.5 kg)  12/13/23 217 lb (98.4 kg)  12/11/23 217 lb (98.4 kg)   Body mass index is 37.61 kg/m.   Weight gain of 9 pounds. Current weight 226  pounds. Previous weight loss medications discontinued due to headaches. Limited physical activity due to arthritis. - Referred to medical weight management clinic. - Encouraged high protein, low carbohydrate diet. - Advised on portion control and hydration. - Encouraged moderate exercises as tolerated at least 250 minutes weekly       Relevant Orders   Amb Ref to Medical Weight Management   Dyslipidemia    Managed with atorvastatin . - Continue atorvastatin  10 mg daily.       Great toe pain, left   . Pain level 4-5/10. - Continue Celebrex 200 mg once daily for knee pain. - Advised wearing comfortable shoes. - Recommended Tylenol  650 mg every 6 hours as needed, avoid NSAIDs. - Ordered uric acid level to rule out gout. - Follow up with orthopedic doctor if no improvement.       Relevant Orders   Uric Acid   Other Visit Diagnoses       Screening mammogram for breast cancer       Relevant Orders   MM 3D SCREENING MAMMOGRAM BILATERAL BREAST       No  orders of the defined types were placed in this encounter.   Follow-up: Return in about 4 months (around 07/16/2024) for CPE.    Kayla Moody R Kenneth Lax, FNP     [1]  Allergies Allergen Reactions   Penicillins Anaphylaxis and Other (See Comments)    Family history of severe allergic reactions to this, so the patient does not attempt it. Tolerated amoxicillin  11/30/2020, no issues noted.    "

## 2024-03-18 NOTE — Assessment & Plan Note (Addendum)
 Wt Readings from Last 3 Encounters:  03/18/24 226 lb (102.5 kg)  12/13/23 217 lb (98.4 kg)  12/11/23 217 lb (98.4 kg)   Body mass index is 37.61 kg/m.   Weight gain of 9 pounds. Current weight 226 pounds. Previous weight loss medications discontinued due to headaches. Limited physical activity due to arthritis. - Referred to medical weight management clinic. - Encouraged high protein, low carbohydrate diet. - Advised on portion control and hydration. - Encouraged moderate exercises as tolerated at least 250 minutes weekly

## 2024-03-18 NOTE — Assessment & Plan Note (Signed)
 Managed with atorvastatin. - Continue atorvastatin 10 mg daily

## 2024-03-18 NOTE — Assessment & Plan Note (Signed)
 Avoid sugar sweets soda Lab Results  Component Value Date   HGBA1C 6.3 (A) 12/05/2022

## 2024-03-18 NOTE — Assessment & Plan Note (Signed)
 BP Readings from Last 3 Encounters:  03/18/24 131/62  12/11/23 115/77  11/08/23 127/73   Essential hypertension Blood pressure well-controlled with current regimen. - Continue amlodipine  10 mg daily, Benicar  40 mg daily, and chlorthalidone  25 mg daily. Discussed DASH diet and dietary sodium restrictions Continue to increase dietary efforts and exercise.

## 2024-03-19 LAB — CMP14+EGFR
ALT: 19 IU/L (ref 0–32)
AST: 17 IU/L (ref 0–40)
Albumin: 4.5 g/dL (ref 3.8–4.9)
Alkaline Phosphatase: 78 IU/L (ref 49–135)
BUN/Creatinine Ratio: 25 — ABNORMAL HIGH (ref 9–23)
BUN: 17 mg/dL (ref 6–24)
Bilirubin Total: 0.3 mg/dL (ref 0.0–1.2)
CO2: 27 mmol/L (ref 20–29)
Calcium: 9.7 mg/dL (ref 8.7–10.2)
Chloride: 100 mmol/L (ref 96–106)
Creatinine, Ser: 0.68 mg/dL (ref 0.57–1.00)
Globulin, Total: 2.7 g/dL (ref 1.5–4.5)
Glucose: 116 mg/dL — ABNORMAL HIGH (ref 70–99)
Potassium: 4 mmol/L (ref 3.5–5.2)
Sodium: 142 mmol/L (ref 134–144)
Total Protein: 7.2 g/dL (ref 6.0–8.5)
eGFR: 101 mL/min/1.73

## 2024-03-19 LAB — URIC ACID: Uric Acid: 6 mg/dL (ref 3.0–7.2)

## 2024-03-19 LAB — HEMOGLOBIN A1C
Est. average glucose Bld gHb Est-mCnc: 140 mg/dL
Hgb A1c MFr Bld: 6.5 % — ABNORMAL HIGH (ref 4.8–5.6)

## 2024-03-20 ENCOUNTER — Ambulatory Visit: Payer: Self-pay | Admitting: Nurse Practitioner

## 2024-03-20 DIAGNOSIS — E1165 Type 2 diabetes mellitus with hyperglycemia: Secondary | ICD-10-CM | POA: Insufficient documentation

## 2024-03-20 MED ORDER — METFORMIN HCL ER 500 MG PO TB24: 500.0000 mg | ORAL_TABLET | Freq: Every day | ORAL | 1 refills | Status: AC

## 2024-03-26 ENCOUNTER — Ambulatory Visit: Payer: Self-pay

## 2024-03-26 NOTE — Telephone Encounter (Signed)
FYI Kayla Moody

## 2024-03-26 NOTE — Telephone Encounter (Signed)
 FYI Only or Action Required?: Action required by provider: clinical question for provider and update on patient condition.  Patient was last seen in primary care on 03/18/2024 by Paseda, Folashade R, FNP.  Called Nurse Triage reporting Hyperglycemia.    Triage Disposition: Call PCP Now  Patient/caregiver understands and will follow disposition?: No, wishes to speak with PCP            Copied from CRM #[id number]. Topic: Clinical - Medical Advice >> Mar 26, 2024  9:51 AM Travis F wrote: Reason for CRM: Patient is calling in because when she went to the doctor her A1C was high. Patient says since then she has changed her diet, she fasts 16 hours a day, and only eats fruits and vegetables and protein. Patient says every morning her blood sugar is high and she doesn't understand why. Today her blood sugar was 180. She would like some advice on why it's so high in the morning. Reason for Disposition  [1] Caller requests to speak ONLY to PCP AND [2] URGENT question  Answer Assessment - Initial Assessment Questions 1. REASON FOR CALL or QUESTION: What is your reason for calling today? or How can I best    Patient calling into triage to report her blood sugar this morning was 180. She stated with her diet changes, her levels are still high. She is asymptomatic. She would like PCP to further advise on why this is occurring. Please advise.  Protocols used: PCP Call - No Triage-A-AH

## 2024-04-03 ENCOUNTER — Other Ambulatory Visit: Payer: Self-pay | Admitting: Nurse Practitioner

## 2024-04-03 DIAGNOSIS — E785 Hyperlipidemia, unspecified: Secondary | ICD-10-CM

## 2024-04-03 NOTE — Telephone Encounter (Signed)
 atorvastatin  (LIPITOR) 10 MG tablet [Pharmacy Med Name: ATORVASTATIN  10 MG TABLET]

## 2024-04-13 ENCOUNTER — Ambulatory Visit: Admitting: Nurse Practitioner

## 2024-04-15 ENCOUNTER — Ambulatory Visit: Admitting: Nurse Practitioner

## 2024-04-15 ENCOUNTER — Encounter: Payer: Self-pay | Admitting: Nurse Practitioner

## 2024-04-15 ENCOUNTER — Ambulatory Visit: Payer: Self-pay

## 2024-04-15 VITALS — BP 128/70 | HR 70 | Wt 224.0 lb

## 2024-04-15 DIAGNOSIS — M170 Bilateral primary osteoarthritis of knee: Secondary | ICD-10-CM | POA: Diagnosis not present

## 2024-04-15 DIAGNOSIS — I340 Nonrheumatic mitral (valve) insufficiency: Secondary | ICD-10-CM | POA: Diagnosis not present

## 2024-04-15 DIAGNOSIS — E1165 Type 2 diabetes mellitus with hyperglycemia: Secondary | ICD-10-CM

## 2024-04-15 DIAGNOSIS — R011 Cardiac murmur, unspecified: Secondary | ICD-10-CM | POA: Diagnosis not present

## 2024-04-15 DIAGNOSIS — E785 Hyperlipidemia, unspecified: Secondary | ICD-10-CM | POA: Diagnosis not present

## 2024-04-15 DIAGNOSIS — I10 Essential (primary) hypertension: Secondary | ICD-10-CM

## 2024-04-15 DIAGNOSIS — Z01818 Encounter for other preprocedural examination: Secondary | ICD-10-CM | POA: Diagnosis not present

## 2024-04-15 DIAGNOSIS — I359 Nonrheumatic aortic valve disorder, unspecified: Secondary | ICD-10-CM | POA: Diagnosis not present

## 2024-04-15 NOTE — Telephone Encounter (Signed)
 Pt has been contacted and worked in today at 10:20 am.

## 2024-04-15 NOTE — Assessment & Plan Note (Addendum)
 Bilateral  knee osteoarthritis Significant pain with difficulty using Celebrex, prefers Tylenol . Declined Toradol  injection. - Offered Toradol  injection for acute pain relief, declined.

## 2024-04-15 NOTE — Patient Instructions (Signed)
 1. Essential hypertension (Primary)  2. Type 2 diabetes mellitus with hyperglycemia, without long-term current use of insulin (HCC) - Microalbumin/Creatinine Ratio, Urine - Direct LDL - CBC  3. Dyslipidemia, goal LDL below 70 - Direct LDL  4. Pre-op evaluation    It is important that you exercise regularly at least 30 minutes 5 times a week as tolerated  Think about what you will eat, plan ahead. Choose  clean, green, fresh or frozen over canned, processed or packaged foods which are more sugary, salty and fatty. 70 to 75% of food eaten should be vegetables and fruit. Three meals at set times with snacks allowed between meals, but they must be fruit or vegetables. Aim to eat over a 12 hour period , example 7 am to 7 pm, and STOP after  your last meal of the day. Drink water,generally about 64 ounces per day, no other drink is as healthy. Fruit juice is best enjoyed in a healthy way, by EATING the fruit.  Thanks for choosing [facility] we consider it a privelige to serve you.

## 2024-04-15 NOTE — Assessment & Plan Note (Signed)
 Type 2 diabetes mellitus Diabetes well controlled with A1c of 6.5%. - Continue metformin  500 mg daily. - Advised low carb diet - Encouraged exercise as tolerated. - Ordered urine microalbumin test.  Ordered direct LDL test.

## 2024-04-15 NOTE — Assessment & Plan Note (Signed)
 BP Readings from Last 3 Encounters:  04/15/24 128/70  03/18/24 131/62  12/11/23 115/77   HTN Controlled .amlodipine  10mg , olmesartan , 40mg , hygroton  25mg  Continue current medications. No changes in management. Discussed DASH diet and dietary sodium restrictions Continue to increase dietary efforts and exercise.

## 2024-04-15 NOTE — Assessment & Plan Note (Signed)
 Echocardiogram done in 2022 showed aortic stenosis , mild mitral valve regurgitation there was a recommendation for monitoring with future echocardiogram.  Going for knee replacement surgery, echocardiogram ordered, referral to cardiology placed for preop evaluation and clearance

## 2024-04-15 NOTE — Assessment & Plan Note (Signed)
 Lab Results  Component Value Date   CHOL 135 10/11/2023   HDL 48 10/11/2023   LDLCALC 73 10/11/2023   TRIG 68 10/11/2023   CHOLHDL 2.8 10/11/2023    LDL is 73 mg/dL, managed with atorvastatin . - Continue atorvastatin  10 mg daily. - Ordered direct LDL test.

## 2024-04-15 NOTE — Assessment & Plan Note (Signed)
 Echocardiogram done in 2022 showed aortic stenosis , mild mitral valve regurgitation there was a recommendation for monitoring with future echocardiogram.  Going for replacement surgery, echocardiogram ordered, referral to cardiology placed for preop evaluation and clearance

## 2024-04-15 NOTE — Progress Notes (Signed)
 "  Acute Office Visit  Subjective:     Patient ID: Kayla Moody, female    DOB: [birth year], 59 y.o.   MRN: [id number]  Chief Complaint  Patient presents with   Knee Pain    HPI   Discussed the use of AI scribe software for clinical note transcription with the patient, who gave verbal consent to proceed.  History of Present Illness Kayla Moody is a 59 year old female  has a past medical history of Chronic tension-type headache, intractable (10/22/2016), Class 2 severe obesity due to excess calories with serious comorbidity and body mass index (BMI) of 38.0 to 38.9 in adult (01/02/2023), Essential hypertension (06/28/2016), H. pylori duodenitis (12/02/2020), Helicobacter pylori gastritis (11/30/2020), History of hypercholesterolemia (01/02/2023), Hypertension, Osteoarthritis of both knees (12/05/2022), Osteoarthritis of right wrist (12/05/2022), Prediabetes, and Symptomatic anemia (11/28/2020).  who presents for surgical clearance for left knee surgery.  She is experiencing significant left knee pain, stating 'I can barely walk'. She has been using Celebrex for pain management, but it is not effective. Recently, she has also been taking Tylenol  for additional pain relief. She visited orthopedics two weeks ago and is awaiting a surgery date.  She has a history of diabetes, which is well controlled with a recent A1c of 6.5. She takes metformin  500 mg daily and monitors her blood sugar levels every morning, which fluctuate between 88 and 112. She is mindful of her diet, avoiding sugar, sweet sodas, chips, and cookies, and consumes green tea, broccoli, and asparagus. Her blood pressure is well controlled at 128/70. She is on atorvastatin  10 mg daily for cholesterol management, with a recent LDL of 73. She reports difficulty losing weight despite eating healthy and previously lost weight on Wegovy .  No fever, chills, chest pain, or shortness of breath. She reports pain in her knee and toes. No  wounds or ulcers on her feet.     Assessment & Plan     Review of Systems  Constitutional:  Negative for appetite change, chills, fatigue and fever.  HENT:  Negative for congestion, postnasal drip, rhinorrhea and sneezing.   Respiratory:  Negative for cough, shortness of breath and wheezing.   Cardiovascular:  Negative for chest pain, palpitations and leg swelling.  Gastrointestinal:  Negative for abdominal pain, constipation, nausea and vomiting.  Genitourinary:  Negative for difficulty urinating, dysuria, flank pain and frequency.  Musculoskeletal:  Positive for arthralgias. Negative for back pain, joint swelling and myalgias.  Skin:  Negative for color change, pallor, rash and wound.  Neurological:  Negative for facial asymmetry, weakness, numbness and headaches.  Psychiatric/Behavioral:  Negative for behavioral problems, confusion, self-injury and suicidal ideas.         Objective:    BP 128/70   Pulse 70   Wt 224 lb (101.6 kg)   SpO2 100%   BMI 37.28 kg/m    Physical Exam Vitals and nursing note reviewed.  Constitutional:      General: She is not in acute distress.    Appearance: Normal appearance. She is obese. She is not ill-appearing, toxic-appearing or diaphoretic.  Eyes:     General: No scleral icterus.       Right eye: No discharge.        Left eye: No discharge.     Extraocular Movements: Extraocular movements intact.     Conjunctiva/sclera: Conjunctivae normal.  Cardiovascular:     Rate and Rhythm: Normal rate and regular rhythm.     Pulses: Normal pulses.  Heart sounds: Normal heart sounds. No murmur heard.    No friction rub. No gallop.  Pulmonary:     Effort: Pulmonary effort is normal. No respiratory distress.     Breath sounds: Normal breath sounds. No stridor. No wheezing, rhonchi or rales.  Chest:     Chest wall: No tenderness.  Abdominal:     General: There is no distension.     Palpations: Abdomen is soft.     Tenderness: There is no  abdominal tenderness. There is no right CVA tenderness, left CVA tenderness or guarding.  Musculoskeletal:        General: Tenderness present. No swelling, deformity or signs of injury.     Right lower leg: No edema.     Left lower leg: No edema.     Comments: Left  knee tenderness   Skin:    General: Skin is warm and dry.     Capillary Refill: Capillary refill takes less than 2 seconds.     Coloration: Skin is not jaundiced or pale.     Findings: No bruising, erythema or lesion.  Neurological:     Mental Status: She is alert and oriented to person, place, and time.     Motor: No weakness.     Coordination: Coordination normal.     Gait: Gait normal.  Psychiatric:        Mood and Affect: Mood normal.        Behavior: Behavior normal.        Thought Content: Thought content normal.        Judgment: Judgment normal.     No results found for any visits on 04/15/24.      Assessment & Plan:   Problem List Items Addressed This Visit       Cardiovascular and Mediastinum   Essential hypertension - Primary   BP Readings from Last 3 Encounters:  04/15/24 128/70  03/18/24 131/62  12/11/23 115/77   HTN Controlled .amlodipine  10mg , olmesartan , 40mg , hygroton  25mg  Continue current medications. No changes in management. Discussed DASH diet and dietary sodium restrictions Continue to increase dietary efforts and exercise.          Mild mitral valve regurgitation   Echocardiogram done in 2022 showed aortic stenosis , mild mitral valve regurgitation there was a recommendation for monitoring with future echocardiogram.  Going for a knee replacement surgery, echocardiogram ordered, referral to cardiology placed for preop evaluation and clearance      Relevant Orders   ECHOCARDIOGRAM COMPLETE   Ambulatory referral to Cardiology   Aortic valve calcification   Echocardiogram done in 2022 showed aortic stenosis , mild mitral valve regurgitation there was a recommendation for  monitoring with future echocardiogram.  Going for knee replacement surgery, echocardiogram ordered, referral to cardiology placed for preop evaluation and clearance      Relevant Orders   ECHOCARDIOGRAM COMPLETE   Ambulatory referral to Cardiology     Endocrine   Type 2 diabetes mellitus with hyperglycemia, without long-term current use of insulin (HCC)   Type 2 diabetes mellitus Diabetes well controlled with A1c of 6.5%. - Continue metformin  500 mg daily. - Advised low carb diet - Encouraged exercise as tolerated. - Ordered urine microalbumin test.  Ordered direct LDL test.         Relevant Orders   Microalbumin/Creatinine Ratio, Urine   Direct LDL   CBC   Ambulatory referral to Ophthalmology     Musculoskeletal and Integument   Primary localized osteoarthritis of knees,  bilateral   Bilateral  knee osteoarthritis Significant pain with difficulty using Celebrex, prefers Tylenol . Declined Toradol  injection. - Offered Toradol  injection for acute pain relief, declined.        Other   Heart murmur   Echocardiogram done in 2022 showed aortic stenosis , mild mitral valve regurgitation there was a recommendation for monitoring with future echocardiogram.  Going for replacement surgery, echocardiogram ordered, referral to cardiology placed for preop evaluation and clearance      Relevant Orders   ECHOCARDIOGRAM COMPLETE   Morbid obesity (HCC)   Wt Readings from Last 3 Encounters:  04/15/24 224 lb (101.6 kg)  03/18/24 226 lb (102.5 kg)  12/13/23 217 lb (98.4 kg)   Body mass index is 37.28 kg/m.   Class 2 obesity with difficulty losing weight despite healthy eating habits. Limited insurance coverage for weight loss medications. - Encouraged healthy eating and exercise as tolerated.      Pre-op evaluation   Preoperative evaluation for left knee arthroplasty Blood pressure and diabetes is currently well-controlled Needs cardiology clearance due to history of aortic  stenosis and mitral valve regurgitation Echocardiogram ordered, referral to cardiology placed       Dyslipidemia, goal LDL below 70   Lab Results  Component Value Date   CHOL 135 10/11/2023   HDL 48 10/11/2023   LDLCALC 73 10/11/2023   TRIG 68 10/11/2023   CHOLHDL 2.8 10/11/2023    LDL is 73 mg/dL, managed with atorvastatin . - Continue atorvastatin  10 mg daily. - Ordered direct LDL test.       Relevant Orders   Direct LDL    No orders of the defined types were placed in this encounter.   No follow-ups on file.  Marv Alfrey R Inika Bellanger, FNP  "

## 2024-04-15 NOTE — Assessment & Plan Note (Addendum)
 Echocardiogram done in 2022 showed aortic stenosis , mild mitral valve regurgitation there was a recommendation for monitoring with future echocardiogram.  Going for a knee replacement surgery, echocardiogram ordered, referral to cardiology placed for preop evaluation and clearance

## 2024-04-15 NOTE — Telephone Encounter (Signed)
 She was supposed to be seen on 04/13/2024 for knee surgery surgical clearance and that had to be rescheduled for 05/06/2024 Patient wanted to see if she can be seen sooner for this due to worsening pain and not wanting to push her knee surgery back longer  [phone] is patient's contact number    FYI Only or Action Required?: Action required by provider: request for appointment, clinical question for provider, and update on patient condition.  Patient was last seen in primary care on 03/18/2024 by Paseda, Folashade R, FNP.  Called Nurse Triage reporting Knee Pain.  Symptoms began ongoing issue---patient states she is supposed to have knee surgery.  Interventions attempted: Prescription medications: Celebrex, Rest, hydration, or home remedies, and Ice/heat application.  Symptoms are: gradually worsening.  Triage Disposition: See HCP Within 4 Hours (Or PCP Triage)  Patient/caregiver understands and will follow disposition?: No, wishes to speak with PCP    Message from Antwanette L sent at 04/15/2024  8:35 AM EST  Reason for Triage: Pt reports pain in both knees and states she can barely walk. She has an appt on 2/25 but would like to be seen sooner.   Reason for Disposition  [1] SEVERE pain (e.g., excruciating, unable to walk) AND [2] not improved after 2 hours of pain medicine  Answer Assessment - Initial Assessment Questions Patient called and advised that she has arthritis in both knees She was advised that she has to go to her PCP for clearance  She states she needs to have surgery on both knees but  Left knee surgery will be first due to that one being worse is what she was told  Patient has been using knee braces, ice, and medications ---pain has gotten worse  She was supposed to be seen on 04/13/2024 & was rescheduled for 05/06/2024 due to the recent inclement weather  [phone] is patient's contact number  Patient wants to be seen sooner for surgical clearance due to  her pain worsening She is currently working at this time and wants a call back on if she is able to be seen sooner and fit into the schedule.   Patient is advised to call us  back if anything changes or with any further questions/concerns. Patient is advised that if anything worsens to go to the Emergency Room. Patient verbalized understanding.  Protocols used: Knee Pain-A-AH

## 2024-04-15 NOTE — Assessment & Plan Note (Addendum)
 Preoperative evaluation for left knee arthroplasty Blood pressure and diabetes is currently well-controlled Needs cardiology clearance due to history of aortic stenosis and mitral valve regurgitation Echocardiogram ordered, referral to cardiology placed

## 2024-04-15 NOTE — Assessment & Plan Note (Signed)
 Wt Readings from Last 3 Encounters:  04/15/24 224 lb (101.6 kg)  03/18/24 226 lb (102.5 kg)  12/13/23 217 lb (98.4 kg)   Body mass index is 37.28 kg/m.   Class 2 obesity with difficulty losing weight despite healthy eating habits. Limited insurance coverage for weight loss medications. - Encouraged healthy eating and exercise as tolerated.

## 2024-04-16 LAB — MICROALBUMIN / CREATININE URINE RATIO
Creatinine, Urine: 88.8 mg/dL
Microalb/Creat Ratio: 4 mg/g{creat} (ref 0–29)
Microalbumin, Urine: 3.5 ug/mL

## 2024-04-16 LAB — CBC
Hematocrit: 41 % (ref 34.0–46.6)
Hemoglobin: 13.9 g/dL (ref 11.1–15.9)
MCH: 31 pg (ref 26.6–33.0)
MCHC: 33.9 g/dL (ref 31.5–35.7)
MCV: 92 fL (ref 79–97)
Platelets: 350 10*3/uL (ref 150–450)
RBC: 4.48 x10E6/uL (ref 3.77–5.28)
RDW: 11.7 % (ref 11.7–15.4)
WBC: 5.3 10*3/uL (ref 3.4–10.8)

## 2024-04-16 LAB — LDL CHOLESTEROL, DIRECT: LDL Direct: 92 mg/dL (ref 0–99)

## 2024-04-17 ENCOUNTER — Ambulatory Visit: Admitting: Internal Medicine

## 2024-04-17 ENCOUNTER — Ambulatory Visit: Payer: Self-pay | Admitting: Nurse Practitioner

## 2024-04-17 ENCOUNTER — Encounter: Payer: Self-pay | Admitting: Internal Medicine

## 2024-04-17 ENCOUNTER — Telehealth: Payer: Self-pay | Admitting: Nurse Practitioner

## 2024-04-17 VITALS — BP 109/79 | HR 70 | Ht 65.0 in | Wt 225.8 lb

## 2024-04-17 DIAGNOSIS — I350 Nonrheumatic aortic (valve) stenosis: Secondary | ICD-10-CM

## 2024-04-17 DIAGNOSIS — E785 Hyperlipidemia, unspecified: Secondary | ICD-10-CM

## 2024-04-17 DIAGNOSIS — Z01810 Encounter for preprocedural cardiovascular examination: Secondary | ICD-10-CM

## 2024-04-17 DIAGNOSIS — E1169 Type 2 diabetes mellitus with other specified complication: Secondary | ICD-10-CM

## 2024-04-17 DIAGNOSIS — E1165 Type 2 diabetes mellitus with hyperglycemia: Secondary | ICD-10-CM

## 2024-04-17 DIAGNOSIS — I340 Nonrheumatic mitral (valve) insufficiency: Secondary | ICD-10-CM

## 2024-04-17 MED ORDER — ATORVASTATIN CALCIUM 20 MG PO TABS: 20.0000 mg | ORAL_TABLET | Freq: Every day | ORAL | 1 refills | Status: AC

## 2024-04-17 NOTE — Patient Instructions (Signed)
 Medication Instructions:  Your physician recommends that you continue on your current medications as directed. Please refer to the Current Medication list given to you today.  *If you need a refill on your cardiac medications before your next appointment, please call your pharmacy*   Follow-Up: At [facility], you and your health needs are our priority.  As part of our continuing mission to provide you with exceptional heart care, our providers are all part of one team.  This team includes your primary Cardiologist (physician) and Advanced Practice Providers or APPs (Physician Assistants and Nurse Practitioners) who all work together to provide you with the care you need, when you need it.  Your next appointment:   6 month(s)  Provider:   Emeline FORBES Calender, DO or One of our Advanced Practice Providers (APPs): Morse Clause, PA-C  Hanh Waddell Daniels, PA-C  Saddie Cleaves, NP  Olivia Pavy, PA-C Miriam Shams, NP  Leontine Salen, PA-C Josefa Beauvais, NP  [facility], PA-C [place], PA-C  [place], PA-C Hunter Davis, [place]  Damien Braver, NP Jon Hails, PA-C  Waddell Donath, PA-C Dayna Dunn, PA-C  [place], PA-C Madison Fountain, NP Glendia Ferrier, PA-C Callie Goodrich, PA-C  Katlyn West, NP Thom Sluder, PA-C  Alyssa White, NP Rollo Louder, PA-C Xika Zhao, NP    Lamarr Satterfield, NP

## 2024-04-17 NOTE — Telephone Encounter (Signed)
 Copied from CRM [id number]. Topic: Clinical - Medical Advice >> Apr 17, 2024  1:53 PM Kayla Moody wrote: Reason for CRM: Pt called requesting follow up information regarding her EKG, she is not even sure herself. Says this is regarding surgery.   Best contact: [phone]

## 2024-04-17 NOTE — Progress Notes (Signed)
 " Cardiology Office Note:  .   Date:  04/17/2024  ID:  Kayla Moody, DOB 19-Mar-1965, MRN [id number] PCP: Paseda, Folashade R, FNP  [facility] Providers Cardiologist:  Emeline FORBES Calender, DO    History of Present Illness: .     Discussed the use of AI scribe software for clinical note transcription with the patient, who gave verbal consent to proceed.  History of Present Illness Kayla Moody is a 59 year old female who presents for preoperative evaluation for knee replacement surgery.  Preoperative assessment for knee replacement surgery - Scheduled for knee replacement surgery; date pending completion of preoperative evaluations - Surgery anticipated this month or at the beginning of next month - Not on insulin.  No history of stroke.  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 - No chest pain with exertion, including lifting five-gallon water jugs at home - Shortness of breath when climbing stairs quickly; no shortness of breath when climbing stairs at a slower pace - No chest pain associated with exertional activities  Metabolic and cardiovascular risk factors - Type 2 diabetes - Dyslipidemia - Morbid obesity - Lipid panel on October 11, 2023: total cholesterol 135, HDL 48, LDL 73, triglycerides 68 - Current medications: amlodipine  10 mg, atorvastatin  20 mg, chlorthalidone  25 mg, olmesartan  40 mg  Musculoskeletal symptoms - Osteoarthritis - Pain in right big toe; gout testing negative - No numbness or tingling in  fingers or legs          ROS: Remaining review of systems negative  Studies Reviewed: SABRA   EKG Interpretation Date/Time:  Friday April 17 2024 13:22:17 EST Ventricular Rate:  70 PR Interval:  168 QRS Duration:  74 QT Interval:  400 QTC Calculation: 432 R Axis:   81  Text Interpretation: Normal sinus rhythm Low voltage QRS Nonspecific ST abnormality When compared with ECG of 15-Dec-2022 09:43, No significant change since Confirmed by Calender Emeline [phone]) on 04/17/2024 1:25:32 PM    Results Labs Lipid panel (10/11/2023): Total cholesterol 135, HDL 48, LDL 73, triglycerides 68  Diagnostic Echocardiogram (11/29/2020): Mild mitral regurgitation, mild aortic stenosis, left ventricular ejection fraction 60-65%, mild left ventricular hypertrophy, grade 1 diastolic dysfunction Risk Assessment/Calculations:             Physical Exam:   VS:  BP 109/79 (BP Location: Left Arm, Patient Position: Sitting, Cuff Size: Large)   Pulse 70   Ht 5' 5 (1.651 m)   Wt 225 lb 12.8 oz (102.4 kg)   SpO2 98%   BMI 37.58 kg/m    Wt Readings from Last 3 Encounters:  04/17/24 225 lb 12.8 oz (102.4 kg)  04/15/24 224 lb (101.6 kg)  03/18/24 226 lb (102.5 kg)    GEN: Well nourished, well developed in no acute distress NECK: No JVD; No carotid bruits CARDIAC:  RRR, 2/6 systolic murmur with S1 and S2 auscultated, no rubs, no gallops RESPIRATORY:  Clear to auscultation without rales, wheezing or rhonchi  ABDOMEN: Soft, non-tender, non-distended EXTREMITIES:  No edema; No deformity   ASSESSMENT AND PLAN: .  Assessment and Plan Assessment & Plan Preoperative cardiovascular evaluation for knee replacement surgery RCRI: 0.  Able to tolerate greater than 4 METS.  Physical exam not indicative of progression to severe aortic stenosis. - Patient is at low perioperative cardiovascular risk for the anticipated intermediate risk procedure.  No further preoperative cardiovascular evaluation required.  Echo  ordered and does not need to be done prior to surgery  Mild mitral valve regurgitation No significant symptoms. Condition monitored. - Continue monitoring   Mild aortic valve stenosis with calcification Noted on echo in 2022 without follow-up echo.  Physical exam not indicative of progression to severe aortic stenosis as S1 and S2 are auscultated.  Heart failure symptoms.  May have a genetic component of the aortic stenosis as it sounds like her mother also was being worked up for an AVR prior to her passing 2 years ago - Echocardiogram ordered by PCP.  This does not need to be done preoperatively.  Would appreciate if results could be sent over to our office when obtained  Grade 1 diastolic dysfunction with mild left ventricular hypertrophy Likely related to hypertension and/or obesity however possible infiltrative cardiomyopathy as her ECG is low voltage and echo shows aortic stenosis with LVH up to 1.5 cm of the posterior wall.  Does not have amyloid symptoms such as bilateral carpal tunnel or radiculopathy and does not have anemia or CKD. No significant symptoms. - Echocardiogram ordered.  Hopefully can be done with strain evaluation.  Can consider amyloid screening pending echo results  Dyslipidemia  Morbid obesity            Follow up: 6 months with myself or APP  Signed, Emeline FORBES Calender, DO  04/17/2024 1:41 PM    [facility] "

## 2024-04-22 ENCOUNTER — Ambulatory Visit: Payer: Self-pay | Admitting: Nurse Practitioner

## 2024-05-06 ENCOUNTER — Ambulatory Visit: Admitting: Nurse Practitioner

## 2024-05-27 ENCOUNTER — Ambulatory Visit

## 2024-06-24 ENCOUNTER — Encounter: Payer: Self-pay | Admitting: Nurse Practitioner
